# Patient Record
Sex: Male | Born: 1955 | Race: White | Hispanic: No | Marital: Married | Smoking: Former smoker
Health system: Southern US, Community
[De-identification: ages and names within clinical notes are randomized; demographics above are authoritative.]

## PROBLEM LIST (undated history)

## (undated) DIAGNOSIS — E119 Type 2 diabetes mellitus without complications: Secondary | ICD-10-CM

## (undated) DIAGNOSIS — I219 Acute myocardial infarction, unspecified: Secondary | ICD-10-CM

## (undated) DIAGNOSIS — I2510 Atherosclerotic heart disease of native coronary artery without angina pectoris: Secondary | ICD-10-CM

## (undated) DIAGNOSIS — I509 Heart failure, unspecified: Secondary | ICD-10-CM

## (undated) DIAGNOSIS — M1990 Unspecified osteoarthritis, unspecified site: Secondary | ICD-10-CM

## (undated) DIAGNOSIS — G4730 Sleep apnea, unspecified: Secondary | ICD-10-CM

## (undated) DIAGNOSIS — E785 Hyperlipidemia, unspecified: Secondary | ICD-10-CM

## (undated) DIAGNOSIS — K219 Gastro-esophageal reflux disease without esophagitis: Secondary | ICD-10-CM

## (undated) DIAGNOSIS — I4891 Unspecified atrial fibrillation: Secondary | ICD-10-CM

## (undated) DIAGNOSIS — I10 Essential (primary) hypertension: Secondary | ICD-10-CM

## (undated) DIAGNOSIS — E669 Obesity, unspecified: Secondary | ICD-10-CM

## (undated) HISTORY — DX: Essential (primary) hypertension: I10

## (undated) HISTORY — DX: Gastro-esophageal reflux disease without esophagitis: K21.9

## (undated) HISTORY — DX: Acute myocardial infarction, unspecified: I21.9

## (undated) HISTORY — PX: CARDIAC CATHETERIZATION: SHX172

## (undated) HISTORY — DX: Sleep apnea, unspecified: G47.30

## (undated) HISTORY — PX: CORONARY STENT PLACEMENT: SHX1402

## (undated) HISTORY — DX: Atherosclerotic heart disease of native coronary artery without angina pectoris: I25.10

## (undated) HISTORY — DX: Type 2 diabetes mellitus without complications: E11.9

## (undated) HISTORY — DX: Hyperlipidemia, unspecified: E78.5

## (undated) HISTORY — DX: Obesity, unspecified: E66.9

---

## 2003-12-26 ENCOUNTER — Inpatient Hospital Stay (HOSPITAL_COMMUNITY): Admission: EM | Admit: 2003-12-26 | Discharge: 2004-01-01 | Payer: Self-pay | Admitting: Cardiology

## 2003-12-29 ENCOUNTER — Encounter: Payer: Self-pay | Admitting: Cardiology

## 2004-11-13 ENCOUNTER — Ambulatory Visit: Payer: Self-pay | Admitting: Cardiology

## 2005-06-06 ENCOUNTER — Ambulatory Visit: Payer: Self-pay | Admitting: Cardiology

## 2006-02-11 ENCOUNTER — Ambulatory Visit: Payer: Self-pay | Admitting: Cardiology

## 2007-02-16 ENCOUNTER — Ambulatory Visit: Payer: Self-pay | Admitting: Cardiology

## 2007-02-16 LAB — CONVERTED CEMR LAB
Alkaline Phosphatase: 22 units/L — ABNORMAL LOW (ref 39–117)
BUN: 20 mg/dL (ref 6–23)
Chloride: 103 meq/L (ref 96–112)
Cholesterol: 165 mg/dL (ref 0–200)
Creatinine, Ser: 1.6 mg/dL — ABNORMAL HIGH (ref 0.4–1.5)
GFR calc Af Amer: 59 mL/min
Potassium: 4.4 meq/L (ref 3.5–5.1)
Sodium: 140 meq/L (ref 135–145)
Total Protein: 7 g/dL (ref 6.0–8.3)
Triglycerides: 188 mg/dL — ABNORMAL HIGH (ref 0–149)

## 2007-03-16 ENCOUNTER — Ambulatory Visit: Payer: Self-pay

## 2007-07-16 ENCOUNTER — Ambulatory Visit: Payer: Self-pay | Admitting: Cardiology

## 2007-08-06 ENCOUNTER — Ambulatory Visit: Payer: Self-pay | Admitting: Cardiology

## 2008-05-23 ENCOUNTER — Ambulatory Visit: Payer: Self-pay | Admitting: Cardiology

## 2008-06-03 DIAGNOSIS — E785 Hyperlipidemia, unspecified: Secondary | ICD-10-CM | POA: Insufficient documentation

## 2008-06-03 DIAGNOSIS — E119 Type 2 diabetes mellitus without complications: Secondary | ICD-10-CM | POA: Insufficient documentation

## 2008-06-03 DIAGNOSIS — I2510 Atherosclerotic heart disease of native coronary artery without angina pectoris: Secondary | ICD-10-CM | POA: Insufficient documentation

## 2008-06-03 DIAGNOSIS — I10 Essential (primary) hypertension: Secondary | ICD-10-CM

## 2008-06-03 DIAGNOSIS — K219 Gastro-esophageal reflux disease without esophagitis: Secondary | ICD-10-CM

## 2008-10-04 ENCOUNTER — Ambulatory Visit: Payer: Self-pay | Admitting: Cardiology

## 2008-10-27 ENCOUNTER — Telehealth (INDEPENDENT_AMBULATORY_CARE_PROVIDER_SITE_OTHER): Payer: Self-pay | Admitting: *Deleted

## 2008-11-10 ENCOUNTER — Telehealth (INDEPENDENT_AMBULATORY_CARE_PROVIDER_SITE_OTHER): Payer: Self-pay | Admitting: *Deleted

## 2008-11-14 ENCOUNTER — Ambulatory Visit: Payer: Self-pay | Admitting: Cardiology

## 2008-12-01 ENCOUNTER — Telehealth (INDEPENDENT_AMBULATORY_CARE_PROVIDER_SITE_OTHER): Payer: Self-pay | Admitting: Radiology

## 2008-12-05 ENCOUNTER — Encounter: Payer: Self-pay | Admitting: Cardiology

## 2008-12-05 ENCOUNTER — Ambulatory Visit: Payer: Self-pay

## 2008-12-05 ENCOUNTER — Ambulatory Visit: Payer: Self-pay | Admitting: Cardiology

## 2008-12-07 ENCOUNTER — Encounter: Payer: Self-pay | Admitting: Cardiology

## 2008-12-07 LAB — CONVERTED CEMR LAB
ALT: 22 units/L (ref 0–53)
AST: 24 units/L (ref 0–37)
Albumin: 3.8 g/dL (ref 3.5–5.2)
Alkaline Phosphatase: 24 units/L — ABNORMAL LOW (ref 39–117)
Bilirubin, Direct: 0.2 mg/dL (ref 0.0–0.3)
HDL: 28.5 mg/dL — ABNORMAL LOW (ref >39.00)
Potassium: 4.4 meq/L (ref 3.5–5.1)
Total Bilirubin: 0.9 mg/dL (ref 0.3–1.2)
Total CHOL/HDL Ratio: 5
Total Protein: 7.5 g/dL (ref 6.0–8.3)

## 2008-12-22 DIAGNOSIS — G4730 Sleep apnea, unspecified: Secondary | ICD-10-CM

## 2008-12-22 DIAGNOSIS — R609 Edema, unspecified: Secondary | ICD-10-CM

## 2008-12-22 DIAGNOSIS — R0602 Shortness of breath: Secondary | ICD-10-CM

## 2009-07-19 ENCOUNTER — Telehealth: Payer: Self-pay | Admitting: Cardiology

## 2009-08-22 ENCOUNTER — Encounter (INDEPENDENT_AMBULATORY_CARE_PROVIDER_SITE_OTHER): Payer: Self-pay | Admitting: *Deleted

## 2009-09-04 ENCOUNTER — Telehealth: Payer: Self-pay | Admitting: Cardiology

## 2009-12-06 ENCOUNTER — Telehealth: Payer: Self-pay | Admitting: Cardiology

## 2010-02-05 ENCOUNTER — Ambulatory Visit: Payer: Self-pay | Admitting: Cardiology

## 2010-03-02 ENCOUNTER — Telehealth: Payer: Self-pay | Admitting: Cardiology

## 2010-04-26 ENCOUNTER — Telehealth: Payer: Self-pay | Admitting: Cardiology

## 2010-05-04 ENCOUNTER — Ambulatory Visit: Payer: Self-pay | Admitting: Cardiology

## 2010-05-04 DIAGNOSIS — R079 Chest pain, unspecified: Secondary | ICD-10-CM

## 2010-05-07 ENCOUNTER — Telehealth: Payer: Self-pay | Admitting: Cardiology

## 2010-07-25 ENCOUNTER — Telehealth: Payer: Self-pay | Admitting: Cardiology

## 2010-07-31 ENCOUNTER — Ambulatory Visit: Admit: 2010-07-31 | Payer: Self-pay | Admitting: Cardiology

## 2010-08-28 NOTE — Assessment & Plan Note (Signed)
Summary: rov/ gd   History of Present Illness: 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  I last saw him in March of 2010.  Last Myoview in May of 2010 showed an ejection fraction of 44%. There was a prior anterior infarct with trivial peri-infarct ischemia. Echocardiogram May 2010 was technically difficult. The LV function was felt to be 65-70%. Since he was last seen the patient denies any dyspnea on exertion, orthopnea, PND, pedal edema, palpitations, syncope or chest pain.   Current Medications (verified): 1)  Avapro 150 Mg Tabs (Irbesartan) .... Take 1 Tablet By Mouth Once A Day 2)  Crestor 40 Mg Tabs (Rosuvastatin Calcium) .... Take One Tablet By Mouth Daily. 3)  Nitroglycerin 0.4 Mg Subl (Nitroglycerin) .... Place 1 Tablet Under Tongue As Directed 4)  Toprol Xl 25 Mg Xr24h-Tab (Metoprolol Succinate) .... Take 1 Tablet By Mouth Once A Day 5)  Tricor 145 Mg Tabs (Fenofibrate) .... Take 1 Tablet By Mouth Once A Day 6)  Metformin Hcl 500 Mg Tabs (Metformin Hcl) .... 2 Tabs By Mouth Two Times A Day 7)  Aspirin Ec 325 Mg Tbec (Aspirin) .... Take One Tablet By Mouth Daily 8)  Novolin 70/30 70-30 % Susp (Insulin Isophane & Regular) .... 30 Units Two Times A Day 9)  Novolin N 100 Unit/ml Susp (Insulin Isophane Human) .... Sliding Scale  Allergies: 1)  ! * Plavix 2)  Codeine  Past History:  Past Medical History: CAD (ICD-414.00) GERD (ICD-530.81) DM (ICD-250.00) HYPERLIPIDEMIA (ICD-272.4) HYPERTENSION (ICD-401.9) SLEEP APNEA (ICD-780.57) OBESITY (ICD-278.00) H/O plavix allergy  Past Surgical History: Reviewed history from 12/22/2008 and no changes required.  Dr. Gerri Spore performed drug-  eluting stent placement in the culprit lesion of the LAD.  He also had a  severe stenosis of the proximal RCA.  Patient now returns for planned staged  intervention. 12/30/2003   Left heart  catheterization with coronary angiography, leftventriculography, and abdominal aortography.  Percutaneous transluminal coronary angioplasty with stent placement x2 in  the proximal and mid-left anterior descending artery.Carole Binning, M.D. [facility]  RESULTS:  Successful percutaneous transluminal coronary angioplasty with stent placement x2 in the proximal and mid-left anterior descending artery. A 95% stenosis in the proximal vessel with probable thrombus and a 90%  stenosis in the midvessel were both reduced to 0% residual with TIMI-3 flow.  Social History: Reviewed history from 12/22/2008 and no changes required.  He lives in [place] with his wife and is on disability  secondary to hypertension and anxiety. Former tobacco abuse.  Review of Systems       no fevers or chills, productive cough, hemoptysis, dysphasia, odynophagia, melena, hematochezia, dysuria, hematuria, rash, seizure activity, orthopnea, PND, pedal edema, claudication. Remaining systems are negative.   Vital Signs:  Patient profile:   55 year old male Height:      73 inches Weight:      313 pounds BMI:     41.44 Pulse rate:   76 / minute Resp:     14 per minute BP sitting:   138 / 84  (left arm)  Vitals Entered By: Kem Parkinson (February 05, 2010 1:46 PM)  Physical Exam  General:  Well-developed obese in no acute distress.  Skin is warm and dry.  HEENT is normal.  Neck is supple. No thyromegaly.  Chest is clear to auscultation with normal expansion.  Cardiovascular exam is regular rate and  rhythm.  Abdominal exam nontender or distended. No masses palpated. Extremities show trace edema. neuro grossly intact    EKG  Procedure date:  02/05/2010  Findings:      Normal sinus rhythm at a rate of 76. Axis normal. No ST changes.  Impression & Recommendations:  Problem # 1:  CAD (ICD-414.00) Continue aspirin, beta blocker, ACE inhibitor and statin. Last Myoview low risk. Continue medical therapy. His updated  medication list for this problem includes:    Nitroglycerin 0.4 Mg Subl (Nitroglycerin) .Marland Kitchen... Place 1 tablet under tongue as directed    Toprol Xl 25 Mg Xr24h-tab (Metoprolol succinate) .Marland Kitchen... Take 1 tablet by mouth once a day    Aspirin Ec 325 Mg Tbec (Aspirin) .Marland Kitchen... Take one tablet by mouth daily  Problem # 2:  DM (ICD-250.00) Management per primary care. The following medications were removed from the medication list:    Diovan 160 Mg Tabs (Valsartan) .Marland Kitchen... Take 1 tablet by mouth once a day His updated medication list for this problem includes:    Avapro 150 Mg Tabs (Irbesartan) .Marland Kitchen... Take 1 tablet by mouth once a day    Metformin Hcl 500 Mg Tabs (Metformin hcl) .Marland Kitchen... 2 tabs by mouth two times a day    Aspirin Ec 325 Mg Tbec (Aspirin) .Marland Kitchen... Take one tablet by mouth daily    Novolin 70/30 70-30 % Susp (Insulin isophane & regular) .Marland KitchenMarland KitchenMarland KitchenMarland Kitchen 30 units two times a day    Novolin N 100 Unit/ml Susp (Insulin isophane human) ..... Sliding scale  Problem # 3:  HYPERLIPIDEMIA (ICD-272.4) Continue present medications. Lipids and liver monitored by primary care. His updated medication list for this problem includes:    Crestor 40 Mg Tabs (Rosuvastatin calcium) .Marland Kitchen... Take one tablet by mouth daily.    Tricor 145 Mg Tabs (Fenofibrate) .Marland Kitchen... Take 1 tablet by mouth once a day  Problem # 4:  HYPERTENSION (ICD-401.9) Blood pressure controlled on present medications. Will continue. Renal function and potassium monitored by primary care. The following medications were removed from the medication list:    Diovan 160 Mg Tabs (Valsartan) .Marland Kitchen... Take 1 tablet by mouth once a day    Furosemide 40 Mg Tabs (Furosemide) .Marland Kitchen... Take 1 tablet by mouth once a day His updated medication list for this problem includes:    Avapro 150 Mg Tabs (Irbesartan) .Marland Kitchen... Take 1 tablet by mouth once a day    Toprol Xl 25 Mg Xr24h-tab (Metoprolol succinate) .Marland Kitchen... Take 1 tablet by mouth once a day    Aspirin Ec 325 Mg Tbec (Aspirin)  .Marland Kitchen... Take one tablet by mouth daily  Problem # 5:  OBESITY (ICD-278.00) Discussed the importance of diet and exercise.  Patient Instructions: 1)  Your physician recommends that you schedule a follow-up appointment in:ONE YEAR

## 2010-08-28 NOTE — Progress Notes (Signed)
Summary: problem with med  Phone Note Call from Patient   Caller: Patient Reason for Call: Talk to Nurse Summary of Call: pt wants to talk to nurse re problems with med-pls call [phone] Initial call taken by: Glynda Jaeger,  March 02, 2010 12:53 PM  Follow-up for Phone Call        Phone Call Completed Deliah Goody, RN  March 05, 2010 3:15 PM     Prescriptions: TRICOR 145 MG TABS (FENOFIBRATE) Take 1 tablet by mouth once a day  #30 x 3   Entered by:   Deliah Goody, RN   Authorized by:   Ferman Hamming, MD, [facility]   Signed by:   Deliah Goody, RN on 03/05/2010   Method used:   Faxed to ...       Hospital doctor (retail)       [address]       [place], [place]  [address]       Ph: [phone] or [phone]       Fax: [phone]   RxID:   [id number] TOPROL XL 25 MG XR24H-TAB (METOPROLOL SUCCINATE) Take 1 tablet by mouth once a day  #30 x 3   Entered by:   Deliah Goody, RN   Authorized by:   Ferman Hamming, MD, [facility]   Signed by:   Deliah Goody, RN on 03/05/2010   Method used:   Faxed to ...       Hospital doctor (retail)       [address]       [place], [place]  [address]       Ph: [phone] or [phone]       Fax: [phone]   RxID:   [id number] NITROGLYCERIN 0.4 MG SUBL (NITROGLYCERIN) Place 1 tablet under tongue as directed  #30 x 12   Entered by:   Deliah Goody, RN   Authorized by:   Ferman Hamming, MD, [facility]   Signed by:   Deliah Goody, RN on 03/05/2010   Method used:   Faxed to ...       Hospital doctor (retail)       [address]       [place], [place]  [address]       Ph: [phone] or [phone]       Fax: [phone]   RxID:   [id number] CRESTOR 40 MG TABS (ROSUVASTATIN CALCIUM) Take one tablet by mouth daily.  #30 x 1   Entered by:   Deliah Goody, RN   Authorized by:   Ferman Hamming, MD,  [facility]   Signed by:   Deliah Goody, RN on 03/05/2010   Method used:   Faxed to ...       Hospital doctor (retail)       [address]       [place], [place]  [address]       Ph: [phone] or [phone]       Fax: [phone]   RxID:   [id number] AVAPRO 150 MG TABS (IRBESARTAN) Take 1 tablet by mouth once a day  #30 x 12   Entered by:   Deliah Goody, RN   Authorized by:   Ferman Hamming, MD, [facility]   Signed by:  Deliah Goody, RN on 03/05/2010   Method used:   Faxed to ...       Hospital doctor (retail)       [address]       [place], [place]  [address]       Ph: [phone] or [phone]       Fax: [phone]   RxID:   [id number]

## 2010-08-28 NOTE — Letter (Signed)
Summary: Appointment - Missed  [facility]     [place], [place]    Phone:   Fax:      August 22, 2009 MRN: [id number]   Lawrence Delgado [address] [place], [place]  [address]   Dear [name],  Our records indicate you missed your appointment on  08-18-2009   with  Dr.   Crenshaw   It is very important that we reach you to reschedule this appointment. We look forward to participating in your health care needs. Please contact us at the number listed above at your earliest convenience to reschedule this appointment.     Sincerely,   Gesila Davis  [facility] Scheduling Team 

## 2010-08-28 NOTE — Letter (Signed)
Summary: Appointment - Missed  [facility]     [place], [place]    Phone:   Fax:      August 22, 2009 MRN: [id number]   Lawrence Delgado [address] [place], [place]  [address]   Dear Mr. Barsky,  Our records indicate you missed your appointment on  08-18-2009   with  Dr.   Jens Som   It is very important that we reach you to reschedule this appointment. We look forward to participating in your health care needs. Please contact us at the number listed above at your earliest convenience to reschedule this appointment.     Sincerely,   Lorne Skeens  [facility] Scheduling Team

## 2010-08-28 NOTE — Assessment & Plan Note (Signed)
Summary: rov/chest pain/dm   CC:  chest pain.  History of Present Illness: 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  I last saw him in July of 2011.  Last Myoview in May of 2010 showed an ejection fraction of 44%. There was a prior anterior infarct with trivial peri-infarct ischemia. Echocardiogram May 2010 was technically difficult. The LV function was felt to be 65-70%. Since he was last seen he states approximately one week ago he had chest pain. It was described as a tingle for one to 2 seconds. It occurred with stress. There was no associated nausea, vomiting, shortness of breath or diaphoresis. He was concerned and asked to be evaluated. He otherwise denies dyspnea on exertion, orthopnea, PND, pedal edema or exertional chest pain.  Current Medications (verified): 1)  Avapro 150 Mg Tabs (Irbesartan) .... Take 1 Tablet By Mouth Once A Day 2)  Crestor 40 Mg Tabs (Rosuvastatin Calcium) .... Take One Tablet By Mouth Daily. 3)  Nitroglycerin 0.4 Mg Subl (Nitroglycerin) .... Place 1 Tablet Under Tongue As Directed 4)  Toprol Xl 25 Mg Xr24h-Tab (Metoprolol Succinate) .... Take 1 Tablet By Mouth Once A Day 5)  Tricor 145 Mg Tabs (Fenofibrate) .... Take 1 Tablet By Mouth Once A Day 6)  Metformin Hcl 500 Mg Tabs (Metformin Hcl) .... 2 Tabs By Mouth Two Times A Day 7)  Aspirin Ec 325 Mg Tbec (Aspirin) .... Take One Tablet By Mouth Daily 8)  Novolin 70/30 70-30 % Susp (Insulin Isophane & Regular) .... 70/40 Pen  As Directed 9)  Novolin N 100 Unit/ml Susp (Insulin Isophane Human) .... Sliding Scale  Allergies: 1)  ! * Plavix 2)  Codeine  Past History:  Past Medical History: Reviewed history from 02/05/2010 and no changes required. CAD (ICD-414.00) GERD (ICD-530.81) DM (ICD-250.00) HYPERLIPIDEMIA (ICD-272.4) HYPERTENSION (ICD-401.9) SLEEP APNEA (ICD-780.57) OBESITY (ICD-278.00) H/O plavix  allergy  Past Surgical History: Reviewed history from 12/22/2008 and no changes required.  Dr. Gerri Spore performed drug-  eluting stent placement in the culprit lesion of the LAD.  He also had a  severe stenosis of the proximal RCA.  Patient now returns for planned staged  intervention. 12/30/2003   Left heart catheterization with coronary angiography, leftventriculography, and abdominal aortography.  Percutaneous transluminal coronary angioplasty with stent placement x2 in  the proximal and mid-left anterior descending artery.Carole Binning, M.D. [facility]  RESULTS:  Successful percutaneous transluminal coronary angioplasty with stent placement x2 in the proximal and mid-left anterior descending artery. A 95% stenosis in the proximal vessel with probable thrombus and a 90%  stenosis in the midvessel were both reduced to 0% residual with TIMI-3 flow.  Social History: Reviewed history from 02/05/2010 and no changes required.  He lives in [place] with his wife and is on disability  secondary to hypertension and anxiety. Former tobacco abuse.  Review of Systems       no fevers or chills, productive cough, hemoptysis, dysphasia, odynophagia, melena, hematochezia, dysuria, hematuria, rash, seizure activity, orthopnea, PND, pedal edema, claudication. Remaining systems are negative.   Vital Signs:  Patient profile:   55 year old male Height:      73 inches Weight:      308 pounds BMI:     40.78 Pulse rate:   74 / minute Resp:     14 per minute BP sitting:   145 / 84  (left arm)  Vitals  Entered By: Kem Parkinson (May 04, 2010 9:43 AM)  Physical Exam  General:  Well-developed well-nourished in no acute distress.  Skin is warm and dry.  HEENT is normal.  Neck is supple. No thyromegaly.  Chest is clear to auscultation with normal expansion.  Cardiovascular exam is regular rate and rhythm.  Abdominal exam nontender or distended. No masses palpated. Extremities show no edema. neuro  grossly intact    EKG  Procedure date:  05/04/2010  Findings:      Normal sinus rhythm rate of 74. No ST changes.  Impression & Recommendations:  Problem # 1:  CHEST PAIN (ICD-786.50) Symptoms atypical and not consistent with cardiac pain. No further workup at this time. His updated medication list for this problem includes:    Nitroglycerin 0.4 Mg Subl (Nitroglycerin) .Marland Kitchen... Place 1 tablet under tongue as directed    Toprol Xl 25 Mg Xr24h-tab (Metoprolol succinate) .Marland Kitchen... Take 1 tablet by mouth once a day    Aspirin Ec 325 Mg Tbec (Aspirin) .Marland Kitchen... Take one tablet by mouth daily  Problem # 2:  CAD (ICD-414.00) Continue aspirin, beta blocker and statin. His updated medication list for this problem includes:    Nitroglycerin 0.4 Mg Subl (Nitroglycerin) .Marland Kitchen... Place 1 tablet under tongue as directed    Toprol Xl 25 Mg Xr24h-tab (Metoprolol succinate) .Marland Kitchen... Take 1 tablet by mouth once a day    Aspirin Ec 325 Mg Tbec (Aspirin) .Marland Kitchen... Take one tablet by mouth daily  Problem # 3:  DM (ICD-250.00)  His updated medication list for this problem includes:    Avapro 150 Mg Tabs (Irbesartan) .Marland Kitchen... Take 1 tablet by mouth once a day    Metformin Hcl 500 Mg Tabs (Metformin hcl) .Marland Kitchen... 2 tabs by mouth two times a day    Aspirin Ec 325 Mg Tbec (Aspirin) .Marland Kitchen... Take one tablet by mouth daily    Novolin 70/30 70-30 % Susp (Insulin isophane & regular) .Marland KitchenMarland Kitchen.. 70/40 pen  as directed    Novolin N 100 Unit/ml Susp (Insulin isophane human) ..... Sliding scale  Problem # 4:  HYPERLIPIDEMIA (ICD-272.4) Continue present medications. His updated medication list for this problem includes:    Crestor 40 Mg Tabs (Rosuvastatin calcium) .Marland Kitchen... Take one tablet by mouth daily.    Tricor 145 Mg Tabs (Fenofibrate) .Marland Kitchen... Take 1 tablet by mouth once a day  Problem # 5:  HYPERTENSION (ICD-401.9) Blood pressure reasonable at present. Continue present medications. His updated medication list for this problem includes:     Avapro 150 Mg Tabs (Irbesartan) .Marland Kitchen... Take 1 tablet by mouth once a day    Toprol Xl 25 Mg Xr24h-tab (Metoprolol succinate) .Marland Kitchen... Take 1 tablet by mouth once a day    Aspirin Ec 325 Mg Tbec (Aspirin) .Marland Kitchen... Take one tablet by mouth daily  Problem # 6:  SLEEP APNEA (ICD-780.57)  Problem # 7:  GERD (ICD-530.81)  Patient Instructions: 1)  Your physician recommends that you schedule a follow-up appointment in: 3 MONTHS

## 2010-08-28 NOTE — Progress Notes (Signed)
Summary: had chest pains yesterday wants a call asap  Phone Note Call from Patient   Caller: Patient Reason for Call: Talk to Nurse Summary of Call: pt had chest pains on both sides of his chest yesterday, took a nitro and felt better-wants a call asap [phone] or [phone] Initial call taken by: Glynda Jaeger,  April 26, 2010 1:01 PM  Follow-up for Phone Call        spoke with pt, he had an episode of discomfort under his left armpit last night after getting very stressed about some mail he received. his blood sugar was also high. this discomfort last a few seconds, went away and then returned. he took one NTG and the discomfort went away and did not come back until this am. while resting this am he got the discomfort in his right chest and has had the discomfort off and on all day. last night he broke out in a sweat with the discomfort but today he has had no other symptoms with the discomfort. denies SOB. this is a different type of discomfort than when he has had heart pain. he has noticed belching after eating and an acid taste that will come up in his throat. he has not tried any antiacids. there is no pain with movement. pt is pain free at present and will foward to dr Jens Som for his review Deliah Goody, RN  April 26, 2010 3:38 PM   Additional Follow-up for Phone Call Additional follow up Details #1::        schedule ov; ER if pain returns or worsens Ferman Hamming, MD, [facility]  April 26, 2010 5:05 PM  pt aware, follow up scheduled Deliah Goody, RN  April 26, 2010 6:43 PM

## 2010-08-28 NOTE — Progress Notes (Signed)
Summary: refill  Phone Note Refill Request   Refills Requested: Medication #1:  NITROGLYCERIN 0.4 MG SUBL Place 1 tablet under tongue as directed   Supply Requested: 1 year [facility] [phone]   Method Requested: Fax to Local Pharmacy Initial call taken by: Migdalia Dk,  Dec 06, 2009 1:41 PM    Prescriptions: NITROGLYCERIN 0.4 MG SUBL (NITROGLYCERIN) Place 1 tablet under tongue as directed  #30 x 12   Entered by:   Kem Parkinson   Authorized by:   Ferman Hamming, MD, [facility]   Signed by:   Kem Parkinson on 12/06/2009   Method used:   Faxed to ...       Hospital doctor (retail)       [address]       [place], [place]  [address]       Ph: [phone] or [phone]       Fax: [phone]   RxID:   [id number]

## 2010-08-28 NOTE — Progress Notes (Signed)
Summary: refill request  Phone Note Refill Request Message from:  Patient on May 07, 2010 9:23 AM  Refills Requested: Medication #1:  CRESTOR 40 MG TABS Take one tablet by mouth daily. [facility]   Method Requested: Telephone to Pharmacy Initial call taken by: Glynda Jaeger,  May 07, 2010 9:23 AM Caller: Spouse    Prescriptions: CRESTOR 40 MG TABS (ROSUVASTATIN CALCIUM) Take one tablet by mouth daily.  #30 x 12   Entered by:   Kem Parkinson   Authorized by:   Ferman Hamming, MD, [facility]   Signed by:   Kem Parkinson on 05/07/2010   Method used:   Faxed to ...       Hospital doctor (retail)       [address]       [place], [place]  [address]       Ph: [phone] or [phone]       Fax: [phone]   RxID:   [id number]

## 2010-08-28 NOTE — Progress Notes (Signed)
Summary: REFILL  Phone Note Refill Request Message from:  Patient on September 04, 2009 10:29 AM  Refills Requested: Medication #1:  AVAPRO 150 MG TABS Take 1 tablet by mouth once a day [facility] [phone]  Initial call taken by: Judie Grieve,  September 04, 2009 10:30 AM    Prescriptions: AVAPRO 150 MG TABS (IRBESARTAN) Take 1 tablet by mouth once a day  #30 x 12   Entered by:   Kem Parkinson   Authorized by:   Ferman Hamming, MD, [facility]   Signed by:   Kem Parkinson on 09/04/2009   Method used:   Faxed to ...       Hospital doctor (retail)       [address]       [place], [place]  [address]       Ph: [phone] or [phone]       Fax: [phone]   RxID:   [id number]

## 2010-08-30 NOTE — Progress Notes (Signed)
Summary: refill  Phone Note Refill Request Message from:  Patient on July 25, 2010 2:28 PM  Refills Requested: Medication #1:  TOPROL XL 25 MG XR24H-TAB Take 1 tablet by mouth once a day  Medication #2:  TRICOR 145 MG TABS Take 1 tablet by mouth once a day [facility]  [phone] pt need today out of medication  Initial call taken by: Judie Grieve,  July 25, 2010 2:29 PM    Prescriptions: TRICOR 145 MG TABS (FENOFIBRATE) Take 1 tablet by mouth once a day  #30 x 3   Entered by:   Kem Parkinson   Authorized by:   Ferman Hamming, MD, [facility]   Signed by:   Kem Parkinson on 07/25/2010   Method used:   Faxed to ...       Hospital doctor (retail)       [address]       [place], [place]  [address]       Ph: [phone] or [phone]       Fax: [phone]   RxID:   [id number] TOPROL XL 25 MG XR24H-TAB (METOPROLOL SUCCINATE) Take 1 tablet by mouth once a day  #30 x 3   Entered by:   Kem Parkinson   Authorized by:   Ferman Hamming, MD, [facility]   Signed by:   Kem Parkinson on 07/25/2010   Method used:   Faxed to ...       Hospital doctor (retail)       [address]       [place], [place]  [address]       Ph: [phone] or [phone]       Fax: [phone]   RxID:   [id number]

## 2010-09-06 ENCOUNTER — Ambulatory Visit: Payer: Self-pay | Admitting: Cardiology

## 2010-09-13 ENCOUNTER — Ambulatory Visit: Payer: MEDICARE | Admitting: Cardiology

## 2010-10-09 ENCOUNTER — Encounter: Payer: Self-pay | Admitting: Cardiology

## 2010-10-12 ENCOUNTER — Ambulatory Visit: Payer: MEDICARE | Admitting: Cardiology

## 2010-10-17 ENCOUNTER — Telehealth: Payer: Self-pay | Admitting: Cardiology

## 2010-10-17 DIAGNOSIS — I2510 Atherosclerotic heart disease of native coronary artery without angina pectoris: Secondary | ICD-10-CM

## 2010-10-17 MED ORDER — METOPROLOL SUCCINATE ER 25 MG PO TB24: 25.0000 mg | ORAL_TABLET | Freq: Every day | ORAL | Status: DC

## 2010-10-17 NOTE — Telephone Encounter (Signed)
Refilled medication

## 2010-10-23 ENCOUNTER — Ambulatory Visit: Payer: MEDICARE | Admitting: Cardiology

## 2010-10-24 ENCOUNTER — Ambulatory Visit: Payer: MEDICARE | Admitting: Cardiology

## 2010-10-26 ENCOUNTER — Encounter: Payer: Self-pay | Admitting: Cardiology

## 2010-11-14 ENCOUNTER — Encounter: Payer: Self-pay | Admitting: Cardiology

## 2010-11-21 ENCOUNTER — Encounter: Payer: Self-pay | Admitting: Cardiology

## 2010-11-22 ENCOUNTER — Ambulatory Visit: Payer: MEDICARE | Admitting: Cardiology

## 2010-11-23 ENCOUNTER — Encounter: Payer: Self-pay | Admitting: Cardiology

## 2010-11-26 ENCOUNTER — Encounter: Payer: MEDICARE | Admitting: Cardiology

## 2010-11-26 NOTE — Progress Notes (Signed)
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 Last Myoview in May of 2010 showed an ejection fraction of 44%. There was a prior anterior infarct with trivial peri-infarct ischemia. Echocardiogram May 2010 was technically difficult. The LV function was felt to be 65-70%. I last saw him in Oct of 2011. Since then,   Current Outpatient Prescriptions  Medication Sig Dispense Refill  . aspirin 325 MG tablet Take 325 mg by mouth daily.        . fenofibrate (TRICOR) 145 MG tablet Take 145 mg by mouth daily.        . insulin aspart (NOVOLOG) 100 UNIT/ML injection Sliding scale       . insulin NPH-insulin regular (NOVOLIN 70/30) (70-30) 100 UNIT/ML injection as directed.        . irbesartan (AVAPRO) 150 MG tablet Take 150 mg by mouth at bedtime.        . metFORMIN (GLUCOPHAGE) 500 MG tablet Take 1,000 mg by mouth 2 (two) times daily with a meal.        . metoprolol succinate (TOPROL-XL) 25 MG 24 hr tablet Take 1 tablet (25 mg total) by mouth daily.  30 tablet  11  . nitroGLYCERIN (NITROSTAT) 0.4 MG SL tablet Place 0.4 mg under the tongue every 5 (five) minutes as needed.        . rosuvastatin (CRESTOR) 40 MG tablet Take 40 mg by mouth daily.           Past Medical History  Diagnosis Date  . CAD (coronary artery disease)   . GERD (gastroesophageal reflux disease)   . DM (diabetes mellitus)   . HLD (hyperlipidemia)   . HTN (hypertension)   . Sleep apnea   . Obesity     Past Surgical History  Procedure Date  . Coronary stent placement     eluting stent placement in the culpric lesion of the LAD  . Cardiac catheterization     with coronary angiography     History   Social History  . Marital Status: Married    Spouse Name: N/A    Number of Children: N/A  . Years of Education: N/A   Occupational History  . Not on file.   Social History Main Topics  . Smoking status: Former Games developer  .  Smokeless tobacco: Not on file  . Alcohol Use: Not on file  . Drug Use: Not on file  . Sexually Active: Not on file   Other Topics Concern  . Not on file   Social History Narrative  . No narrative on file    ROS: no fevers or chills, productive cough, hemoptysis, dysphasia, odynophagia, melena, hematochezia, dysuria, hematuria, rash, seizure activity, orthopnea, PND, pedal edema, claudication. Remaining systems are negative.  Physical Exam: Well-developed well-nourished in no acute distress.  Skin is warm and dry.  HEENT is normal.  Neck is supple. No thyromegaly.  Chest is clear to auscultation with normal expansion.  Cardiovascular exam is regular rate and rhythm.  Abdominal exam nontender or distended. No masses palpated. Extremities show no edema. neuro grossly intact  ECG     This encounter was created in error - please disregard.

## 2010-12-04 ENCOUNTER — Encounter: Payer: Self-pay | Admitting: Cardiology

## 2010-12-11 NOTE — Assessment & Plan Note (Signed)
[facility]                            CARDIOLOGY OFFICE NOTE   NAME:Delgado, Lawrence BERTZ                      MRN:          [id number]  DATE:05/23/2008                            DOB:          07-29-1956    HISTORY OF PRESENT ILLNESS:  [name] is a pleasant 55 year old  gentleman who had a prior anterior infarct with prior PCI of his LAD as  well as his right coronary artery.  His most recent Myoview was  performed on March 16, 2007.  At that time, the ejection fraction was  42%.  There was a prior anterior infarct with very mild periinfarct  ischemia towards the apex.  We have been treating him medically.  Since  I last saw him, he apparently has had increased dyspnea on exertion.  This does not occur at rest.  There is no orthopnea or PND, but he has  had worsening pedal edema and he has gained 24 pounds since I last saw  him.  He saw a physician concerning for the pain recently and he was  noted to have increased edema and we were asked to evaluate him.  Note,  he has not had chest pain, palpitations, or syncope.   MEDICATIONS:  1. Aspirin 325 mg p.o. daily.  2. Toprol 25 mg p.o. daily.  3. TriCor 145 mg p.o. daily.  4. Avapro 150 mg p.o. daily.  5. Glimepiride-Duetact combination.  6. Crestor 20 mg p.o. daily.  7. Vitamin D.   PHYSICAL EXAMINATION:  VITAL SIGNS:  Blood pressure of 125/83 and his  pulse is 73.  He weighs is 215 pounds.  HEENT:  Normal.  NECK:  Supple.  CHEST:  Clear.  CARDIOVASCULAR:  Regular rate and rhythm.  ABDOMEN:  No tenderness.  EXTREMITIES:  1-2+ edema.   Electrocardiogram shows sinus rhythm at a rate of 70.  The axis is  normal.  Prior septal infarct cannot be excluded.   DIAGNOSES:  1. Dyspnea on exertion/edema - the patient appears to be volume      overloaded on exam today.  We will add Lasix 40 mg p.o. daily and      potassium 20 mEq p.o. daily.  I will check a BMET in 1 week to      follow his potassium  and renal function.  We will also plan to      repeat his echocardiogram.  2. Coronary artery disease status post PCI of his LAD and right      coronary artery - he has not had chest pain and his Myoview      approximately one year ago was low risk.  We will continue with his      aspirin, beta-blocker, ARB, and statin.  3. Hypertension - his blood pressure is adequately controlled on his      present medications.  4. Hyperlipidemia - we will check lipids and liver and adjust as      indicated.  He will continue his Crestor and his TriCor.  5. Diabetes mellitus.  6. History of allergy to Plavix.  7. Gastroesophageal reflux  disease.   PLAN:  I discussed risk factor modification with [name].  He needs  to lose a significant amount of weight and he also needs to follow diet  and exercise.  He does not smoke.  He also is a prime candidate for  sleep apnea and apparently snores quite a bit.  I will refer him to one  of our pulmonologists for probable sleep study.  I will see him back in  3 months.     Madolyn Frieze Jens Som, MD, [facility]  Electronically Signed    BSC/MedQ  DD: 05/23/2008  DT: 05/23/2008  Job #: [id number]   cc:   Samuel Jester

## 2010-12-11 NOTE — Assessment & Plan Note (Signed)
[facility]                            CARDIOLOGY OFFICE NOTE   NAME:Lawrence Delgado, Lawrence Delgado                      MRN:          [id number]  DATE:07/16/2007                            DOB:          July 02, 1956    [name] is a very pleasant gentleman who has a history of prior  interior infarct with PCI of his LAD as well as his RCA in 2005.  Since  I last saw him he has not had any chest pain.  There is no dyspnea on  exertion, orthopnea, or pedal edema.  He is not smoking.  However, he is  not exercising or following a diet.   MEDICATIONS:  1. Crestor 20 mg p.o. daily.  2. Aspirin 325 mg p.o. daily.  3. Toprol 25 mg p.o. daily.  4. Tricor 145 mg p.o. daily.  5. Avapro 150 mg p.o. daily.  6. Glimepiride 4 mg p.o. daily.   PHYSICAL EXAMINATION:  Shows a blood pressure of 115/80 and his pulse is  66, he weighs 291 pounds.  HEENT:  Normal.  NECK:  Supple.  CHEST:  Clear.  CARDIOVASCULAR:  Reveals a regular rate and rhythm.  ABDOMINAL:  Shows no tenderness.  EXTREMITIES:  Show no edema.   An electrocardiogram shows a sinus rhythm at a rate of 70.  There are no  ST changes.   DIAGNOSES:  1. Coronary artery disease status post percutaneous coronary      intervention of his left anterior descending and right coronary      artery -- He is doing well from a symptomatic standpoint.  His      Myoview on March 16, 2007 showed an ejection fraction of 42%.      There is a prior anterior infarct with a very mild peri-infarct      ischemia towards the apex.  I think we can continue with medical      therapy.  We will continue with his aspirin, Toprol, Crestor and      Tricor.  He is also on an angiotensin reception blocker.  2. Hyperlipidemia -- His LDL was 72 on his recent laboratories and we      will continue with his present dose of Crestor.  3. Hypertension -- His blood pressure is adequately controlled on his      present medications.  4. Diabetes  mellitus -- Per his primary care physician.  5. HISTORY OF ALLERGY TO PLAVIX.  6. Gastroesophageal reflux disease.   We will see him back in 9 months.     Madolyn Frieze Jens Som, MD, [facility]  Electronically Signed    BSC/MedQ  DD: 07/16/2007  DT: 07/17/2007  Job #: [id number]   cc:   Samuel Jester

## 2010-12-11 NOTE — Assessment & Plan Note (Signed)
[facility]                            CARDIOLOGY OFFICE NOTE   NAME:Lawrence Delgado, Lawrence Delgado                      MRN:          [id number]  DATE:02/16/2007                            DOB:          1956/05/12    Lawrence Delgado is a pleasant 55 year old gentleman who has had a prior  anterior infarct with PCI of his LAD and his right coronary artery in  2005.  Since I last saw him he has not had exertional chest pain  although he states he feels soreness at times when he moves in certain  ways.  There is no dyspnea on exertion or orthopnea.  He occasionally  has mild pedal edema, right greater than left.  Note he states this has  been present since a previous fall on the right side.  He is not  exercising routinely or following a diet.  He has discontinued his  tobacco use now for 3 years.   His medications include:  1. Aspirin 325 mg p.o. daily.  2. Toprol-XL 25 mg p.o. daily.  3. TriCor 145 mg p.o. daily.  4. Avapro 150 mg p.o. daily.  5. Crestor 10 mg p.o. daily.  6. He also takes Lasix 40 mg p.o. b.i.d.  7. He takes glimepiride 4 mg p.o. daily.   PHYSICAL EXAMINATION TODAY:  Shows a blood pressure of 141/89, his pulse  is 66.  He weighs 288 pounds.  NECK:  Supple.  CHEST:  Clear.  CARDIOVASCULAR:  Reveals a regular rate and rhythm.  ABDOMEN:  Benign.  EXTREMITIES:  Show trace edema.  HEENT:  Normal.   His electrocardiogram shows a sinus rhythm at a rate of 66.  A prior  septal infarct cannot be excluded.   DIAGNOSES:  1. Coronary artery disease - we will continue with his aspirin, Toprol-      XL, Crestor and TriCor.  We will schedule him to have a Myoview for      risk stratification.  If it shows no ischemia then I think he needs      to increase his exercise.  I have also discussed the importance of      diet.  Note he does not smoke.  2. Hyperlipidemia - we will check lipids and liver today and adjust      for a goal LDL of less than 70.  3.  Diabetes mellitus - per his primary care physician.  4. History of allergy to PLAVIX.  5. Gastroesophageal reflux disease.  6. Hypertension - the patient has had increased potassium recently.      We will repeat that today.  If it is normal then we will increase      his Avapro to 300 mg p.o. daily for optimal blood pressure control.   I will see him back in 12 months.     Madolyn Frieze Jens Som, MD, [facility]  Electronically Signed    BSC/MedQ  DD: 02/16/2007  DT: 02/16/2007  Job #: [id number]   cc:   Samuel Jester

## 2010-12-11 NOTE — Assessment & Plan Note (Signed)
[facility]                            CARDIOLOGY OFFICE NOTE   NAME:Lawrence Delgado, Lawrence Delgado                      MRN:          [id number]  DATE:10/04/2008                            DOB:          [birth year]    Lawrence Delgado is a pleasant gentleman who has a history of prior anterior  infarct with PCI of his LAD as well as his right coronary artery.  I  last saw him on May 23, 2008.  At that time, we scheduled him to  have an echocardiogram to reassess his LV function and also a Pulmonary  consult to reevaluate for sleep apnea.  He did not show either of those  appointments.  He also was to be on Lasix for volume excess.  However,  he is not taking that.  Since I last saw him, he does have dyspnea on  exertion, but there is no orthopnea or PND.  He does have pedal edema.  His edema and shortness of breath are unchanged compared to May 23, 2008.  Note, his dyspnea resolved promptly with rest.  There is no  associated fevers or chills.  He also feels an unusual feeling in his  chest, but denies any some chest pain.  The pain is not pleuritic or  positional.  There is no radiation.   MEDICATIONS:  1. Aspirin 325 mg p.o. daily.  2. Toprol 25 mg p.o. daily.  3. TriCor 145 mg p.o. daily.  4. Avapro 150 mg p.o. daily.  5. Glimepiride.  6. Crestor 20 p.o. mg daily.  7. Vitamin D.   PHYSICAL EXAMINATION:  VITAL SIGNS:  Today shows a blood pressure of  132/90 and his pulse is 71.  He weighs 323 pounds.  GENERAL:  He is morbidly obese.  HEENT:  Normal.  NECK:  Supple.  CHEST:  Clear.  CARDIOVASCULAR:  Regular rate and rhythm.  ABDOMEN:  No tenderness.  EXTREMITIES:  1+ edema.   His electrocardiogram shows a sinus rhythm at a rate of 71.  The axis is  normal.  There are no ST changes noted.   DIAGNOSES:  1. Coronary artery disease - the patient will continue on his aspirin,      beta-blocker, and statin as well as his angiotensin receptor  blocker.  We will plan to repeat his Myoview for risk      stratification.  2. Dyspnea/edema - the patient is volume overloaded.  I have stressed      the importance of compliance with his Lasix.  We will resume at 40      mg daily as well as potassium elixir 20 mcg p.o. daily.  I will      check a BMET in one week to follow his potassium and renal      function.  We will plan to repeat his echocardiogram to reassess      his left ventricular function.  3. Hypertension - his blood pressure is elevated, but we are adding      Lasix.  We will add additional medications or increase his Toprol  or Avapro as needed.  4. Hyperlipidemia - we will continue with his statin and TriCor.  We      will check lipids and liver, adjust as indicated.  5. Diabetes mellitus.  6. Morbid obesity - the patient has been advised about diet and      exercise.  7. History of allergy to PLAVIX.  8. Gastroesophageal reflux disease.  9. Probable sleep apnea - Again I asked the patient he would like to      be seen by a pulmonologist for sleep study.  He has declined.  I      will see him back in 3 months.     Madolyn Frieze Jens Som, MD, [facility]  Electronically Signed    BSC/MedQ  DD: 10/04/2008  DT: 10/04/2008  Job #: [id number]   cc:   Samuel Jester

## 2010-12-14 NOTE — Consult Note (Signed)
NAME:  Lawrence Delgado, Lawrence Delgado                         ACCOUNT NO.:  [id number]   MEDICAL RECORD NO.:  [id number]                   PATIENT TYPE:  INP   LOCATION:  NA                                   FACILITY:  [facility]   PHYSICIAN:  Zetta Bills, MD                       DATE OF BIRTH:  [birth year]   DATE OF CONSULTATION:  12/26/2003  DATE OF DISCHARGE:                                   CONSULTATION   PRESENTING COMPLAINT:  Substernal chest pain that had been present for  around three hours on and off.   HISTORY OF PRESENT ILLNESS:  This is a 55 year old Caucasian male with  hypertension who has been having substernal chest pain on and off for about  one week or so.  He notes that his pain is unprovoked by activity and  resolves spontaneously. On the day of presentation he had severe substernal  chest pain at around 16:00 p.m. and this was associated with diaphoresis and  lightheadedness. He also had a feeling if impending doom and this prompted  him to call EMS. On initial evaluation by EMS he declined transport to the  emergency room, but then he called back around 19:00 p.m. and was  transported to the [facility] emergency room. In the emergency room initial  evaluation showed signs of an acute myocardial infarction and so she was  referred to [facility]. The patient on further questioning and  history notes a progressive shortness of breath that has been present for  the past one week or so, and is present on moderate exertion. He notes that  his shortness of breath is progressively increasing in quantity.   PAST MEDICAL HISTORY:  1. Hypertension.  2. Anxiety.  3. Gastroesophageal reflux disease.   MEDICATIONS:  Lotrel 5/20.   DRUG ALLERGIES:  CODEINE for which he gets nauseated.   SOCIAL HISTORY:  He lives in [place] with his wife and is on disability  secondary to hypertension and anxiety. She is a current smoker, smokes two  packs a day of cigarettes for the past 30 years or so.  He is on a regular  diet and has no exercise.   FAMILY HISTORY:  Significant for myocardial infarction in his mother and  father in the 51s and 76s respectively.   REVIEW OF SYSTEMS:  Significant for chills, headache, chest pain, shortness  of breath, dyspnea on exertion, presyncope, anxiety, nausea.   PHYSICAL EXAMINATION:  VITAL SIGNS: Pulse 94, respirations 28 per minute,  blood pressure 139/97.  GENERAL ASSESSMENT: He is in fair general condition, obese, looks older than  stated age, and is in an anxious state.  HEENT: He has poor oral hygiene and is edentulous.  NECK: Supple without adenopathy.  ABDOMEN: Soft, flat, nontender, nondistended, and normal bowel sounds.  CARDIOVASCULAR: Regular rate and rhythm. Heart sounds S1 and S2 are normal.  No  murmurs, rubs, or gallops are heard.  EXTREMITIES: No clubbing, cyanosis, or edema.  RESPIRATORY SYSTEM:  Both lung fields are clear to auscultation without  wheezes, rales, or rhonchi.  SKIN: No rash visible.  NEUROLOGIC: He is alert and oriented to time, person, and place. Cranial  nerves II-XII grossly intact.   On examination of his admission EKG it is noted that it has a rate of 110,  sinus rhythm, normal axis, normal intervals, and acute ST elevation in leads  V1 and V2 with reciprocal depression in II, III, and aVF.   Admission labs as follows: White cell count of 16.1, hemoglobin 19.6,  platelet count 264,000, MCV 91.9, MCHC 34.7.  Sodium of 136, potassium 4.1,  chloride 102, bicarbonate 26, BUN 6, creatinine 0.9, glucose 268.  CK level  is 1478, CK-MB 120.6, troponin-I 2.71.  PTT 22.9 seconds, PT 13.1 seconds,  and INR of 1.1. Calcium level is normal at 9.3.   ASSESSMENT/PLAN:  1. 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     The obvious risk factors that are notable from this patient's history are     his hypertension, sedentary lifestyle, type A personality, tobacco abuse,     and strong  family history of coronary artery disease that is present in     both parents. Coupled together with this history of his past, it is also     noted that he has EKG changes and cardiac enzymes that rule him in for ST-     elevation myocardial infarction. At this time he is brought directly to     the cardiac catheterization lab and will undergo percutaneous     intervention of the culprit lesion most likely to be proximal left     anterior descending coronary artery. Supportive management will also be     given with IV heparin/LMWH, Plavix, aspirin, beta blocker, ACE inhibitor,     and Integrilin.  2. Hypertension. We will initially start the patient on the above-stated     medications which have blood pressure lowering potential and will see how     he responds to these. If his blood pressure should be controlled on beta     blocker and ACE inhibitor, we will leave him on these medications.  3. Hyperglycemia. This is present upon admission and may be merely acute     adrenergic response to a stressful state. We will watch his capillary     blood glucose and probably obtain a hemoglobin A1C to further stratify     him and see whether he has glucose intolerance and probably diabetes.  4. Elevated hemoglobin levels. 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                                               Zetta Bills, MD    JP/MEDQ  D:  12/26/2003  T:  12/26/2003  Job:  [id number]

## 2010-12-14 NOTE — Discharge Summary (Signed)
NAME:  Lawrence Delgado, Lawrence Delgado                         ACCOUNT NO.:  [id number]   MEDICAL RECORD NO.:  [id number]                   PATIENT TYPE:  INP   LOCATION:  4727                                 FACILITY:  [facility]   PHYSICIAN:  Maple Mirza, P.A.              DATE OF BIRTH:  1956/07/18   DATE OF ADMISSION:  12/26/2003  DATE OF DISCHARGE:  01/01/2004                                 DISCHARGE SUMMARY   DISCHARGE DIAGNOSES:  1. Admitted with chest pain, finding of anterolateral myocardial infarction.  2. Urgent catheterization May 30 - Ejection fraction 46% and myocardial     infarction, severe two-vessel coronary artery disease as described below.  3. Percutaneous transluminal coronary angioplasty/stenting times two in the     proximal to mid left anterior descending artery June 3, reducing a 95%     stenosis at the proximal to 0 and a 90% mid point stenosis to 0.  4. Enrolled in the APEX study.  5. Staged percutaneous coronary intervention.  6. Stent times two the right coronary artery.  Overlapping TAXUS stents were     placed.  7. New diagnosis of diabetes mellitus type 2.  8. Adverse lipid profile.  9. Strong family history of premature coronary artery disease.  10.      Ongoing tobacco habituation.   SECONDARY DIAGNOSES:  1. Hypertension.  2. Anxiety.  3. Gastroesophageal reflux disease.   PROCEDURES:  1. Urgent left heart catheterization May 30.  Studies show that the left     main was free of disease, the left anterior descending had a 95% proximal     stenosis with thrombus, but with TIMI-3 flow, the left circumflex had a     50% mid point stenosis and both obtuse marginal 1 and obtuse marginal 2     had 60% ostial stenosis.  The right coronary artery was dominant, had a     90% proximal stenosis.  2. At catheterization, stents times two were placed in the proximal and mid     LAD, reducing both lesions to 0%.  3. Staged PCI of the right coronary artery December 30, 2003.   Overlapping TAXUS     stents were placed reducing an 80% stenosis to 0%.  4. Echocardiogram December 29, 2003 - Ejection fraction 35 to 45%, trivial mitral     regurgitation.   DISCHARGE DISPOSITION:  The patient was ready for discharge on post  procedure day number two.  He had been kept an extra 24 hours after  undergoing a staged PCI to the right coronary artery, secondary to possible  volume overload in relation to recent cardiac procedures and in the setting  of reduced ejection fraction.  The patient was given some IV Lasix and  responded well, was ready for discharge on June 5.  He has been afebrile  this hospitalization.  His blood pressure has been mildly decreased, so that  his  discharge medications are set at levels that he can tolerate.  He is  ambulatory and has no further chest pain.  He is alert and oriented times  three.   He goes home with the following medications:  1. Enteric coated aspirin 325 mg daily.  2. Plavix 75 mg daily for one year.  3. Lipitor 80 mg daily at bedtime.  4. Toprol XL 25 mg daily.  5. Altace 2.5 mg daily.  6. Glucotrol 5 mg daily.  7. Nitroglycerin 0.4 mg one tablet under the tongue every five minutes times     three doses as needed for chest pain.  8. Tylenol 325 mg one to two tablets every four to six hours as needed for     pain.   He is asked to avoid heavy lifting or straining for the next two weeks.  He  may drive beginning Monday, June 6.   DISCHARGE DIET:  Low-sodium, low-cholesterol, diabetic diet and he will  probably have a more concentrated follow-up at his primary care giver for  diabetes.   The patient may shower.  He is to call [phone] if he experiences pain or  swelling at the catheterization site.  He has a __________ visit with Dr.  Olga Millers at [facility] [address] in two weeks.  The office will call with that appointment and he is asked to call Dr.  Silvana Newness office to arrange an appointment to  follow-up with diabetes  management.   BRIEF HISTORY:  [name] is a 55 year old male with hypertension.  He has been having substernal chest pain on and off for about one week or  so.  He notes that the pain is unprovoked by activity and resolves  spontaneously.  On the day of presentation, May 30, he had severe substernal  chest pain around 4 o'clock in the afternoon and this was associated with  diaphoresis and lightheadedness.  He also had a feeling of impending doom  and this prompted him to call emergency medical services.  Initial  evaluation by EMS, the patient declined transport to the emergency room, but  then called back about 7 o'clock in the evening and was taken to [facility].  In the emergency room, initial evaluation showed signs of acute  anterolateral myocardial infarction.  The patient was transferred to [facility].  On further questioning, the patient notes progressive  shortness of breath that has been present for the past week or so.  It is  present with moderate exertion and has been increasing.  The patient has  obvious risk factors including family history, which is strong for premature  coronary artery disease, hypertension, sedentary lifestyle, type A  personality, tobacco habituation.  He now has elevated cardiac enzymes and  an electrocardiogram that shows ST elevations consistent with anterior wall  MI.  The patient will be referred for urgent left heart catheterization.   HOSPITAL COURSE:  After admission to [facility] from [facility] through the emergency room with finding of electrocardiographic ST  elevations consistent with anterolateral myocardial infarction, the patient  was transferred for urgent left heart catheterization.  The study is as  dictated above.  He has severe two-vessel disease, both in the LAD and the right coronary artery with involvement of obtuse marginals 1 and 2 with  ostial 60% stenoses.  The  patient received immediate stenting of the  proximal and mid LAD with relief of symptoms and was scheduled for  staged  PCI to the right coronary artery at a later date.  The patient has undergone  a smoking cessation consult and the patient and his wife have both agreed to  quit cold [place].  The patient had mild temperature elevations on post  procedure day number two.  These have since resolved and were of unclear  etiology.  The patient's blood pressure has also been very well controlled  with systolic blood pressure never increasing beyond 110 during this  hospitalization.  A 2D echocardiogram was obtained June 2.  During this  hospitalization he was found to have a hemoglobin A1c of 8 in addition to  findings of elevated fasting serum blood glucose.  He has been started on  Glucotrol 5 mg daily and counseled to follow with primary care as an  outpatient.  The patient underwent staged PCI of the right coronary artery  four days after his initial left heart catheterization.  Two stents  overlapping in the right coronary were placed proximally without  complication.  The patient has been kept at [facility] for 48 hours  after this procedure and goes home with medications and follow-up as  dictated.  In addition to the medications indicated above, because his lipid  profile was markedly dyslipidemic, the patient was also placed on Tricor 145  mg daily as well as Lipitor 80 mg daily as combination therapy.   LABORATORY STUDIES AT THE TIME OF DISCHARGE:  June 5 serum electrolytes:  Potassium 3.4, sodium 136, chloride 101, carbonate 27, BUN 14, creatinine 1,  glucose 92.  His potassium was replenished with 40 mEq prior to discharge.  His BNP at discharge was 411.6.  Hemoglobin A1c this admission was 8.0.  Lipid profile:  Total cholesterol 234, triglycerides 292, HDL cholesterol  26, LDL cholesterol 150.  It is felt that Tricor we are adding with decrease  triglycerides  significantly, will help to elevate the HDL and also will help  to remodel small particle LDL to a larger particle LDL as shown in the  [place] study.                                                Maple Mirza, P.A.    GM/MEDQ  D:  01/01/2004  T:  01/02/2004  Job:  [id number]   cc:   Olga Millers, M.D. [facility], Dr.  Jonita Albee, [place]

## 2010-12-14 NOTE — Assessment & Plan Note (Signed)
[facility]                              CARDIOLOGY OFFICE NOTE   NAME:Gurski, RADIN RAPTIS                      MRN:          [id number]  DATE:02/11/2006                            DOB:          [birth year]    Lawrence Delgado is a 55 year old gentleman who has had a prior anterior  infarction with PCI of his LAD, as well as his right coronary artery.  Since  I last saw him, there is no dyspnea, chest pain, palpitations or syncope.  He did cancel his previous nuclear study.   MEDICATIONS:  1.  Aspirin 325 mg p.o. daily.  2.  Toprol 25 mg p.o. daily.  3.  Tricor 145 mg p.o. daily.  4.  Crestor 10 mg p.o. daily.  5.  Avapro 150 mg p.o. daily.  6.  Glimepiride.   PHYSICAL EXAMINATION:  VITAL SIGNS:  Blood pressure of 118/82, pulse 69.  He  weighs 264 pounds.  NECK:  Supple with no bruits.  CHEST:  Clear.  CARDIOVASCULAR:  Regular rate and rhythm.  ABDOMEN:  No pulsatile masses and no bruits.  EXTREMITIES:  No edema.   ELECTROCARDIOGRAM:  Normal sinus rhythm at a rate of 69.  The axis is  normal.  There were no ST changes noted.   DIAGNOSES:  1.  Coronary artery disease.  2.  Hyperlipidemia.  3.  Diabetes mellitus.  4.  History of allergy to PLAVIX.  5.  Gastroesophageal reflux disease.  6.  Hypertension.   PLAN:  Lawrence Delgado is doing well from a symptomatic standpoint.  We will  continue with his present medications.  Dr. Charm Barges is following his lipids  and liver, and our goal LDL should be less than 70, given his history of  coronary artery disease and diabetes mellitus.  He has discontinued  his tobacco use.  Note, we have again discussed the importance of exercise  and diet.  He will see me back in 12 months.                              Madolyn Frieze Jens Som, MD, [facility]    BSC/MedQ  DD:  02/11/2006  DT:  02/11/2006  Job #:  [id number]   cc:   Samuel Jester

## 2010-12-14 NOTE — Cardiovascular Report (Signed)
NAME:  Lawrence Delgado, Lawrence Delgado                         ACCOUNT NO.:  [id number]   MEDICAL RECORD NO.:  [id number]                   PATIENT TYPE:  INP   LOCATION:  NA                                   FACILITY:  [facility]   PHYSICIAN:  Carole Binning, M.D. [facility]         DATE OF BIRTH:  Aug 26, 1955   DATE OF PROCEDURE:  12/26/2003  DATE OF DISCHARGE:                              CARDIAC CATHETERIZATION   PROCEDURES:  1. Left heart catheterization with coronary angiography, left     ventriculography, and abdominal aortography.  2. Percutaneous transluminal coronary angioplasty with stent placement x2 in     the proximal and mid-left anterior descending artery.   INDICATIONS:  Mr. Darley is a 55 year old male who presented to [facility] with chest pain and anterior ST segment elevation.  He was treated  at [facility] with subcutaneous Lovenox, intravenous Integrilin, and oral  Plavix.  He was then transferred emergently to [facility] for cardiac  catheterization.  On arrival to [facility] the patient was pain-  free.   CATHETERIZATION PROCEDURAL NOTE:  A 6 French sheath was placed in the right  femoral artery.  Coronary angiography was performed with standard Judkins 6  French catheter.  Left ventriculography and abdominal aortography were  performed with an angled pigtail catheter.  Contrast was Omnipaque.  There  were no complications.   RESULTS:   HEMODYNAMICS:  1. Left ventricular pressure:  120/36.  2. Aortic pressure:  122/90.  3. There was no aortic valve gradient.   LEFT VENTRICULOGRAM:  There is akinesis of the anterior apical wall.  The  inferior wall is hyperdynamic.  Ejection fraction is calculated at 46%.  There is no mitral regurgitation.   ABDOMINAL AORTOGRAM:  Normal abdominal aorta, renal arteries, and iliac  arteries.   CORONARY ARTERIOGRAPHY (RIGHT-DOMINANT):  1. Left main is normal.  2. Left anterior descending artery has a 95% stenosis in the  proximal vessel     with probable thrombus but TIMI-3 flow.  In the midvessel there is a 90%     stenosis with a mild amount of calcification.  The distal vessel has a     diffuse 20% stenosis.  The LAD gives rise to a small first diagonal     branch, a normal-sized second diagonal branch, and a small third diagonal     branch.  3. Left circumflex gives rise to two normal obtuse marginal branches.  There     is a 50% stenosis in the mid-circumflex at the bifurcation of the first     and second obtuse marginal branch with a 60% stenosis in the ostium of     both the first and second obtuse marginal branches.  4. Right coronary artery is a dominant vessel.  There is a long 90% stenosis     in the proximal vessel.  The distal right coronary gives rise to a normal-  sized posterior descending artery, a small first posterolateral branch, a     large second posterolateral branch, and a third posterolateral branch.   IMPRESSION:  1. Mild decreased in left ventricular systolic function secondary to an     acute anterior wall myocardial infarction.  2. Three-vessel coronary artery disease.  The culprit is the 95% stenosis in     the proximal left anterior descending artery.  There is moderate disease     that does not appear to be hemodynamically significant in the left     circumflex.  There is significant disease in the proximal right coronary     artery.   PLAN:  Percutaneous intervention to the LAD.   PERCUTANEOUS TRANSLUMINAL CORONARY ANGIOPLASTY PROCEDURAL NOTE:  Following  completion of the diagnostic catheterization, we proceeded with percutaneous  coronary intervention.  We utilized the pre-existing 6 French sheath in the  right femoral artery.  The patient had been given full-dose subcutaneous  Lovenox prior to transfer and no additional heparin or Lovenox was utilized.  We did give an additional bolus of intravenous Integrilin and continued the  Integrilin infusion.  We used a 6  [place] guiding catheter.  An Asahi  soft wire was advanced under fluoroscopic guidance into the distal LAD.  We  then advanced a 2.7 x 5 x 20 mm Quantum balloon and positioned this across  the lesion in the proximal vessel.  The balloon was inflated to 10  atmospheres.  We attempted to cross the mid-lesion with this balloon;  however, it would not cross.  We then advanced a 2.5 x 15 mm Maverick  balloon and positioned it across the lesion in the mid-LAD and inflated this  balloon to 14 atmospheres.  We then went back with our 2.75 x 20 mm Quantum  balloon and positioned it across the disease in the midvessel and inflated  this to 20 atmospheres.  We then advanced a 2.75 x 32 mm Taxus drug-eluting  stent.  With some difficulty we were able to advance the distal end of the  stent beyond the mid-lesion.  This stent did cover both lesions; however,  there was incomplete coverage of the lesion in the proximal LAD.  We  deployed this stent at 14 atmospheres.  We then went back in with a 2.75 x 8  mm Taxus drug-eluting stent and positioned this in the proximal vessel with  slight overlap of the previously-placed stent.  This stent was deployed at  14 atmospheres.  We then went back in with a 3.0 x 18 mm PowerSail balloon  and inflated this to 16 atmospheres in the distal aspect of the stent, 18  atmospheres x2 in the midportion of the stents, and 17 atmospheres in the  proximal portion of the stents.  Intermittent doses of intracoronary  nitroglycerin and verapamil were administered.  Final angiographic images  were obtained revealing patency of the LAD with 0% residual stenosis at the  stent site and TIMI-3 flow.   COMPLICATIONS:  None.   RESULTS:  Successful percutaneous transluminal coronary angioplasty with  stent placement x2 in the proximal and mid-left anterior descending artery. A 95% stenosis in the proximal vessel with probable thrombus and a 90%  stenosis in the midvessel were  both reduced to 0% residual with TIMI-3 flow.   PLAN:  Integrilin will be continued with 18 hours.  The patient will be  treated with Plavix for 12 months.  It is anticipated we will proceed with  staged percutaneous coronary intervention of the right coronary artery in  three to four days.                                               Carole Binning, M.D. [facility]    MWP/MEDQ  D:  12/26/2003  T:  12/27/2003  Job:  [id number]   cc:   Olga Millers, M.D. [facility]   [address]  [place]  [place] [address]  Fax: [phone]   Cardiac Catheterization Lab

## 2010-12-14 NOTE — Cardiovascular Report (Signed)
NAME:  Lawrence Delgado, Lawrence Delgado                         ACCOUNT NO.:  [id number]   MEDICAL RECORD NO.:  [id number]                   PATIENT TYPE:  INP   LOCATION:  4727                                 FACILITY:  [facility]   PHYSICIAN:  Salvadore Farber, M.D. [facility]         DATE OF BIRTH:  1955/11/07   DATE OF PROCEDURE:  12/30/2003  DATE OF DISCHARGE:                              CARDIAC CATHETERIZATION   PROCEDURE:  Drug-eluting stent placement times two to the right coronary  artery.   INDICATION:  Mr. Maynes is a 55 year old gentleman who presented on Dec 26, 2003, with anterior myocardial infarction.  Dr. Gerri Spore performed drug-  eluting stent placement in the culprit lesion of the LAD.  He also had a  severe stenosis of the proximal RCA.  Patient now returns for planned staged  intervention.   PROCEDURAL TECHNIQUE:  Informed consent was obtained.  Under 1% lidocaine  local anesthesia, a 6 French sheath was placed in the right common femoral  artery using the modified Seldinger technique.  Anticoagulation was  initiated with Bivalirudin.  ACT was confirmed to be greater than 225  seconds.  The patient had been maintained on Plavix since the time of his  initial presentation.   A 6 [place] guide was advanced over a wire and engaged in the ostium of  the RCA.  A Luge wire was advanced to the distal vessel without difficulty.  The distal portion of the lesion was directly stented using a 3.5 x 24 mm  TAXUS at 18 atmospheres.  The proximal portion of the lesion was then  stented using an overlapping 3.5 x 20 mm TAXUS also deployed at 18  atmospheres.  The entirety of both stents was then post dilated using a 3.75  mm power sail balloon at 18 atmospheres.  Careful attention was paid to the  region of overlap.  The patient tolerated the procedure well and was  transferred to the holding room in stable condition.  Bivalirudin infusion  was discontinued at the end of the case.   COMPLICATIONS:  None.   IMPRESSION/PLAN:  Successful drug-eluting stent placement reducing stenosis  from 80% to 0% in the mid RCA.  Patient will be maintained on Plavix for at  least a year.  The sheath will be removed two hours after completion of the  procedure.                                               Salvadore Farber, M.D. [facility]    WED/MEDQ  D:  12/30/2003  T:  12/31/2003  Job:  [id number]   cc:   Olga Millers, M.D. [facility] [address]  [place]  [place] [address]  Fax: [phone]

## 2010-12-25 ENCOUNTER — Telehealth: Payer: Self-pay | Admitting: Cardiology

## 2010-12-25 NOTE — Telephone Encounter (Signed)
Called patient and he advised me that he has been having hay fever and wanted some advise on what he could take. I advised him that he could take plain Claritin in the daytime and Benadryl at night.

## 2010-12-25 NOTE — Telephone Encounter (Signed)
Pt having allergy pt wants to know what med can he take over the counter.

## 2011-01-31 ENCOUNTER — Ambulatory Visit: Payer: Self-pay | Admitting: Cardiology

## 2011-02-21 ENCOUNTER — Encounter: Payer: Self-pay | Admitting: Cardiology

## 2011-02-26 ENCOUNTER — Telehealth: Payer: Self-pay | Admitting: Cardiology

## 2011-02-26 ENCOUNTER — Ambulatory Visit: Payer: Self-pay | Admitting: Cardiology

## 2011-02-26 NOTE — Telephone Encounter (Signed)
Per pt wife called said it's an emergency that she speak with you regarding her hubsand.  No more information was given.

## 2011-02-26 NOTE — Telephone Encounter (Signed)
Spoke with pt, he is unable to keep his appt today due to problems at home. appt rescheduled Lawrence Delgado

## 2011-03-27 ENCOUNTER — Other Ambulatory Visit: Payer: Self-pay | Admitting: Cardiology

## 2011-03-27 MED ORDER — IRBESARTAN 150 MG PO TABS: 150.0000 mg | ORAL_TABLET | Freq: Every day | ORAL | Status: DC

## 2011-03-29 ENCOUNTER — Encounter: Payer: Self-pay | Admitting: Cardiology

## 2011-03-29 ENCOUNTER — Ambulatory Visit (INDEPENDENT_AMBULATORY_CARE_PROVIDER_SITE_OTHER): Payer: Medicare Other | Admitting: Cardiology

## 2011-03-29 DIAGNOSIS — E785 Hyperlipidemia, unspecified: Secondary | ICD-10-CM

## 2011-03-29 DIAGNOSIS — R079 Chest pain, unspecified: Secondary | ICD-10-CM

## 2011-03-29 DIAGNOSIS — I10 Essential (primary) hypertension: Secondary | ICD-10-CM

## 2011-03-29 DIAGNOSIS — I2510 Atherosclerotic heart disease of native coronary artery without angina pectoris: Secondary | ICD-10-CM

## 2011-03-29 LAB — HEPATIC FUNCTION PANEL
ALT: 43 U/L (ref 0–53)
AST: 44 U/L — ABNORMAL HIGH (ref 0–37)
Albumin: 4.1 g/dL (ref 3.5–5.2)
Total Bilirubin: 0.8 mg/dL (ref 0.3–1.2)
Total Protein: 7.6 g/dL (ref 6.0–8.3)

## 2011-03-29 LAB — LIPID PANEL
HDL: 41.3 mg/dL (ref >39.00)
Total CHOL/HDL Ratio: 3

## 2011-03-29 LAB — BASIC METABOLIC PANEL
CO2: 25 mEq/L (ref 19–32)
Potassium: 4.3 mEq/L (ref 3.5–5.1)

## 2011-03-29 NOTE — Patient Instructions (Signed)
Your physician recommends that you have lab work today: liver/lipid/bmp [phone])  Your physician has requested that you have a lexiscan myoview. For further information please visit [email or web address] Please follow instruction sheet, as given.  Your physician wants you to follow-up in: 1 year. You will receive a reminder letter in the mail two months in advance. If you don't receive a letter, please call our office to schedule the follow-up appointment.

## 2011-03-29 NOTE — Assessment & Plan Note (Signed)
Blood pressure controlled. Continue present medications. Check potassium and renal function. 

## 2011-03-29 NOTE — Assessment & Plan Note (Signed)
Continue aspirin, beta blocker and statin. 

## 2011-03-29 NOTE — Progress Notes (Signed)
HPI: Mr. Helbing is a pleasant gentleman who has a history of prior anterior infarct with PCI of his LAD as well as his right coronary artery. Last Myoview in May of 2010 showed an ejection fraction of 44%. There was a prior anterior infarct with trivial peri-infarct ischemia. Echocardiogram May 2010 was technically difficult. The LV function was felt to be 65-70%. I last saw him in Oct of 2011. Since then, he describes occasional chest pain. It is in the left upper chest and described as an uncomfortable feeling. It does not radiate. There is no associated shortness of breath, nausea, vomiting, diaphoresis. It can last several hours and resolves spontaneously. It is unlike his infarct pain. He denies dyspnea on exertion, orthopnea, PND, pedal edema or exertional chest pain.  Current Outpatient Prescriptions  Medication Sig Dispense Refill  . aspirin 325 MG tablet Take 325 mg by mouth daily.        . fenofibrate (TRICOR) 145 MG tablet Take 145 mg by mouth daily.        . insulin aspart (NOVOLOG) 100 UNIT/ML injection Sliding scale       . insulin NPH-insulin regular (NOVOLIN 70/30) (70-30) 100 UNIT/ML injection as directed.        . irbesartan (AVAPRO) 150 MG tablet Take 1 tablet (150 mg total) by mouth at bedtime.  30 tablet  6  . LORazepam (ATIVAN) 1 MG tablet Take 1 mg by mouth every 8 (eight) hours as needed.        . metFORMIN (GLUCOPHAGE) 500 MG tablet Take 500 mg by mouth 2 (two) times daily with a meal.       . metoprolol succinate (TOPROL-XL) 25 MG 24 hr tablet Take 1 tablet (25 mg total) by mouth daily.  30 tablet  11  . nitroGLYCERIN (NITROSTAT) 0.4 MG SL tablet Place 0.4 mg under the tongue every 5 (five) minutes as needed.        . rosuvastatin (CRESTOR) 40 MG tablet Take 40 mg by mouth daily.        . Vitamin D, Ergocalciferol, (DRISDOL) 50000 UNITS CAPS Take 50,000 Units by mouth every 7 (seven) days.           Past Medical History  Diagnosis Date  . CAD (coronary artery disease)     . GERD (gastroesophageal reflux disease)   . DM (diabetes mellitus)   . HLD (hyperlipidemia)   . HTN (hypertension)   . Sleep apnea   . Obesity     Past Surgical History  Procedure Date  . Coronary stent placement     eluting stent placement in the culpric lesion of the LAD  . Cardiac catheterization     with coronary angiography     History   Social History  . Marital Status: Married    Spouse Name: N/A    Number of Children: N/A  . Years of Education: N/A   Occupational History  . Not on file.   Social History Main Topics  . Smoking status: Former Games developer  . Smokeless tobacco: Not on file  . Alcohol Use: Not on file  . Drug Use: Not on file  . Sexually Active: Not on file   Other Topics Concern  . Not on file   Social History Narrative  . No narrative on file    ROS: no fevers or chills, productive cough, hemoptysis, dysphasia, odynophagia, melena, hematochezia, dysuria, hematuria, rash, seizure activity, orthopnea, PND, pedal edema, claudication. Remaining systems are negative.  Physical Exam:  Well-developed obese in no acute distress.  Skin is warm and dry.  HEENT is normal.  Neck is supple. No thyromegaly.  Chest is clear to auscultation with normal expansion.  Cardiovascular exam is regular rate and rhythm.  Abdominal exam nontender or distended. No masses palpated. Extremities show no edema. neuro grossly intact  ECG NSR, CRO septal MI, no ST changes.

## 2011-03-29 NOTE — Assessment & Plan Note (Signed)
Symptoms atypical. Schedule Myoview. 

## 2011-03-29 NOTE — Assessment & Plan Note (Signed)
Continue statin. Check lipids and liver. 

## 2011-04-02 ENCOUNTER — Telehealth: Payer: Self-pay | Admitting: Cardiology

## 2011-04-02 NOTE — Telephone Encounter (Signed)
Pt ware of results and to repeat LFT's in 6 months

## 2011-04-02 NOTE — Telephone Encounter (Signed)
Pt would like lab results.  

## 2011-04-03 ENCOUNTER — Encounter: Payer: Self-pay | Admitting: *Deleted

## 2011-04-11 ENCOUNTER — Other Ambulatory Visit (HOSPITAL_COMMUNITY): Payer: Medicare Other | Admitting: Radiology

## 2011-05-06 ENCOUNTER — Other Ambulatory Visit (HOSPITAL_COMMUNITY): Payer: Medicare Other | Admitting: Radiology

## 2011-05-08 ENCOUNTER — Other Ambulatory Visit: Payer: Self-pay | Admitting: *Deleted

## 2011-05-08 MED ORDER — ROSUVASTATIN CALCIUM 40 MG PO TABS: 40.0000 mg | ORAL_TABLET | Freq: Every day | ORAL | Status: DC

## 2011-05-09 ENCOUNTER — Other Ambulatory Visit (HOSPITAL_COMMUNITY): Payer: Medicare Other | Admitting: Radiology

## 2011-05-14 ENCOUNTER — Other Ambulatory Visit (HOSPITAL_COMMUNITY): Payer: Medicare Other | Admitting: Radiology

## 2011-06-03 ENCOUNTER — Telehealth: Payer: Self-pay | Admitting: Cardiology

## 2011-06-03 NOTE — Telephone Encounter (Signed)
Spoke with pt, questions answered Lawrence Delgado  

## 2011-06-03 NOTE — Telephone Encounter (Signed)
Pt calling re stress test tomorrow, has questions

## 2011-06-04 ENCOUNTER — Other Ambulatory Visit (HOSPITAL_COMMUNITY): Payer: Medicare Other | Admitting: Radiology

## 2011-06-04 DIAGNOSIS — R0989 Other specified symptoms and signs involving the circulatory and respiratory systems: Secondary | ICD-10-CM

## 2011-06-19 ENCOUNTER — Other Ambulatory Visit (HOSPITAL_COMMUNITY): Payer: Medicare Other | Admitting: Radiology

## 2011-06-19 ENCOUNTER — Encounter (HOSPITAL_COMMUNITY): Payer: Self-pay | Admitting: Radiology

## 2011-06-19 NOTE — Progress Notes (Signed)
Mr. Hirota was scheduled for a Myoview study today and No Showed. This was the fifth time he has cancelled or No Showed for this study. We will not contact him to reschedule.  Leonia Corona, RT-N

## 2011-07-03 ENCOUNTER — Telehealth: Payer: Self-pay | Admitting: Cardiology

## 2011-07-03 NOTE — Telephone Encounter (Signed)
Spoke with pt, he states he canceled his stress test several times because he did not feel well. He is interested in rescheduling at this time. The pt has no showed or canceled 5 times. Will discuss with dr Jens Som Deliah Goody

## 2011-07-03 NOTE — Telephone Encounter (Signed)
New message: please call patient back he has some questions to ask.

## 2011-07-03 NOTE — Telephone Encounter (Signed)
Discussed with dr Jens Som, okay to reschedule. Pt made aware that if he misses the next appt for myoview he will be charged $100. Pt voiced understanding. Discussed with [facility], they will call and reschedule [name]

## 2011-08-26 ENCOUNTER — Telehealth: Payer: Self-pay | Admitting: Cardiology

## 2011-08-26 NOTE — Telephone Encounter (Signed)
Spoke with pt, he is having trouble sleeping.

## 2011-08-26 NOTE — Telephone Encounter (Signed)
Explained to pt he would need to get a prescription from his primary care doctor. He will try tylenol pm first. He will have repeat labs on Thursday with stress test.

## 2011-08-26 NOTE — Telephone Encounter (Signed)
New Msg: Pt calling wanting to speak with nurse. Please return pt call to discuss further.  

## 2011-08-29 ENCOUNTER — Other Ambulatory Visit: Payer: Medicare Other | Admitting: *Deleted

## 2011-08-29 ENCOUNTER — Other Ambulatory Visit (HOSPITAL_COMMUNITY): Payer: Medicare Other | Admitting: Radiology

## 2011-09-04 ENCOUNTER — Other Ambulatory Visit (INDEPENDENT_AMBULATORY_CARE_PROVIDER_SITE_OTHER): Payer: Medicare Other | Admitting: *Deleted

## 2011-09-04 ENCOUNTER — Ambulatory Visit (HOSPITAL_COMMUNITY): Payer: Medicare Other | Attending: Cardiovascular Disease | Admitting: Radiology

## 2011-09-04 DIAGNOSIS — I2510 Atherosclerotic heart disease of native coronary artery without angina pectoris: Secondary | ICD-10-CM | POA: Insufficient documentation

## 2011-09-04 DIAGNOSIS — R7989 Other specified abnormal findings of blood chemistry: Secondary | ICD-10-CM

## 2011-09-04 DIAGNOSIS — R002 Palpitations: Secondary | ICD-10-CM | POA: Insufficient documentation

## 2011-09-04 DIAGNOSIS — E119 Type 2 diabetes mellitus without complications: Secondary | ICD-10-CM | POA: Insufficient documentation

## 2011-09-04 DIAGNOSIS — E785 Hyperlipidemia, unspecified: Secondary | ICD-10-CM | POA: Insufficient documentation

## 2011-09-04 DIAGNOSIS — R079 Chest pain, unspecified: Secondary | ICD-10-CM

## 2011-09-04 DIAGNOSIS — Z794 Long term (current) use of insulin: Secondary | ICD-10-CM | POA: Insufficient documentation

## 2011-09-04 DIAGNOSIS — Z9861 Coronary angioplasty status: Secondary | ICD-10-CM | POA: Insufficient documentation

## 2011-09-04 DIAGNOSIS — I10 Essential (primary) hypertension: Secondary | ICD-10-CM

## 2011-09-04 DIAGNOSIS — R0602 Shortness of breath: Secondary | ICD-10-CM

## 2011-09-04 DIAGNOSIS — Z87891 Personal history of nicotine dependence: Secondary | ICD-10-CM | POA: Insufficient documentation

## 2011-09-04 DIAGNOSIS — Z8249 Family history of ischemic heart disease and other diseases of the circulatory system: Secondary | ICD-10-CM | POA: Insufficient documentation

## 2011-09-04 DIAGNOSIS — I252 Old myocardial infarction: Secondary | ICD-10-CM | POA: Insufficient documentation

## 2011-09-04 LAB — HEPATIC FUNCTION PANEL
AST: 26 U/L (ref 0–37)
Total Protein: 7 g/dL (ref 6.0–8.3)

## 2011-09-04 MED ORDER — TECHNETIUM TC 99M TETROFOSMIN IV KIT
11.0000 | PACK | Freq: Once | INTRAVENOUS | Status: AC | PRN
  Administered 2011-09-04: 11 via INTRAVENOUS

## 2011-09-04 MED ORDER — TECHNETIUM TC 99M TETROFOSMIN IV KIT
33.0000 | PACK | Freq: Once | INTRAVENOUS | Status: AC | PRN
  Administered 2011-09-04: 33 via INTRAVENOUS

## 2011-09-04 MED ORDER — REGADENOSON 0.4 MG/5ML IV SOLN
0.4000 mg | Freq: Once | INTRAVENOUS | Status: AC
  Administered 2011-09-04: 0.4 mg via INTRAVENOUS

## 2011-09-04 NOTE — Progress Notes (Signed)
[facility] SITE 3 NUCLEAR MED [address] [place] [address] [phone]  Cardiology Nuclear Med Study  Lawrence Delgado is a 56 y.o. 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 Infarct trivial ischemia peri-infarct, 05/10 ECHO: EF: 65-70% tech. Difficult study Cardiac Risk Factors: Family History - CAD, History of Smoking, Hypertension, IDDM Type 2 and Lipids  Symptoms:  Chest Pain, Palpitations and SOB   Nuclear Pre-Procedure Caffeine/Decaff Intake:  None NPO After: 11:00pm   Lungs:  clear IV 0.9% NS with Angio Cath:  20g  IV Site: R Antecubital  IV Started by:  Stanton Kidney, EMT-P  Chest Size (in):  54 Cup Size: n/a  Height: 6\' 1"  (1.854 m)  Weight:  290 lb (131.543 kg)  BMI:  Body mass index is 38.26 kg/(m^2). Tech Comments:  All Rx's held this a, per patient.  CBG= 254@ 7am, per patient.    Nuclear Med Study 1 or 2 day study: 1 day  Stress Test Type:  Eugenie Birks  Reading MD: Charlton Haws, MD  Order Authorizing Provider:  B.Crenshaw  Resting Radionuclide: Technetium 69m Tetrofosmin  Resting Radionuclide Dose: 11.0 mCi   Stress Radionuclide:  Technetium 75m Tetrofosmin  Stress Radionuclide Dose: 33.0 mCi           Stress Protocol Rest HR: 68 Stress HR: 90  Rest BP: 161/87 Stress BP: 159/76  Exercise Time (min): n/a METS: n/a   Predicted Max HR: 167 bpm % Max HR: 51.5 bpm Rate Pressure Product: 69629   Dose of Adenosine (mg):  n/a Dose of Lexiscan: 0.4 mg  Dose of Atropine (mg): n/a Dose of Dobutamine: n/a mcg/kg/min (at max HR)  Stress Test Technologist: Milana Na, EMT-P  Nuclear Technologist:  Domenic Polite, CNMT     Rest Procedure:  Myocardial perfusion imaging was performed at  rest 45 minutes following the intravenous administration of Technetium 32m Tetrofosmin. Rest ECG: NSR  Stress Procedure:  The patient received IV Lexiscan 0.4 mg over 15-seconds.  Technetium 12m Tetrofosmin injected at 30-seconds.  There were no significant changes with Lexiscan.  Quantitative spect images were obtained after a 45 minute delay. Stress ECG: No significant change from baseline ECG  QPS Raw Data Images:  Normal; no motion artifact; normal heart/lung ratio. Stress Images:  There is decreased uptake in the anterior wall. Rest Images:  There is decreased uptake in the anterior wall. Subtraction (SDS):  There is a fixed defect that is most consistent with a previous infarction. Transient Ischemic Dilatation (Normal <1.22):  1.02 Lung/Heart Ratio (Normal <0.45):  0.45  Quantitative Gated Spect Images QGS EDV:  155 ml QGS ESV:  92 ml QGS cine images:  Moderate decrease in function with anterior and apical hypokinesis QGS EF: 41%  Impression Exercise Capacity:  Lexiscan with no exercise. BP Response:  Normal blood pressure response. Clinical Symptoms:  No chest pain. ECG Impression:  No significant ST segment change suggestive of ischemia. Comparison with Prior Nuclear Study: No images to compare  Overall Impression:  Small area of infarct in the anterior wall from apex to base with no ischemia  EF 41% No ischemia   Charlton Haws

## 2011-09-05 ENCOUNTER — Telehealth: Payer: Self-pay | Admitting: Cardiology

## 2011-09-05 NOTE — Telephone Encounter (Signed)
New problem Pt's wife called Pt had a stress test yesterday. She said he had some chest pain last night and left arm pain. The pain comes and goes. Please call

## 2011-09-05 NOTE — Telephone Encounter (Signed)
Spoke with pt. He reports he had left arm pain and pain from chest into back last evening. He thinks it may be related to holding arms above head and lying on table for stress test yesterday.  Pain went away on own.  This is the only time he has had pain in last several months.  Stress test results reviewed with pt.  I instructed him to call us if pain were to happen again.

## 2011-10-10 ENCOUNTER — Telehealth: Payer: Self-pay | Admitting: Cardiology

## 2011-10-10 NOTE — Telephone Encounter (Signed)
Discussed wife's symptoms of her heart fluttering. She is not seen here. Advised them to consult her PCP and will advise if she needs a cardiologist, all agreed on plan.

## 2011-10-10 NOTE — Telephone Encounter (Signed)
Please return call to patient [phone]  Patient has medical questions, he can be reached at hm# [phone]

## 2011-10-22 ENCOUNTER — Other Ambulatory Visit: Payer: Self-pay | Admitting: Cardiology

## 2011-10-28 ENCOUNTER — Telehealth: Payer: Self-pay | Admitting: Cardiology

## 2011-10-28 NOTE — Telephone Encounter (Signed)
Pt need to have dental work done and he wants to know if he needs to have any medication changes or anything like that

## 2011-10-28 NOTE — Telephone Encounter (Signed)
Spoke with pt, he wanted to know of a dentist for having his teeth pulled. He is probably going to have to be admitted to the hosp due to  his insurance. He will call the main number to [facility] for the dental clinic.

## 2011-12-06 ENCOUNTER — Telehealth: Payer: Self-pay | Admitting: Cardiology

## 2011-12-06 ENCOUNTER — Other Ambulatory Visit: Payer: Self-pay | Admitting: Cardiology

## 2011-12-06 MED ORDER — ROSUVASTATIN CALCIUM 40 MG PO TABS: 40.0000 mg | ORAL_TABLET | Freq: Every day | ORAL | Status: DC

## 2011-12-06 NOTE — Telephone Encounter (Signed)
Pt needs refill on his crestor sent in to [facility]

## 2012-04-09 ENCOUNTER — Other Ambulatory Visit: Payer: Self-pay | Admitting: Cardiology

## 2012-04-09 DIAGNOSIS — I2510 Atherosclerotic heart disease of native coronary artery without angina pectoris: Secondary | ICD-10-CM

## 2012-04-09 MED ORDER — IRBESARTAN 150 MG PO TABS: 150.0000 mg | ORAL_TABLET | Freq: Every day | ORAL | Status: DC

## 2012-04-09 MED ORDER — ROSUVASTATIN CALCIUM 40 MG PO TABS: 40.0000 mg | ORAL_TABLET | Freq: Every day | ORAL | Status: DC

## 2012-04-09 MED ORDER — FENOFIBRATE 145 MG PO TABS: 145.0000 mg | ORAL_TABLET | Freq: Every day | ORAL | Status: DC

## 2012-04-09 MED ORDER — METOPROLOL SUCCINATE ER 25 MG PO TB24: 25.0000 mg | ORAL_TABLET | Freq: Every day | ORAL | Status: DC

## 2012-06-11 ENCOUNTER — Telehealth: Payer: Self-pay | Admitting: Cardiology

## 2012-06-11 MED ORDER — NITROGLYCERIN 0.4 MG SL SUBL: 0.4000 mg | SUBLINGUAL_TABLET | SUBLINGUAL | Status: DC | PRN

## 2012-06-11 NOTE — Telephone Encounter (Signed)
Pt needs appointment then refill can be made Fax Received. Refill Completed. Lawrence Delgado (R.M.A)   

## 2012-06-11 NOTE — Telephone Encounter (Signed)
Pt  out of nitro and didn't realize it, can he get rx called in asap? They deliver only one time a day and has to be called in by 2p at [facility] in [place]

## 2012-08-25 ENCOUNTER — Ambulatory Visit: Payer: Medicare Other | Admitting: Cardiology

## 2012-09-24 ENCOUNTER — Ambulatory Visit: Payer: Medicare Other | Admitting: Cardiology

## 2012-11-05 ENCOUNTER — Ambulatory Visit: Payer: Medicare Other | Admitting: Cardiology

## 2012-11-18 ENCOUNTER — Telehealth: Payer: Self-pay | Admitting: Cardiology

## 2012-11-18 NOTE — Telephone Encounter (Signed)
Spoke with pt, he has a dry cough keeping him awake at night. He thinks it is allergy related. Okay given for pt to use robitussin dm, Claritin, zyrtec or benadryl for symptoms.

## 2012-11-18 NOTE — Telephone Encounter (Signed)
New problem   Pt started coughing few nights ago. Pt want to know what type of cough medication can he take. Please call pt

## 2012-12-09 ENCOUNTER — Ambulatory Visit: Payer: Medicare Other | Admitting: Cardiology

## 2013-01-12 ENCOUNTER — Ambulatory Visit: Payer: Medicare Other | Admitting: Cardiology

## 2013-02-23 ENCOUNTER — Telehealth: Payer: Self-pay | Admitting: Cardiology

## 2013-02-23 NOTE — Telephone Encounter (Signed)
Spoke with pt, he has taken lipitor in the past but was unable to tolerate. Explained that if he changed to a generic besides lipitor he would not be able to get his numbers down to goal. Pt voiced understanding, he will cont on the crestor for now. Offered pt samples if available when he is coming to [facility].

## 2013-02-23 NOTE — Telephone Encounter (Signed)
New Prob  Pt wants to know if his Crestor can be switched to a generic brand.

## 2013-04-01 ENCOUNTER — Ambulatory Visit: Payer: Medicare Other | Admitting: Cardiology

## 2013-04-05 ENCOUNTER — Other Ambulatory Visit: Payer: Self-pay | Admitting: *Deleted

## 2013-04-05 MED ORDER — ROSUVASTATIN CALCIUM 40 MG PO TABS: 40.0000 mg | ORAL_TABLET | Freq: Every day | ORAL | Status: DC

## 2013-04-12 ENCOUNTER — Other Ambulatory Visit: Payer: Self-pay | Admitting: Cardiology

## 2013-05-07 ENCOUNTER — Telehealth: Payer: Self-pay | Admitting: Cardiology

## 2013-05-07 NOTE — Telephone Encounter (Signed)
New message    Ask question about getting a note saying he has a heart problem

## 2013-05-07 NOTE — Telephone Encounter (Signed)
Spoke with pt, aware he can get a copy of his office note from his visit 05-13-13. The pt states he needs something by Tuesday. Explained to pt he has not been seen since 2012 and will not be able to give him anything until he is seen. Pt voiced understanding

## 2013-05-10 ENCOUNTER — Other Ambulatory Visit: Payer: Self-pay | Admitting: Cardiology

## 2013-05-13 ENCOUNTER — Encounter: Payer: Self-pay | Admitting: *Deleted

## 2013-05-13 ENCOUNTER — Encounter: Payer: Self-pay | Admitting: Cardiology

## 2013-05-13 ENCOUNTER — Ambulatory Visit (INDEPENDENT_AMBULATORY_CARE_PROVIDER_SITE_OTHER): Payer: Medicare Other | Admitting: Cardiology

## 2013-05-13 VITALS — BP 152/88 | HR 72 | Ht 73.0 in | Wt 283.0 lb

## 2013-05-13 DIAGNOSIS — I2510 Atherosclerotic heart disease of native coronary artery without angina pectoris: Secondary | ICD-10-CM

## 2013-05-13 LAB — LIPID PANEL
Cholesterol: 142 mg/dL (ref 0–200)
LDL Cholesterol: 63 mg/dL (ref 0–99)
Total CHOL/HDL Ratio: 4
VLDL: 39.4 mg/dL (ref 0.0–40.0)

## 2013-05-13 LAB — HEPATIC FUNCTION PANEL
AST: 26 U/L (ref 0–37)
Alkaline Phosphatase: 28 U/L — ABNORMAL LOW (ref 39–117)
Bilirubin, Direct: 0.1 mg/dL (ref 0.0–0.3)
Total Bilirubin: 0.7 mg/dL (ref 0.3–1.2)
Total Protein: 7.5 g/dL (ref 6.0–8.3)

## 2013-05-13 LAB — BASIC METABOLIC PANEL
BUN: 10 mg/dL (ref 6–23)
CO2: 29 mEq/L (ref 19–32)
Chloride: 99 mEq/L (ref 96–112)
Creatinine, Ser: 1.1 mg/dL (ref 0.4–1.5)
Glucose, Bld: 259 mg/dL — ABNORMAL HIGH (ref 70–99)
Potassium: 3.7 mEq/L (ref 3.5–5.1)

## 2013-05-13 MED ORDER — METOPROLOL SUCCINATE ER 50 MG PO TB24: 50.0000 mg | ORAL_TABLET | Freq: Every day | ORAL | Status: DC

## 2013-05-13 NOTE — Assessment & Plan Note (Addendum)
Continue aspirin and statin. Last nuclear study suggested reduced LV function. Plan echocardiogram to reassess.

## 2013-05-13 NOTE — Progress Notes (Signed)
HPI: 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 Echocardiogram May 2010 was technically difficult. The LV function was felt to be 65-70%. Myoview in February 2013 showed an ejection fraction of 41%. There was an anterior wall infarct but no ischemia. I last saw him in August 2012. Since then, the patient has dyspnea with more extreme activities but not with routine activities. It is relieved with rest. It is not associated with chest pain. There is no orthopnea, PND or pedal edema. There is no syncope or palpitations. There is no exertional chest pain.     Current Outpatient Prescriptions  Medication Sig Dispense Refill  . aspirin 325 MG tablet Take 325 mg by mouth daily.        . fenofibrate (TRICOR) 145 MG tablet Take 1 tablet (145 mg total) by mouth daily.  30 tablet  12  . insulin aspart (NOVOLOG) 100 UNIT/ML injection Sliding scale       . insulin NPH-insulin regular (NOVOLIN 70/30) (70-30) 100 UNIT/ML injection as directed.        . irbesartan (AVAPRO) 150 MG tablet TAKE ONE TABLET AT BEDTIME  30 tablet  2  . LORazepam (ATIVAN) 1 MG tablet Take 1 mg by mouth every 8 (eight) hours as needed.        . metFORMIN (GLUCOPHAGE) 500 MG tablet Take 500 mg by mouth 2 (two) times daily with a meal.       . metoprolol succinate (TOPROL-XL) 25 MG 24 hr tablet Take 1 tablet (25 mg total) by mouth daily.  30 tablet  11  . nitroGLYCERIN (NITROSTAT) 0.4 MG SL tablet Place 1 tablet (0.4 mg total) under the tongue every 5 (five) minutes as needed.  25 tablet  5  . rosuvastatin (CRESTOR) 40 MG tablet Take 1 tablet (40 mg total) by mouth daily.  30 tablet  6  . Vitamin D, Ergocalciferol, (DRISDOL) 50000 UNITS CAPS Take 50,000 Units by mouth every 7 (seven) days.         No current facility-administered medications for this visit.     Past Medical History  Diagnosis Date  . CAD (coronary artery disease)     . GERD (gastroesophageal reflux disease)   . DM (diabetes mellitus)   . HLD (hyperlipidemia)   . HTN (hypertension)   . Sleep apnea   . Obesity     Past Surgical History  Procedure Laterality Date  . Coronary stent placement      eluting stent placement in the culpric lesion of the LAD  . Cardiac catheterization      with coronary angiography     History   Social History  . Marital Status: Married    Spouse Name: N/A    Number of Children: N/A  . Years of Education: N/A   Occupational History  . Not on file.   Social History Main Topics  . Smoking status: Former Games developer  . Smokeless tobacco: Not on file  . Alcohol Use: Not on file  . Drug Use: Not on file  . Sexual Activity: Not on file   Other Topics Concern  . Not on file   Social History Narrative  . No narrative on file    ROS: no fevers or chills, productive cough, hemoptysis, dysphasia, odynophagia, melena, hematochezia, dysuria, hematuria, rash, seizure activity, orthopnea, PND, pedal edema, claudication. Remaining systems are negative.  Physical  Exam: Well-developed obese in no acute distress.  Skin is warm and dry.  HEENT is normal.  Neck is supple.  Chest is clear to auscultation with normal expansion.  Cardiovascular exam is regular rate and rhythm.  Abdominal exam nontender or distended. No masses palpated. Extremities show no edema. neuro grossly intact  ECG sinus rhythm at a rate of 72. No ST changes.

## 2013-05-13 NOTE — Assessment & Plan Note (Signed)
Blood pressure mildly elevated. Increase Toprol to 50 mg daily. Check potassium and renal function.

## 2013-05-13 NOTE — Patient Instructions (Signed)
Your physician wants you to follow-up in: ONE YEAR WITH DR Shelda Pal will receive a reminder letter in the mail two months in advance. If you don't receive a letter, please call our office to schedule the follow-up appointment.   Your physician has requested that you have an echocardiogram. Echocardiography is a painless test that uses sound waves to create images of your heart. It provides your doctor with information about the size and shape of your heart and how well your heart's chambers and valves are working. This procedure takes approximately one hour. There are no restrictions for this procedure.SCHEDULE AT [facility]  INCREASE METOPROLOL TO 50 MG ONCE DAILY  Your physician recommends that you HAVE LAB WORK TODAY

## 2013-05-13 NOTE — Assessment & Plan Note (Signed)
Continue statin. Check lipids and liver. 

## 2013-05-14 ENCOUNTER — Telehealth: Payer: Self-pay | Admitting: Cardiology

## 2013-05-14 NOTE — Telephone Encounter (Signed)
I spoke with the pt and made him aware of lab results.  The pt said he needs a letter faxed to ATTN: Carles Collet at [phone] in regards to him having a heart condition.  The pt needs this letter because he is in an assistance program that keeps his power turned on. The pt would like this letter faxed by Monday. I will forward this message to Stanton Kidney RN to discuss letter with Dr Jens Som on Monday.

## 2013-05-14 NOTE — Telephone Encounter (Signed)
New Problem:  Pt states he wants the results of his recent blood work.  Pt also states he needs a document stating he has a heart condition.

## 2013-05-17 ENCOUNTER — Encounter: Payer: Self-pay | Admitting: *Deleted

## 2013-05-17 ENCOUNTER — Ambulatory Visit (HOSPITAL_COMMUNITY): Payer: Medicare Other

## 2013-05-17 NOTE — Telephone Encounter (Signed)
Spoke with pt, copy of last office note placed in medical records for faxing to the number provided.

## 2013-05-27 ENCOUNTER — Other Ambulatory Visit (HOSPITAL_COMMUNITY): Payer: Medicare Other

## 2013-06-14 ENCOUNTER — Ambulatory Visit (HOSPITAL_COMMUNITY): Admission: RE | Admit: 2013-06-14 | Payer: Medicare Other | Source: Ambulatory Visit

## 2013-06-29 ENCOUNTER — Other Ambulatory Visit (HOSPITAL_COMMUNITY): Payer: Medicare Other

## 2013-07-05 ENCOUNTER — Ambulatory Visit (HOSPITAL_COMMUNITY): Payer: Medicare Other

## 2013-08-16 ENCOUNTER — Other Ambulatory Visit: Payer: Self-pay | Admitting: Cardiology

## 2013-08-17 ENCOUNTER — Other Ambulatory Visit: Payer: Self-pay

## 2013-08-17 MED ORDER — IRBESARTAN 150 MG PO TABS: ORAL_TABLET | ORAL | Status: DC

## 2013-09-07 ENCOUNTER — Other Ambulatory Visit (HOSPITAL_COMMUNITY): Payer: Medicare Other

## 2013-09-15 ENCOUNTER — Ambulatory Visit (HOSPITAL_COMMUNITY): Admission: RE | Admit: 2013-09-15 | Payer: Medicare Other | Source: Ambulatory Visit

## 2013-11-26 ENCOUNTER — Other Ambulatory Visit: Payer: Self-pay | Admitting: Cardiology

## 2014-03-10 ENCOUNTER — Other Ambulatory Visit: Payer: Self-pay | Admitting: Cardiology

## 2014-04-08 ENCOUNTER — Telehealth: Payer: Self-pay

## 2014-04-08 ENCOUNTER — Other Ambulatory Visit: Payer: Self-pay

## 2014-04-08 MED ORDER — ROSUVASTATIN CALCIUM 40 MG PO TABS: ORAL_TABLET | ORAL | Status: DC

## 2014-04-08 NOTE — Telephone Encounter (Signed)
Refill

## 2014-04-11 ENCOUNTER — Telehealth: Payer: Self-pay | Admitting: Cardiology

## 2014-04-11 NOTE — Telephone Encounter (Signed)
New message      Want to reschedule echo from February.  Will Dr Jens Som still want him to have the echo?  If yes, he want to have it done at the [place] office.

## 2014-04-11 NOTE — Telephone Encounter (Signed)
Spoke with pt, he would like to have the echo done at [facility]. Will have someone call to schedule for pt.

## 2014-04-15 ENCOUNTER — Ambulatory Visit (HOSPITAL_COMMUNITY)
Admission: RE | Admit: 2014-04-15 | Discharge: 2014-04-15 | Disposition: A | Discharge status: Home / Self Care | Payer: Medicare Other | Source: Ambulatory Visit | Attending: Cardiology | Admitting: Cardiology

## 2014-04-15 DIAGNOSIS — Z6835 Body mass index (BMI) 35.0-35.9, adult: Secondary | ICD-10-CM | POA: Insufficient documentation

## 2014-04-15 DIAGNOSIS — R0609 Other forms of dyspnea: Secondary | ICD-10-CM | POA: Diagnosis not present

## 2014-04-15 DIAGNOSIS — E785 Hyperlipidemia, unspecified: Secondary | ICD-10-CM | POA: Diagnosis not present

## 2014-04-15 DIAGNOSIS — I319 Disease of pericardium, unspecified: Secondary | ICD-10-CM

## 2014-04-15 DIAGNOSIS — E119 Type 2 diabetes mellitus without complications: Secondary | ICD-10-CM | POA: Insufficient documentation

## 2014-04-15 DIAGNOSIS — Z87891 Personal history of nicotine dependence: Secondary | ICD-10-CM | POA: Insufficient documentation

## 2014-04-15 DIAGNOSIS — R0989 Other specified symptoms and signs involving the circulatory and respiratory systems: Principal | ICD-10-CM | POA: Insufficient documentation

## 2014-04-15 DIAGNOSIS — E669 Obesity, unspecified: Secondary | ICD-10-CM | POA: Insufficient documentation

## 2014-04-15 DIAGNOSIS — I2510 Atherosclerotic heart disease of native coronary artery without angina pectoris: Secondary | ICD-10-CM

## 2014-04-15 DIAGNOSIS — I10 Essential (primary) hypertension: Secondary | ICD-10-CM | POA: Insufficient documentation

## 2014-04-15 NOTE — Progress Notes (Signed)
  Echocardiogram 2D Echocardiogram has been performed.  Lawrence Delgado 04/15/2014, 10:11 AM

## 2014-05-17 NOTE — Progress Notes (Signed)
HPI: 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 Echocardiogram May 2010 was technically difficult. The LV function was felt to be 65-70%. Myoview in February 2013 showed an ejection fraction of 41%. There was an anterior wall infarct but no ischemia. Echocardiogram repeated in September 2015 and was technically difficult. Ejection fraction 55%, mild left ventricular hypertrophy, may 2 diastolic dysfunction, moderate left atrial enlargement, small pericardial effusion. Since he was last seen,    Current Outpatient Prescriptions  Medication Sig Dispense Refill  . aspirin 325 MG tablet Take 325 mg by mouth daily.        . fenofibrate (TRICOR) 145 MG tablet Take 1 tablet (145 mg total) by mouth daily.  30 tablet  12  . insulin aspart (NOVOLOG) 100 UNIT/ML injection Sliding scale       . insulin NPH-insulin regular (NOVOLIN 70/30) (70-30) 100 UNIT/ML injection as directed.        . irbesartan (AVAPRO) 150 MG tablet TAKE ONE TABLET AT BEDTIME  30 tablet  1  . LORazepam (ATIVAN) 1 MG tablet Take 1 mg by mouth every 8 (eight) hours as needed.        . metFORMIN (GLUCOPHAGE) 500 MG tablet Take 500 mg by mouth 2 (two) times daily with a meal.       . metoprolol succinate (TOPROL-XL) 50 MG 24 hr tablet Take 1 tablet (50 mg total) by mouth daily.  30 tablet  11  . nitroGLYCERIN (NITROSTAT) 0.4 MG SL tablet Place 1 tablet (0.4 mg total) under the tongue every 5 (five) minutes as needed.  25 tablet  5  . rosuvastatin (CRESTOR) 40 MG tablet TAKE 1 TABLET DAILY  30 tablet  1  . Vitamin D, Ergocalciferol, (DRISDOL) 50000 UNITS CAPS Take 50,000 Units by mouth every 7 (seven) days.         No current facility-administered medications for this visit.     Past Medical History  Diagnosis Date  . CAD (coronary artery disease)   . GERD (gastroesophageal reflux disease)   . DM (diabetes mellitus)   . HLD  (hyperlipidemia)   . HTN (hypertension)   . Sleep apnea   . Obesity     Past Surgical History  Procedure Laterality Date  . Coronary stent placement      eluting stent placement in the culpric lesion of the LAD  . Cardiac catheterization      with coronary angiography     History   Social History  . Marital Status: Married    Spouse Name: N/A    Number of Children: N/A  . Years of Education: N/A   Occupational History  . Not on file.   Social History Main Topics  . Smoking status: Former Games developermoker  . Smokeless tobacco: Not on file  . Alcohol Use: Not on file  . Drug Use: Not on file  . Sexual Activity: Not on file   Other Topics Concern  . Not on file   Social History Narrative  . No narrative on file    ROS: no fevers or chills, productive cough, hemoptysis, dysphasia, odynophagia, melena, hematochezia, dysuria, hematuria, rash, seizure activity, orthopnea, PND, pedal edema, claudication. Remaining systems are negative.  Physical Exam: Well-developed well-nourished in no acute distress.  Skin is warm and dry.  HEENT is normal.  Neck is supple.  Chest is clear to auscultation with  normal expansion.  Cardiovascular exam is regular rate and rhythm.  Abdominal exam nontender or distended. No masses palpated. Extremities show no edema. neuro grossly intact  ECG     This encounter was created in error - please disregard.

## 2014-05-18 ENCOUNTER — Other Ambulatory Visit: Payer: Self-pay

## 2014-05-18 MED ORDER — ROSUVASTATIN CALCIUM 40 MG PO TABS
ORAL_TABLET | ORAL | Status: DC
Start: 2014-05-18 — End: 2015-07-03

## 2014-05-19 ENCOUNTER — Encounter: Payer: Medicare Other | Admitting: Cardiology

## 2014-06-07 NOTE — Progress Notes (Signed)
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 Myoview in February 2013 showed an ejection fraction of 41%. There was an anterior wall infarct but no ischemia. Echocardiogram September 2015 was technically difficult. Ejection fraction felt to be 55% with mild left ventricular enlargement and mild left ventricular hypertrophy. There was grade 2 diastolic dysfunction. Moderate left atrial enlargement. Small pericardial effusion. Since last seen   Current Outpatient Prescriptions  Medication Sig Dispense Refill  . aspirin 325 MG tablet Take 325 mg by mouth daily.      . fenofibrate (TRICOR) 145 MG tablet Take 1 tablet (145 mg total) by mouth daily. 30 tablet 12  . insulin aspart (NOVOLOG) 100 UNIT/ML injection Sliding scale     . insulin NPH-insulin regular (NOVOLIN 70/30) (70-30) 100 UNIT/ML injection as directed.      . irbesartan (AVAPRO) 150 MG tablet TAKE ONE TABLET AT BEDTIME 30 tablet 1  . LORazepam (ATIVAN) 1 MG tablet Take 1 mg by mouth every 8 (eight) hours as needed.      . metFORMIN (GLUCOPHAGE) 500 MG tablet Take 500 mg by mouth 2 (two) times daily with a meal.     . metoprolol succinate (TOPROL-XL) 50 MG 24 hr tablet Take 1 tablet (50 mg total) by mouth daily. 30 tablet 11  . nitroGLYCERIN (NITROSTAT) 0.4 MG SL tablet Place 1 tablet (0.4 mg total) under the tongue every 5 (five) minutes as needed. 25 tablet 5  . rosuvastatin (CRESTOR) 40 MG tablet TAKE 1 TABLET DAILY 90 tablet 3  . Vitamin D, Ergocalciferol, (DRISDOL) 50000 UNITS CAPS Take 50,000 Units by mouth every 7 (seven) days.       No current facility-administered medications for this visit.     Past Medical History  Diagnosis Date  . CAD (coronary artery disease)   . GERD (gastroesophageal reflux disease)   . DM (diabetes mellitus)   . HLD (hyperlipidemia)   . HTN (hypertension)   . Sleep apnea   . Obesity     Past Surgical History  Procedure  Laterality Date  . Coronary stent placement      eluting stent placement in the culpric lesion of the LAD  . Cardiac catheterization      with coronary angiography     History   Social History  . Marital Status: Married    Spouse Name: N/A    Number of Children: N/A  . Years of Education: N/A   Occupational History  . Not on file.   Social History Main Topics  . Smoking status: Former Games developermoker  . Smokeless tobacco: Not on file  . Alcohol Use: Not on file  . Drug Use: Not on file  . Sexual Activity: Not on file   Other Topics Concern  . Not on file   Social History Narrative  . No narrative on file    ROS: no fevers or chills, productive cough, hemoptysis, dysphasia, odynophagia, melena, hematochezia, dysuria, hematuria, rash, seizure activity, orthopnea, PND, pedal edema, claudication. Remaining systems are negative.  Physical Exam: Well-developed well-nourished in no acute distress.  Skin is warm and dry.  HEENT is normal.  Neck is supple.  Chest is clear to auscultation with normal expansion.  Cardiovascular exam is regular rate and rhythm.  Abdominal exam nontender or distended. No masses palpated. Extremities show no edema. neuro grossly intact  ECG     This encounter was created in error -  please disregard.

## 2014-06-10 ENCOUNTER — Encounter: Payer: Medicare Other | Admitting: Cardiology

## 2014-06-17 ENCOUNTER — Encounter: Payer: Self-pay | Admitting: Cardiology

## 2014-06-20 ENCOUNTER — Encounter: Payer: Self-pay | Admitting: Cardiology

## 2014-07-06 ENCOUNTER — Telehealth: Payer: Self-pay | Admitting: Cardiology

## 2014-07-06 NOTE — Progress Notes (Signed)
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 Echocardiogram May 2010 was technically difficult. The LV function was felt to be 65-70%. Myoview in February 2013 showed an ejection fraction of 41%. There was an anterior wall infarct but no ischemia. Echocardiogram September 2015 was technically difficult. Overall LV function felt probably normal. Grade 2 diastolic dysfunction. Moderate left atrial enlargement and small pericardial effusion. Since I last saw him,    Current Outpatient Prescriptions  Medication Sig Dispense Refill  . aspirin 325 MG tablet Take 325 mg by mouth daily.      . fenofibrate (TRICOR) 145 MG tablet Take 1 tablet (145 mg total) by mouth daily. 30 tablet 12  . insulin aspart (NOVOLOG) 100 UNIT/ML injection Sliding scale     . insulin NPH-insulin regular (NOVOLIN 70/30) (70-30) 100 UNIT/ML injection as directed.      . irbesartan (AVAPRO) 150 MG tablet TAKE ONE TABLET AT BEDTIME 30 tablet 1  . LORazepam (ATIVAN) 1 MG tablet Take 1 mg by mouth every 8 (eight) hours as needed.      . metFORMIN (GLUCOPHAGE) 500 MG tablet Take 500 mg by mouth 2 (two) times daily with a meal.     . metoprolol succinate (TOPROL-XL) 50 MG 24 hr tablet Take 1 tablet (50 mg total) by mouth daily. 30 tablet 11  . nitroGLYCERIN (NITROSTAT) 0.4 MG SL tablet Place 1 tablet (0.4 mg total) under the tongue every 5 (five) minutes as needed. 25 tablet 5  . rosuvastatin (CRESTOR) 40 MG tablet TAKE 1 TABLET DAILY 90 tablet 3  . Vitamin D, Ergocalciferol, (DRISDOL) 50000 UNITS CAPS Take 50,000 Units by mouth every 7 (seven) days.       No current facility-administered medications for this visit.     Past Medical History  Diagnosis Date  . CAD (coronary artery disease)   . GERD (gastroesophageal reflux disease)   . DM (diabetes mellitus)   . HLD (hyperlipidemia)   . HTN (hypertension)   . Sleep apnea   . Obesity     Past Surgical  History  Procedure Laterality Date  . Coronary stent placement      eluting stent placement in the culpric lesion of the LAD  . Cardiac catheterization      with coronary angiography     History   Social History  . Marital Status: Married    Spouse Name: N/A    Number of Children: N/A  . Years of Education: N/A   Occupational History  . Not on file.   Social History Main Topics  . Smoking status: Former Games developermoker  . Smokeless tobacco: Not on file  . Alcohol Use: Not on file  . Drug Use: Not on file  . Sexual Activity: Not on file   Other Topics Concern  . Not on file   Social History Narrative  . No narrative on file    ROS: no fevers or chills, productive cough, hemoptysis, dysphasia, odynophagia, melena, hematochezia, dysuria, hematuria, rash, seizure activity, orthopnea, PND, pedal edema, claudication. Remaining systems are negative.  Physical Exam: Well-developed well-nourished in no acute distress.  Skin is warm and dry.  HEENT is normal.  Neck is supple.  Chest is clear to auscultation with normal expansion.  Cardiovascular exam is regular rate and rhythm.  Abdominal exam nontender or distended. No masses palpated. Extremities show no edema. neuro grossly intact  ECG  This encounter was created in error - please disregard.

## 2014-07-06 NOTE — Telephone Encounter (Signed)
Returned call to patient he stated he had a episode of fast heart beat this morning.Stated he went to [employer] and got upset.Stated when he got back home his heart beat fast for about 5 min.Stated he feels ok at present.No fast heart beat.Stated he has never had fast heart before.No chest pain.Appointment moved up with Dr.Crenshaw to 07/11/14 at 12:15 pm.Advised to call back if he has any more fast heart beat.

## 2014-07-06 NOTE — Telephone Encounter (Signed)
Pt heart started beating real hard and he started sweating.This have never happen before,it have slowed down some.

## 2014-07-11 ENCOUNTER — Encounter: Payer: Medicare Other | Admitting: Cardiology

## 2014-08-12 NOTE — Progress Notes (Signed)
      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 Myoview in February 2013 showed an ejection fraction of 41%. There was an anterior wall infarct but no ischemia. Echo 9/15 tech diff.; EF 55, grade 2 diastolic dysfunction, moderate LAE; small pericardial effusion. Since I last saw him,   Current Outpatient Prescriptions  Medication Sig Dispense Refill  . aspirin 325 MG tablet Take 325 mg by mouth daily.      . fenofibrate (TRICOR) 145 MG tablet Take 1 tablet (145 mg total) by mouth daily. 30 tablet 12  . insulin aspart (NOVOLOG) 100 UNIT/ML injection Sliding scale     . insulin NPH-insulin regular (NOVOLIN 70/30) (70-30) 100 UNIT/ML injection as directed.      . irbesartan (AVAPRO) 150 MG tablet TAKE ONE TABLET AT BEDTIME 30 tablet 1  . LORazepam (ATIVAN) 1 MG tablet Take 1 mg by mouth every 8 (eight) hours as needed.      . metFORMIN (GLUCOPHAGE) 500 MG tablet Take 500 mg by mouth 2 (two) times daily with a meal.     . metoprolol succinate (TOPROL-XL) 50 MG 24 hr tablet Take 1 tablet (50 mg total) by mouth daily. 30 tablet 11  . nitroGLYCERIN (NITROSTAT) 0.4 MG SL tablet Place 1 tablet (0.4 mg total) under the tongue every 5 (five) minutes as needed. 25 tablet 5  . rosuvastatin (CRESTOR) 40 MG tablet TAKE 1 TABLET DAILY 90 tablet 3  . Vitamin D, Ergocalciferol, (DRISDOL) 50000 UNITS CAPS Take 50,000 Units by mouth every 7 (seven) days.       No current facility-administered medications for this visit.     Past Medical History  Diagnosis Date  . CAD (coronary artery disease)   . GERD (gastroesophageal reflux disease)   . DM (diabetes mellitus)   . HLD (hyperlipidemia)   . HTN (hypertension)   . Sleep apnea   . Obesity     Past Surgical History  Procedure Laterality Date  . Coronary stent placement      eluting stent placement in the culpric lesion of the LAD  . Cardiac catheterization      with coronary angiography       History   Social History  . Marital Status: Married    Spouse Name: N/A    Number of Children: N/A  . Years of Education: N/A   Occupational History  . Not on file.   Social History Main Topics  . Smoking status: Former Games developermoker  . Smokeless tobacco: Not on file  . Alcohol Use: Not on file  . Drug Use: Not on file  . Sexual Activity: Not on file   Other Topics Concern  . Not on file   Social History Narrative  . No narrative on file    ROS: no fevers or chills, productive cough, hemoptysis, dysphasia, odynophagia, melena, hematochezia, dysuria, hematuria, rash, seizure activity, orthopnea, PND, pedal edema, claudication. Remaining systems are negative.  Physical Exam: Well-developed well-nourished in no acute distress.  Skin is warm and dry.  HEENT is normal.  Neck is supple.  Chest is clear to auscultation with normal expansion.  Cardiovascular exam is regular rate and rhythm.  Abdominal exam nontender or distended. No masses palpated. Extremities show no edema. neuro grossly intact  ECG     This encounter was created in error - please disregard.

## 2014-08-15 ENCOUNTER — Encounter: Payer: Medicare Other | Admitting: Cardiology

## 2014-08-15 ENCOUNTER — Ambulatory Visit: Payer: Medicare Other | Admitting: Cardiology

## 2014-09-05 NOTE — Progress Notes (Signed)
      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 Myoview in February 2013 showed an ejection fraction of 41%. There was an anterior wall infarct but no ischemia. Echocardiogram repeated July 2015. It was technically difficult. LV function appeared to be normal with grade 2 diastolic dysfunction. Moderate left atrial enlargement. Small pericardial effusion. Since I last saw him,   Current Outpatient Prescriptions  Medication Sig Dispense Refill  . aspirin 325 MG tablet Take 325 mg by mouth daily.      . fenofibrate (TRICOR) 145 MG tablet Take 1 tablet (145 mg total) by mouth daily. 30 tablet 12  . insulin aspart (NOVOLOG) 100 UNIT/ML injection Sliding scale     . insulin NPH-insulin regular (NOVOLIN 70/30) (70-30) 100 UNIT/ML injection as directed.      . irbesartan (AVAPRO) 150 MG tablet TAKE ONE TABLET AT BEDTIME 30 tablet 1  . LORazepam (ATIVAN) 1 MG tablet Take 1 mg by mouth every 8 (eight) hours as needed.      . metFORMIN (GLUCOPHAGE) 500 MG tablet Take 500 mg by mouth 2 (two) times daily with a meal.     . metoprolol succinate (TOPROL-XL) 50 MG 24 hr tablet Take 1 tablet (50 mg total) by mouth daily. 30 tablet 11  . nitroGLYCERIN (NITROSTAT) 0.4 MG SL tablet Place 1 tablet (0.4 mg total) under the tongue every 5 (five) minutes as needed. 25 tablet 5  . rosuvastatin (CRESTOR) 40 MG tablet TAKE 1 TABLET DAILY 90 tablet 3  . Vitamin D, Ergocalciferol, (DRISDOL) 50000 UNITS CAPS Take 50,000 Units by mouth every 7 (seven) days.       No current facility-administered medications for this visit.     Past Medical History  Diagnosis Date  . CAD (coronary artery disease)   . GERD (gastroesophageal reflux disease)   . DM (diabetes mellitus)   . HLD (hyperlipidemia)   . HTN (hypertension)   . Sleep apnea   . Obesity     Past Surgical History  Procedure Laterality Date  . Coronary stent placement      eluting stent placement  in the culpric lesion of the LAD  . Cardiac catheterization      with coronary angiography     History   Social History  . Marital Status: Married    Spouse Name: N/A    Number of Children: N/A  . Years of Education: N/A   Occupational History  . Not on file.   Social History Main Topics  . Smoking status: Former Games developermoker  . Smokeless tobacco: Not on file  . Alcohol Use: Not on file  . Drug Use: Not on file  . Sexual Activity: Not on file   Other Topics Concern  . Not on file   Social History Narrative  . No narrative on file    ROS: no fevers or chills, productive cough, hemoptysis, dysphasia, odynophagia, melena, hematochezia, dysuria, hematuria, rash, seizure activity, orthopnea, PND, pedal edema, claudication. Remaining systems are negative.  Physical Exam: Well-developed well-nourished in no acute distress.  Skin is warm and dry.  HEENT is normal.  Neck is supple.  Chest is clear to auscultation with normal expansion.  Cardiovascular exam is regular rate and rhythm.  Abdominal exam nontender or distended. No masses palpated. Extremities show no edema. neuro grossly intact  ECG     This encounter was created in error - please disregard.

## 2014-09-06 ENCOUNTER — Encounter: Payer: Self-pay | Admitting: Cardiology

## 2014-11-01 NOTE — Progress Notes (Signed)
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 Echocardiogram May 2010 was technically difficult. The LV function was felt to be 65-70%. Myoview in February 2013 showed an ejection fraction of 41%. There was an anterior wall infarct but no ischemia. Echocardiogram September 2015 was technically difficult. Ejection fraction appeared to be 55%. Grade 2 diastolic dysfunction. Moderate left atrial enlargement and small pericardial effusion. Since last seen,   Current Outpatient Prescriptions  Medication Sig Dispense Refill  . aspirin 325 MG tablet Take 325 mg by mouth daily.      . fenofibrate (TRICOR) 145 MG tablet Take 1 tablet (145 mg total) by mouth daily. 30 tablet 12  . insulin aspart (NOVOLOG) 100 UNIT/ML injection Sliding scale     . insulin NPH-insulin regular (NOVOLIN 70/30) (70-30) 100 UNIT/ML injection as directed.      . irbesartan (AVAPRO) 150 MG tablet TAKE ONE TABLET AT BEDTIME 30 tablet 1  . LORazepam (ATIVAN) 1 MG tablet Take 1 mg by mouth every 8 (eight) hours as needed.      . metFORMIN (GLUCOPHAGE) 500 MG tablet Take 500 mg by mouth 2 (two) times daily with a meal.     . metoprolol succinate (TOPROL-XL) 50 MG 24 hr tablet Take 1 tablet (50 mg total) by mouth daily. 30 tablet 11  . nitroGLYCERIN (NITROSTAT) 0.4 MG SL tablet Place 1 tablet (0.4 mg total) under the tongue every 5 (five) minutes as needed. 25 tablet 5  . rosuvastatin (CRESTOR) 40 MG tablet TAKE 1 TABLET DAILY 90 tablet 3  . Vitamin D, Ergocalciferol, (DRISDOL) 50000 UNITS CAPS Take 50,000 Units by mouth every 7 (seven) days.       No current facility-administered medications for this visit.     Past Medical History  Diagnosis Date  . CAD (coronary artery disease)   . GERD (gastroesophageal reflux disease)   . DM (diabetes mellitus)   . HLD (hyperlipidemia)   . HTN (hypertension)   . Sleep apnea   . Obesity     Past Surgical History    Procedure Laterality Date  . Coronary stent placement      eluting stent placement in the culpric lesion of the LAD  . Cardiac catheterization      with coronary angiography     History   Social History  . Marital Status: Married    Spouse Name: N/A  . Number of Children: N/A  . Years of Education: N/A   Occupational History  . Not on file.   Social History Main Topics  . Smoking status: Former Games developermoker  . Smokeless tobacco: Not on file  . Alcohol Use: Not on file  . Drug Use: Not on file  . Sexual Activity: Not on file   Other Topics Concern  . Not on file   Social History Narrative  . No narrative on file    ROS: no fevers or chills, productive cough, hemoptysis, dysphasia, odynophagia, melena, hematochezia, dysuria, hematuria, rash, seizure activity, orthopnea, PND, pedal edema, claudication. Remaining systems are negative.  Physical Exam: Well-developed well-nourished in no acute distress.  Skin is warm and dry.  HEENT is normal.  Neck is supple.  Chest is clear to auscultation with normal expansion.  Cardiovascular exam is regular rate and rhythm.  Abdominal exam nontender or distended. No masses palpated. Extremities show no edema. neuro grossly intact  ECG     This encounter was  created in error - please disregard.

## 2014-11-03 ENCOUNTER — Encounter: Payer: Self-pay | Admitting: Cardiology

## 2014-12-14 ENCOUNTER — Ambulatory Visit (INDEPENDENT_AMBULATORY_CARE_PROVIDER_SITE_OTHER): Payer: Medicare Other | Admitting: Cardiology

## 2014-12-14 ENCOUNTER — Encounter: Payer: Self-pay | Admitting: Cardiology

## 2014-12-14 VITALS — BP 142/80 | HR 63 | Ht 73.0 in | Wt 268.0 lb

## 2014-12-14 DIAGNOSIS — I2510 Atherosclerotic heart disease of native coronary artery without angina pectoris: Secondary | ICD-10-CM

## 2014-12-14 MED ORDER — NITROGLYCERIN 0.4 MG SL SUBL
0.4000 mg | SUBLINGUAL_TABLET | SUBLINGUAL | Status: DC | PRN
Start: 2014-12-14 — End: 2015-09-13

## 2014-12-14 NOTE — Assessment & Plan Note (Signed)
Continue aspirin and statin. 

## 2014-12-14 NOTE — Patient Instructions (Signed)
Your physician wants you to follow-up in: ONE YEAR WITH DR CRENSHAW You will receive a reminder letter in the mail two months in advance. If you don't receive a letter, please call our office to schedule the follow-up appointment.  

## 2014-12-14 NOTE — Assessment & Plan Note (Signed)
Blood pressure borderline. I've asked him to follow this and we will increase medications as needed.

## 2014-12-14 NOTE — Progress Notes (Signed)
      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 Myoview in February 2013 showed an ejection fraction of 41%. There was an anterior wall infarct but no ischemia. Last echocardiogram September 2015 was technically difficult but ejection fraction appeared to be 55%. There was grade 2 diastolic dysfunction. Moderate left atrial enlargement. Small pericardial effusion. Since last seen, patient denies dyspnea on exertion, orthopnea, PND, pedal edema, palpitations, syncope or chest pain.  Current Outpatient Prescriptions  Medication Sig Dispense Refill  . aspirin 325 MG tablet Take 325 mg by mouth daily.      . fenofibrate (TRICOR) 145 MG tablet Take 1 tablet (145 mg total) by mouth daily. 30 tablet 12  . insulin lispro protamine-lispro (HUMALOG 75/25 MIX) (75-25) 100 UNIT/ML SUSP injection Inject 50 Units into the skin daily.    . irbesartan (AVAPRO) 150 MG tablet TAKE ONE TABLET AT BEDTIME 30 tablet 1  . LORazepam (ATIVAN) 1 MG tablet Take 1 mg by mouth every 8 (eight) hours as needed.      . metFORMIN (GLUCOPHAGE) 500 MG tablet Take 500 mg by mouth 2 (two) times daily with a meal.     . metoprolol succinate (TOPROL-XL) 50 MG 24 hr tablet Take 1 tablet (50 mg total) by mouth daily. 30 tablet 11  . nitroGLYCERIN (NITROSTAT) 0.4 MG SL tablet Place 1 tablet (0.4 mg total) under the tongue every 5 (five) minutes as needed. 25 tablet 5  . rosuvastatin (CRESTOR) 40 MG tablet TAKE 1 TABLET DAILY 90 tablet 3  . Vitamin D, Ergocalciferol, (DRISDOL) 50000 UNITS CAPS Take 50,000 Units by mouth every 7 (seven) days.       No current facility-administered medications for this visit.     Past Medical History  Diagnosis Date  . CAD (coronary artery disease)   . GERD (gastroesophageal reflux disease)   . DM (diabetes mellitus)   . HLD (hyperlipidemia)   . HTN (hypertension)   . Sleep apnea   . Obesity     Past Surgical History  Procedure  Laterality Date  . Coronary stent placement      eluting stent placement in the culpric lesion of the LAD  . Cardiac catheterization      with coronary angiography     History   Social History  . Marital Status: Married    Spouse Name: N/A  . Number of Children: N/A  . Years of Education: N/A   Occupational History  . Not on file.   Social History Main Topics  . Smoking status: Former Games developermoker  . Smokeless tobacco: Not on file  . Alcohol Use: Not on file  . Drug Use: Not on file  . Sexual Activity: Not on file   Other Topics Concern  . Not on file   Social History Narrative    ROS: no fevers or chills, productive cough, hemoptysis, dysphasia, odynophagia, melena, hematochezia, dysuria, hematuria, rash, seizure activity, orthopnea, PND, pedal edema, claudication. Remaining systems are negative.  Physical Exam: Well-developed well-nourished in no acute distress.  Skin is warm and dry.  HEENT is normal.  Neck is supple.  Chest is clear to auscultation with normal expansion.  Cardiovascular exam is regular rate and rhythm.  Abdominal exam nontender or distended. No masses palpated. Extremities show no edema. neuro grossly intact  ECG sinus rhythm at a rate of 64. No ST changes.

## 2014-12-14 NOTE — Assessment & Plan Note (Signed)
Continue statin. Recent FDA advisory suggesting combination of statin and TriCor, Lopid or niacin not appropriate unless triglycerides a very high. I've asked him to discuss this with his primary care physician. We would recommend continuing Crestor but potentially discontinuing TriCor unless primary care feels otherwise.

## 2014-12-28 ENCOUNTER — Telehealth: Payer: Self-pay | Admitting: *Deleted

## 2014-12-28 NOTE — Telephone Encounter (Signed)
Spoke with pt, Aware of dr crenshaw's recommendations.  °

## 2014-12-28 NOTE — Telephone Encounter (Signed)
[facility]icor [facility]

## 2014-12-28 NOTE — Telephone Encounter (Signed)
Spoke with pt, when he was last seen there was a question about him coming off the tricor. He wonders if he needs to continue taking. Will forward for dr Jens Somcrenshaw review

## 2015-01-27 ENCOUNTER — Telehealth: Payer: Self-pay | Admitting: Cardiology

## 2015-01-27 NOTE — Telephone Encounter (Signed)
Pt went to his primary doctor,she increased his Avapro and took him off Tri-cor. He wants to know what Dr Jens Somrenshaw thinks about this. He is also having a big problems with allergies. Can he take Claratin D,non drowsy? Please call before 1,if possible.

## 2015-01-27 NOTE — Telephone Encounter (Signed)
Spoke with pt, aware okay for medication changes. Okay to take calaritin

## 2015-03-14 ENCOUNTER — Telehealth: Payer: Self-pay | Admitting: Cardiology

## 2015-03-14 NOTE — Telephone Encounter (Signed)
Pt no longer takes Tri-Cor. He have not felt that good since he stopped taking it and his blood pressure is running high.

## 2015-03-14 NOTE — Telephone Encounter (Signed)
Change avapro to 300 mg daily; bmet one week Olga Millers

## 2015-03-14 NOTE — Telephone Encounter (Signed)
Was seen by Dr. Samuel Jester today his PCP.  Patient said his blood pressure was 179/79 but she didn't want to change anything that Dr. Jens Som has done with his blood pressure mefications  Said it has been at least 6 months since he has been off Tri-cor and he hasn't felt good since he came off it.

## 2015-03-15 NOTE — Addendum Note (Signed)
Addended by: Freddi Starr on: 03/15/2015 09:29 AM   Modules accepted: Orders

## 2015-03-15 NOTE — Telephone Encounter (Signed)
Spoke with pt, he reports dr Charm Barges increased his avapro to 300 mg about 2 months ago. He does not check his pressure at home. When he was seen yesterday he was having a lot of trouble with his teeth. He feels the elevation may have been related to that. He is going to track his bp at home and let us know if cont to be elevated.

## 2015-04-04 ENCOUNTER — Telehealth: Payer: Self-pay | Admitting: Cardiology

## 2015-04-04 NOTE — Telephone Encounter (Signed)
Pt's wife called in wanting to speak with a nurse about the pt's heart rate is elevated and she feels that it is due to some yard work and possibly over exerting himself on Saturday. She wants to know if the pt needs to be checked out. Please call   Thanks

## 2015-04-04 NOTE — Telephone Encounter (Signed)
Patient said that ever since he was out shoveling on Saturday when he exerts himself he can feel his heart beat in his neck normally and then there will be a few rapid beats and go back to normal  Does not know what is heart rate is and does not have a B/P cuff right now but is working on getting one  Denies shortness of breath or chest pain.  Not feeling good since tri-cor discontinued and Avapro increased to 300 mg  Will route to Dr. Jens Som

## 2015-04-04 NOTE — Telephone Encounter (Signed)
If symptoms persist, can have patient come for ECG to check rhythm. Olga Millers

## 2015-04-04 NOTE — Telephone Encounter (Signed)
Patient said if he isn't feeling better by Thursday he will come in for an ECG

## 2015-04-11 ENCOUNTER — Telehealth: Payer: Self-pay | Admitting: Cardiology

## 2015-04-11 NOTE — Telephone Encounter (Signed)
Spoke with pt, his avapro was increase to 300 mg by dr Charm Barges and his bp is still running high. At his office visit with dr butler his bp was 179/84. He is also c/o swelling in his feet, R>L, there is none in the morning but by the end of the day they are swollen and painful. He denies SOB but reports being tired more than his usual, he also has allergies. Pt does not have a way of checking his bp at home. He is going to go have his bp checked 2 to 3 times and then call back and let us know how it is running. Referred pt to dr butler regarding his elevated blood sugars and swelling. Pt agreed with this plan.

## 2015-04-11 NOTE — Telephone Encounter (Signed)
Lawrence Delgado is calling abaiut Lawrence Delgado medication. They took him off of his Tricor and up the dosage of the Avapro to  and it does not seem to be working , please call   Thanks

## 2015-04-14 ENCOUNTER — Telehealth: Payer: Self-pay | Admitting: Cardiology

## 2015-04-14 NOTE — Telephone Encounter (Signed)
New Prob ° ° °Pt has some questions regarding his Crestor medication. Please call. °

## 2015-04-14 NOTE — Telephone Encounter (Signed)
Pt wanting to know if OK to take rosuvastatin filled by pharmacy. Advised that this is the generic version of Crestor and this is OK. Confirmed he was prescribed equivalent dose.  No further questions from patient, he voiced understanding.

## 2015-04-24 ENCOUNTER — Telehealth: Payer: Self-pay | Admitting: Cardiology

## 2015-04-24 DIAGNOSIS — R0602 Shortness of breath: Secondary | ICD-10-CM

## 2015-04-24 NOTE — Telephone Encounter (Signed)
Lasix 20 mg daily , bmet one week, fu paov [facility]

## 2015-04-24 NOTE — Telephone Encounter (Signed)
Spoke with pt, his bp is running 138-115/77-86, pulse 85 to 55. For about 2 weeks now he has had SOB with exertion, swelling in his feet and productive cough of clear to yellow sputum. No fevers. He feels congested in the morning and feels this is due to allergies. He also feels smothered when lying down but will go away and he is able to lie flat. He does not wake up SOB. He wants to make dr Jens Som aware. Will forward for dr Jens Som review

## 2015-04-24 NOTE — Telephone Encounter (Signed)
Please call,he wants to give you his blood pressure readings. Also says he is congested in his chest and can not breathe at night.He feels like he is smothering at night.

## 2015-04-26 MED ORDER — FUROSEMIDE 20 MG PO TABS: 20.0000 mg | ORAL_TABLET | Freq: Every day | ORAL | Status: DC

## 2015-04-26 NOTE — Telephone Encounter (Signed)
Spoke with pt, Aware of dr Ludwig Clarks recommendations.  Script sent to the pharmacy, lab orders mailed to the pt for labs in one week. Follow up scheduled with dr Jens Som next available in [place]. Pt refused to be seen in a different location. He has no way to get to [facility].

## 2015-05-02 ENCOUNTER — Telehealth: Payer: Self-pay | Admitting: Cardiology

## 2015-05-02 ENCOUNTER — Inpatient Hospital Stay (HOSPITAL_COMMUNITY)
Admission: EM | Admit: 2015-05-02 | Discharge: 2015-05-07 | DRG: 308 | Disposition: A | Discharge status: Home / Self Care | Payer: Medicare Other | Attending: Cardiology | Admitting: Cardiology

## 2015-05-02 ENCOUNTER — Encounter (HOSPITAL_COMMUNITY): Payer: Self-pay

## 2015-05-02 ENCOUNTER — Emergency Department (HOSPITAL_COMMUNITY): Payer: Medicare Other

## 2015-05-02 DIAGNOSIS — Z79899 Other long term (current) drug therapy: Secondary | ICD-10-CM | POA: Diagnosis not present

## 2015-05-02 DIAGNOSIS — I4891 Unspecified atrial fibrillation: Secondary | ICD-10-CM

## 2015-05-02 DIAGNOSIS — E119 Type 2 diabetes mellitus without complications: Secondary | ICD-10-CM | POA: Diagnosis present

## 2015-05-02 DIAGNOSIS — I5043 Acute on chronic combined systolic (congestive) and diastolic (congestive) heart failure: Secondary | ICD-10-CM | POA: Diagnosis present

## 2015-05-02 DIAGNOSIS — I10 Essential (primary) hypertension: Secondary | ICD-10-CM | POA: Diagnosis present

## 2015-05-02 DIAGNOSIS — Z888 Allergy status to other drugs, medicaments and biological substances status: Secondary | ICD-10-CM | POA: Diagnosis not present

## 2015-05-02 DIAGNOSIS — I110 Hypertensive heart disease with heart failure: Secondary | ICD-10-CM | POA: Diagnosis present

## 2015-05-02 DIAGNOSIS — Z7984 Long term (current) use of oral hypoglycemic drugs: Secondary | ICD-10-CM | POA: Diagnosis not present

## 2015-05-02 DIAGNOSIS — R0902 Hypoxemia: Secondary | ICD-10-CM | POA: Diagnosis present

## 2015-05-02 DIAGNOSIS — I2510 Atherosclerotic heart disease of native coronary artery without angina pectoris: Secondary | ICD-10-CM | POA: Diagnosis present

## 2015-05-02 DIAGNOSIS — I429 Cardiomyopathy, unspecified: Secondary | ICD-10-CM | POA: Diagnosis present

## 2015-05-02 DIAGNOSIS — R0600 Dyspnea, unspecified: Secondary | ICD-10-CM | POA: Insufficient documentation

## 2015-05-02 DIAGNOSIS — I480 Paroxysmal atrial fibrillation: Secondary | ICD-10-CM | POA: Diagnosis not present

## 2015-05-02 DIAGNOSIS — I252 Old myocardial infarction: Secondary | ICD-10-CM | POA: Diagnosis not present

## 2015-05-02 DIAGNOSIS — G4730 Sleep apnea, unspecified: Secondary | ICD-10-CM | POA: Diagnosis present

## 2015-05-02 DIAGNOSIS — Z885 Allergy status to narcotic agent status: Secondary | ICD-10-CM

## 2015-05-02 DIAGNOSIS — Z794 Long term (current) use of insulin: Secondary | ICD-10-CM | POA: Diagnosis not present

## 2015-05-02 DIAGNOSIS — I5021 Acute systolic (congestive) heart failure: Secondary | ICD-10-CM | POA: Diagnosis not present

## 2015-05-02 DIAGNOSIS — K219 Gastro-esophageal reflux disease without esophagitis: Secondary | ICD-10-CM | POA: Diagnosis present

## 2015-05-02 DIAGNOSIS — Z955 Presence of coronary angioplasty implant and graft: Secondary | ICD-10-CM | POA: Diagnosis not present

## 2015-05-02 DIAGNOSIS — E785 Hyperlipidemia, unspecified: Secondary | ICD-10-CM | POA: Diagnosis present

## 2015-05-02 DIAGNOSIS — Z87891 Personal history of nicotine dependence: Secondary | ICD-10-CM

## 2015-05-02 DIAGNOSIS — Z7982 Long term (current) use of aspirin: Secondary | ICD-10-CM

## 2015-05-02 DIAGNOSIS — Z6835 Body mass index (BMI) 35.0-35.9, adult: Secondary | ICD-10-CM | POA: Diagnosis not present

## 2015-05-02 DIAGNOSIS — J90 Pleural effusion, not elsewhere classified: Secondary | ICD-10-CM | POA: Insufficient documentation

## 2015-05-02 DIAGNOSIS — Z23 Encounter for immunization: Secondary | ICD-10-CM | POA: Diagnosis not present

## 2015-05-02 DIAGNOSIS — I340 Nonrheumatic mitral (valve) insufficiency: Secondary | ICD-10-CM | POA: Diagnosis not present

## 2015-05-02 DIAGNOSIS — R0689 Other abnormalities of breathing: Secondary | ICD-10-CM

## 2015-05-02 LAB — BASIC METABOLIC PANEL
Anion gap: 11 (ref 5–15)
BUN: 12 mg/dL (ref 6–20)
CHLORIDE: 102 mmol/L (ref 101–111)
CO2: 24 mmol/L (ref 22–32)
Calcium: 9.4 mg/dL (ref 8.9–10.3)
Creatinine, Ser: 1 mg/dL (ref 0.61–1.24)
GFR calc Af Amer: >60 mL/min (ref >60)
Glucose, Bld: 255 mg/dL — ABNORMAL HIGH (ref 65–99)
POTASSIUM: 4.6 mmol/L (ref 3.5–5.1)
SODIUM: 137 mmol/L (ref 135–145)

## 2015-05-02 LAB — GLUCOSE, CAPILLARY: GLUCOSE-CAPILLARY: 233 mg/dL — AB (ref 65–99)

## 2015-05-02 LAB — BRAIN NATRIURETIC PEPTIDE
B Natriuretic Peptide: 225.9 pg/mL — ABNORMAL HIGH (ref 0.0–100.0)
B Natriuretic Peptide: 247.9 pg/mL — ABNORMAL HIGH (ref 0.0–100.0)

## 2015-05-02 LAB — CBC WITH DIFFERENTIAL/PLATELET
BASOS ABS: 0 10*3/uL (ref 0.0–0.1)
Basophils Relative: 0 %
EOS ABS: 0.1 10*3/uL (ref 0.0–0.7)
EOS PCT: 1 %
HCT: 45.3 % (ref 39.0–52.0)
HEMOGLOBIN: 15.4 g/dL (ref 13.0–17.0)
Lymphocytes Relative: 10 %
Lymphs Abs: 0.8 10*3/uL (ref 0.7–4.0)
MCH: 29.4 pg (ref 26.0–34.0)
MCHC: 34 g/dL (ref 30.0–36.0)
MCV: 86.6 fL (ref 78.0–100.0)
Monocytes Absolute: 0.6 10*3/uL (ref 0.1–1.0)
Monocytes Relative: 8 %
NEUTROS PCT: 81 %
Neutro Abs: 6.6 10*3/uL (ref 1.7–7.7)
PLATELETS: 261 10*3/uL (ref 150–400)
RBC: 5.23 MIL/uL (ref 4.22–5.81)
RDW: 12.6 % (ref 11.5–15.5)
WBC: 8.2 10*3/uL (ref 4.0–10.5)

## 2015-05-02 LAB — I-STAT TROPONIN, ED: TROPONIN I, POC: 0 ng/mL (ref 0.00–0.08)

## 2015-05-02 LAB — TSH
TSH: 0.724 u[IU]/mL (ref 0.350–4.500)
TSH: 0.672 u[IU]/mL (ref 0.350–4.500)

## 2015-05-02 LAB — MAGNESIUM: MAGNESIUM: 1.4 mg/dL — AB (ref 1.7–2.4)

## 2015-05-02 LAB — T4, FREE: FREE T4: 1.13 ng/dL — AB (ref 0.61–1.12)

## 2015-05-02 LAB — MRSA PCR SCREENING: MRSA by PCR: NEGATIVE

## 2015-05-02 MED ORDER — FUROSEMIDE 10 MG/ML IJ SOLN
20.0000 mg | Freq: Once | INTRAMUSCULAR | Status: AC
  Administered 2015-05-02: 20 mg via INTRAVENOUS
  Filled 2015-05-02: qty 2

## 2015-05-02 MED ORDER — LORAZEPAM 1 MG PO TABS: 1.0000 mg | ORAL_TABLET | Freq: Three times a day (TID) | ORAL | Status: DC | PRN

## 2015-05-02 MED ORDER — INSULIN ASPART 100 UNIT/ML ~~LOC~~ SOLN
0.0000 [IU] | Freq: Three times a day (TID) | SUBCUTANEOUS | Status: DC
  Administered 2015-05-03: 11 [IU] via SUBCUTANEOUS
  Administered 2015-05-03: 5 [IU] via SUBCUTANEOUS
  Administered 2015-05-03: 3 [IU] via SUBCUTANEOUS
  Administered 2015-05-04: 11 [IU] via SUBCUTANEOUS
  Administered 2015-05-04 – 2015-05-05 (×4): 8 [IU] via SUBCUTANEOUS
  Administered 2015-05-06: 11 [IU] via SUBCUTANEOUS
  Administered 2015-05-06: 8 [IU] via SUBCUTANEOUS
  Administered 2015-05-06: 11 [IU] via SUBCUTANEOUS

## 2015-05-02 MED ORDER — METOPROLOL TARTRATE 1 MG/ML IV SOLN
10.0000 mg | Freq: Once | INTRAVENOUS | Status: AC
  Administered 2015-05-02: 10 mg via INTRAVENOUS
  Filled 2015-05-02: qty 10

## 2015-05-02 MED ORDER — DILTIAZEM HCL 100 MG IV SOLR
5.0000 mg/h | INTRAVENOUS | Status: DC
  Administered 2015-05-05: 15 mg/h via INTRAVENOUS
  Filled 2015-05-02 (×2): qty 100

## 2015-05-02 MED ORDER — INFLUENZA VAC SPLIT QUAD 0.5 ML IM SUSY
0.5000 mL | PREFILLED_SYRINGE | INTRAMUSCULAR | Status: AC
  Administered 2015-05-07: 0.5 mL via INTRAMUSCULAR
  Filled 2015-05-02 (×2): qty 0.5

## 2015-05-02 MED ORDER — CETYLPYRIDINIUM CHLORIDE 0.05 % MT LIQD
7.0000 mL | Freq: Two times a day (BID) | OROMUCOSAL | Status: DC
  Administered 2015-05-02 – 2015-05-07 (×9): 7 mL via OROMUCOSAL

## 2015-05-02 MED ORDER — FUROSEMIDE 10 MG/ML IJ SOLN
40.0000 mg | Freq: Every day | INTRAMUSCULAR | Status: DC
  Administered 2015-05-02 – 2015-05-06 (×5): 40 mg via INTRAVENOUS
  Filled 2015-05-02 (×5): qty 4

## 2015-05-02 MED ORDER — ROSUVASTATIN CALCIUM 10 MG PO TABS
40.0000 mg | ORAL_TABLET | Freq: Every day | ORAL | Status: DC
  Administered 2015-05-02 – 2015-05-06 (×5): 40 mg via ORAL
  Filled 2015-05-02 (×5): qty 4

## 2015-05-02 MED ORDER — DILTIAZEM HCL 100 MG IV SOLR
5.0000 mg/h | INTRAVENOUS | Status: DC
  Administered 2015-05-02: 5 mg/h via INTRAVENOUS
  Administered 2015-05-03 (×2): 15 mg/h via INTRAVENOUS
  Administered 2015-05-04: 12.5 mg/h via INTRAVENOUS
  Administered 2015-05-04 – 2015-05-05 (×3): 15 mg/h via INTRAVENOUS
  Filled 2015-05-02 (×9): qty 100

## 2015-05-02 MED ORDER — APIXABAN 5 MG PO TABS
5.0000 mg | ORAL_TABLET | Freq: Two times a day (BID) | ORAL | Status: DC
  Administered 2015-05-02 – 2015-05-07 (×10): 5 mg via ORAL
  Filled 2015-05-02 (×10): qty 1

## 2015-05-02 MED ORDER — METOPROLOL TARTRATE 1 MG/ML IV SOLN: 10.0000 mg | Freq: Once | INTRAVENOUS | Status: DC

## 2015-05-02 MED ORDER — INSULIN ASPART PROT & ASPART (70-30 MIX) 100 UNIT/ML ~~LOC~~ SUSP
45.0000 [IU] | Freq: Every day | SUBCUTANEOUS | Status: DC
  Administered 2015-05-03 – 2015-05-04 (×2): 45 [IU] via SUBCUTANEOUS
  Filled 2015-05-02: qty 10

## 2015-05-02 MED ORDER — METOPROLOL SUCCINATE ER 50 MG PO TB24: 50.0000 mg | ORAL_TABLET | Freq: Every day | ORAL | Status: DC

## 2015-05-02 MED ORDER — DILTIAZEM LOAD VIA INFUSION
20.0000 mg | Freq: Once | INTRAVENOUS | Status: AC
  Administered 2015-05-02: 20 mg via INTRAVENOUS
  Filled 2015-05-02: qty 20

## 2015-05-02 NOTE — Telephone Encounter (Signed)
Follow up      Pt did not go to the hosp---calling to see if Dr Jens Som want to see him---heart racing

## 2015-05-02 NOTE — Telephone Encounter (Signed)
Spoke to patient he states he lives in [place] He waiting for a ride to [facility] hospital  HE wants to know if there is any medication may take while he waits.  RN informed patient will discuss with D.O.D. Patient states he is not having any symptoms at present time.  RN spoke to Dr Duke Salvia, previous EKG from Dr Silvana Newness office reviewed--per order new or additional medications Instruct patient to go to hospital   Patient states he will go to hospital once he gets a ride- He states if he does go today he will get there tomorrow

## 2015-05-02 NOTE — Telephone Encounter (Signed)
See previous telephone call

## 2015-05-02 NOTE — Telephone Encounter (Signed)
New message     FYI Calling to let Dr Jens Som know that pt is on his way to [facility] with AFIB

## 2015-05-02 NOTE — ED Notes (Signed)
Attempted report x1. 

## 2015-05-02 NOTE — ED Provider Notes (Signed)
CSN: [id number]     Arrival date & time 05/02/15  1444 History   None    Chief Complaint  Patient presents with  . Atrial Fibrillation   Patient is a 59 y.o. male presenting with atrial fibrillation.  Atrial Fibrillation This is a new problem. Episode onset: Unknown. The problem occurs constantly. The problem has been unchanged. Associated symptoms include coughing. Pertinent negatives include no abdominal pain, chest pain, congestion, diaphoresis, fatigue, fever, vomiting or weakness. Associated symptoms comments: Wheezing .   Past Medical History  Diagnosis Date  . CAD (coronary artery disease)   . GERD (gastroesophageal reflux disease)   . DM (diabetes mellitus) (HCC)   . HLD (hyperlipidemia)   . HTN (hypertension)   . Sleep apnea   . Obesity    Past Surgical History  Procedure Laterality Date  . Coronary stent placement      eluting stent placement in the culpric lesion of the LAD  . Cardiac catheterization      with coronary angiography    Family History  Problem Relation Age of Onset  . Heart attack Mother   . Heart attack Father    Social History  Substance Use Topics  . Smoking status: Former Games developer  . Smokeless tobacco: None  . Alcohol Use: No    Review of Systems  Constitutional: Negative for fever, diaphoresis and fatigue.  HENT: Negative for congestion.   Respiratory: Positive for cough, shortness of breath and wheezing.   Cardiovascular: Positive for leg swelling. Negative for chest pain.  Gastrointestinal: Negative for vomiting and abdominal pain.  Neurological: Negative for weakness.  All other systems reviewed and are negative.   Allergies  Clopidogrel bisulfate and Codeine  Home Medications   Prior to Admission medications   Medication Sig Start Date End Date Taking? Authorizing Provider  aspirin 325 MG tablet Take 325 mg by mouth daily.      Historical Provider, MD  furosemide (LASIX) 20 MG tablet Take 1 tablet (20 mg total) by mouth daily.  04/26/15   Lewayne Bunting, MD  insulin lispro protamine-lispro (HUMALOG 75/25 MIX) (75-25) 100 UNIT/ML SUSP injection Inject 50 Units into the skin daily.    Historical Provider, MD  irbesartan (AVAPRO) 300 MG tablet Take 1 tablet (300 mg total) by mouth at bedtime. 03/15/15   Lewayne Bunting, MD  LORazepam (ATIVAN) 1 MG tablet Take 1 mg by mouth every 8 (eight) hours as needed.      Historical Provider, MD  metFORMIN (GLUCOPHAGE) 500 MG tablet Take 500 mg by mouth 2 (two) times daily with a meal.     Historical Provider, MD  metoprolol succinate (TOPROL-XL) 50 MG 24 hr tablet Take 1 tablet (50 mg total) by mouth daily. 05/13/13   Lewayne Bunting, MD  nitroGLYCERIN (NITROSTAT) 0.4 MG SL tablet Place 1 tablet (0.4 mg total) under the tongue every 5 (five) minutes as needed. 12/14/14   Lewayne Bunting, MD  rosuvastatin (CRESTOR) 40 MG tablet TAKE 1 TABLET DAILY 05/18/14   Lewayne Bunting, MD  Vitamin D, Ergocalciferol, (DRISDOL) 50000 UNITS CAPS Take 50,000 Units by mouth every 7 (seven) days.      Historical Provider, MD   BP 125/101 mmHg  Pulse 151  Temp(Src) 98.6 F (37 C) (Oral)  Resp 11  SpO2 94% Physical Exam  Constitutional: He is oriented to person, place, and time. He appears well-developed and well-nourished. No distress.  HENT:  Head: Normocephalic.  Eyes: Pupils are equal, round, and  reactive to light.  Neck: Normal range of motion.  Cardiovascular:  Irregularly Irregular tachycardic rhythm  Pulmonary/Chest: Effort normal and breath sounds normal. No respiratory distress. He has no wheezes. He has no rales. He exhibits no tenderness.  Abdominal: Soft. He exhibits no distension.  Musculoskeletal: He exhibits edema. He exhibits no tenderness.  Trace bilateral lower extremity edema  Neurological: He is alert and oriented to person, place, and time. No cranial nerve deficit. He exhibits normal muscle tone. Coordination normal.  Skin: Skin is warm. No rash noted. He is not  diaphoretic.  Psychiatric: He has a normal mood and affect. His behavior is normal. Judgment and thought content normal.  Nursing note and vitals reviewed.   ED Course  Procedures (including critical care time) Labs Review Labs Reviewed - No data to display  Imaging Review Dg Chest Portable 1 View  05/02/2015   CLINICAL DATA:  Patient presents with EKG changes today.  EXAM: PORTABLE CHEST 1 VIEW  COMPARISON:  December 31, 2003  FINDINGS: The heart size is enlarged. There is increased pulmonary interstitium bilaterally. There is no focal pneumonia or pleural effusion. No acute abnormalities identified within the visualized bones.  IMPRESSION: Congestive heart failure.   Electronically Signed   By: Sherian Rein M.D.   On: 05/02/2015 15:31   I have personally reviewed and evaluated these images and lab results as part of my medical decision-making.   EKG Interpretation   Date/Time:  Tuesday May 02 2015 14:56:15 EDT Ventricular Rate:  163 PR Interval:    QRS Duration: 74 QT Interval:  300 QTC Calculation: 494 R Axis:   58 Text Interpretation:  Atrial fibrillation with rapid ventricular response  Low voltage, precordial leads Anteroseptal infarct, old Nonspecific T  abnormalities, lateral leads Baseline wander in lead(s) V4 Confirmed by  KNOTT MD, Reuel Boom ([id number]) on 05/02/2015 3:10:53 PM      MDM    Patient presents to the emergency department today with new onset of atrial fibrillation. He has history of PCI in his LAD and RCA. Patient's last ejection fraction 50-55%. He went to urgent care today for dry nonproductive cough and "wheezing" over the past few days. Has had slight worsening SOB with exertion and states he had to take a "fluid pill" a few days ago because he had swelling in his legs. Also endorses orthopnea. Patient denies any chest pain or diaphoresis, nausea or fevers. Well appearing with A-fib and RVR in 160's. No SOB or lightheadedness.   On metroprolol, will give IV  bolus to rate control.  Currently not on anticoagulation. CHADS-VASC suggests anticoagulation. Given new signs and symptoms of CHF, and new onset A-fib, patient will need admission for further management.   Patient's rate uncontrolled with IV metoprolol, but improved after cardizem gtt. Patient admitted to cardiology without further incident.   Final diagnoses:  Atrial fibrillation with RVR (HCC)      Deirdre Peer, MD 05/03/15 6045  Lyndal Pulley, MD 05/04/15 [phone]

## 2015-05-02 NOTE — Telephone Encounter (Signed)
CALLED [facility] FOR [facility] PATIENT BEING TRANSPORT TO HOSPITAL PER Dr Lincoln Brigham OFFICE. [facility] VERBALIZED UNDERSTANDING.

## 2015-05-02 NOTE — Telephone Encounter (Signed)
Wanted to know if Dr. Charm Barges called and spoke w/  Dr. Jens Som about his condition and what is he going to. Please call    Thanks

## 2015-05-02 NOTE — H&P (Signed)
Primary cardiologist: Dr Jens Som  HPI: 59 year old male for evaluation of atrial fibrillation. 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 Myoview in February 2013 showed an ejection fraction of 41%. There was an anterior wall infarct but no ischemia. Last echocardiogram September 2015 was technically difficult but ejection fraction appeared to be 55%. There was grade 2 diastolic dysfunction. Moderate left atrial enlargement. Small pericardial effusion. Patient states that for the past 2 weeks he has had some dyspnea on exertion, orthopnea and pedal edema. He took Lasix with some improvement but symptoms have now persisted. He denies chest pain or palpitations. He presented and was noted to be in atrial fibrillation and cardiology was asked to evaluate.   (Not in a hospital admission)  Allergies  Allergen Reactions  . Codeine     Liquid for caused REACTION: Nausea/vomiting  . Clopidogrel Bisulfate Itching and Rash    Past Medical History  Diagnosis Date  . CAD (coronary artery disease)   . GERD (gastroesophageal reflux disease)   . DM (diabetes mellitus) (HCC)   . HLD (hyperlipidemia)   . HTN (hypertension)   . Sleep apnea   . Obesity     Past Surgical History  Procedure Laterality Date  . Coronary stent placement      eluting stent placement in the culpric lesion of the LAD  . Cardiac catheterization      with coronary angiography     Social History   Social History  . Marital Status: Married    Spouse Name: N/A  . Number of Children: N/A  . Years of Education: N/A   Occupational History  . Not on file.   Social History Main Topics  . Smoking status: Former Games developer  . Smokeless tobacco: Not on file  . Alcohol Use: No  . Drug Use: No  . Sexual Activity: Not on file   Other Topics Concern  . Not on file   Social History Narrative    Family History  Problem Relation Age of Onset  . Heart attack Mother   .  Heart attack Father     ROS:  no fevers or chills, productive cough, hemoptysis, dysphasia, odynophagia, melena, hematochezia, dysuria, hematuria, rash, seizure activity, orthopnea, PND, claudication. Remaining systems are negative.  Physical Exam:   Blood pressure 119/90, pulse 110, temperature 98.6 F (37 C), temperature source Oral, resp. rate 24, SpO2 92 %.  General:  Well developed/obese in NAD Skin warm/dry Patient not depressed No peripheral clubbing Back-normal HEENT-normal/normal eyelids Neck supple/normal carotid upstroke bilaterally; no bruits; no JVD; no thyromegaly chest - CTA/ normal expansion CV - irregular/normal S1 and S2; no murmurs, rubs or gallops;  PMI nondisplaced Abdomen -NT/ND, no HSM, no mass, + bowel sounds, no bruit 2+ femoral pulses, no bruits Ext-1+ edema, no chords, 2+ DP Neuro-grossly nonfocal  ECG atrial fibrillation with rapid ventricular response, cannot rule out prior septal infarct, nonspecific ST changes.  Results for orders placed or performed during the hospital encounter of 05/02/15 (from the past 48 hour(s))  CBC with Differential     Status: None   Collection Time: 05/02/15  4:49 PM  Result Value Ref Range   WBC 8.2 4.0 - 10.5 K/uL   RBC 5.23 4.22 - 5.81 MIL/uL   Hemoglobin 15.4 13.0 - 17.0 g/dL   HCT 16.1 09.6 - 04.5 %   MCV 86.6 78.0 - 100.0 fL   MCH 29.4 26.0 - 34.0 pg  MCHC 34.0 30.0 - 36.0 g/dL   RDW 16.1 09.6 - 04.5 %   Platelets 261 150 - 400 K/uL   Neutrophils Relative % 81 %   Neutro Abs 6.6 1.7 - 7.7 K/uL   Lymphocytes Relative 10 %   Lymphs Abs 0.8 0.7 - 4.0 K/uL   Monocytes Relative 8 %   Monocytes Absolute 0.6 0.1 - 1.0 K/uL   Eosinophils Relative 1 %   Eosinophils Absolute 0.1 0.0 - 0.7 K/uL   Basophils Relative 0 %   Basophils Absolute 0.0 0.0 - 0.1 K/uL    Dg Chest Portable 1 View  05/02/2015   CLINICAL DATA:  Patient presents with EKG changes today.  EXAM: PORTABLE CHEST 1 VIEW  COMPARISON:  December 31, 2003   FINDINGS: The heart size is enlarged. There is increased pulmonary interstitium bilaterally. There is no focal pneumonia or pleural effusion. No acute abnormalities identified within the visualized bones.  IMPRESSION: Congestive heart failure.   Electronically Signed   By: Sherian Rein M.D.   On: 05/02/2015 15:31    Assessment/Plan 1 new-onset atrial fibrillation-the patient presents with congestive heart failure symptoms likely related to new onset atrial fibrillation. His rate is elevated. We'll continue Toprol for rate control and add IV Cardizem. Check TSH and repeat echocardiogram. Embolic risk factors include cardiomyopathy/congestive heart failure-diabetes mellitus, hypertension, coronary artery disease CHADSvasc-4. Add apixaban 5 mg BID. If atrial fibrillation is difficult to control or symptoms persist despite rate control proceed with transesophageal echocardiogram guided cardioversion. Otherwise could proceed with cardioversion after 3 weeks fully anticoagulated. 2 acute combined systolic/diastolic congestive heart failure-likely related to new onset atrial fibrillation. He is volume overloaded. Diurese with Lasix 40 mg IV daily. Follow renal function. 3 coronary artery disease-given the addition of anticoagulation will discontinue aspirin. Continue statin. 4 diabetes mellitus-continue preadmission medications and follow CBGs. 5 hypertension-given addition of Cardizem I will decrease Avapro to 150 mg daily. Follow blood pressure and adjust regimen as needed. 6 hyperlipidemia-continue statin.  Olga Millers MD 05/02/2015, 5:18 PM

## 2015-05-02 NOTE — ED Notes (Signed)
Pt. Presents with complaint of EKG changes. Pt. Presented to [facility] this morning for a cough. UCC noticed pt. To be in Afib with RVR. HR between 140-170. Pt. Is asymptomatic. States cough has resolved. EMS reports lung sounds clears. Pt. Denies hx of afib. Hx of MI in 2005 with stents.

## 2015-05-03 ENCOUNTER — Ambulatory Visit (HOSPITAL_COMMUNITY): Payer: Medicare Other

## 2015-05-03 DIAGNOSIS — I4891 Unspecified atrial fibrillation: Secondary | ICD-10-CM

## 2015-05-03 DIAGNOSIS — I480 Paroxysmal atrial fibrillation: Secondary | ICD-10-CM

## 2015-05-03 LAB — BASIC METABOLIC PANEL
Anion gap: 13 (ref 5–15)
BUN: 12 mg/dL (ref 6–20)
CHLORIDE: 93 mmol/L — AB (ref 101–111)
CO2: 26 mmol/L (ref 22–32)
Calcium: 8.8 mg/dL — ABNORMAL LOW (ref 8.9–10.3)
Creatinine, Ser: 1.04 mg/dL (ref 0.61–1.24)
GFR calc Af Amer: >60 mL/min (ref >60)
GFR calc non Af Amer: >60 mL/min (ref >60)
GLUCOSE: 305 mg/dL — AB (ref 65–99)
POTASSIUM: 4 mmol/L (ref 3.5–5.1)
Sodium: 132 mmol/L — ABNORMAL LOW (ref 135–145)

## 2015-05-03 LAB — CBC
HEMATOCRIT: 42.9 % (ref 39.0–52.0)
Hemoglobin: 15.2 g/dL (ref 13.0–17.0)
MCH: 30.6 pg (ref 26.0–34.0)
MCHC: 35.4 g/dL (ref 30.0–36.0)
MCV: 86.3 fL (ref 78.0–100.0)
Platelets: 253 10*3/uL (ref 150–400)
RBC: 4.97 MIL/uL (ref 4.22–5.81)
RDW: 12.9 % (ref 11.5–15.5)
WBC: 8.6 10*3/uL (ref 4.0–10.5)

## 2015-05-03 LAB — GLUCOSE, CAPILLARY
GLUCOSE-CAPILLARY: 191 mg/dL — AB (ref 65–99)
GLUCOSE-CAPILLARY: 261 mg/dL — AB (ref 65–99)
Glucose-Capillary: 323 mg/dL — ABNORMAL HIGH (ref 65–99)
Glucose-Capillary: 207 mg/dL — ABNORMAL HIGH (ref 65–99)

## 2015-05-03 MED ORDER — PERFLUTREN LIPID MICROSPHERE
1.0000 mL | INTRAVENOUS | Status: AC | PRN
  Administered 2015-05-03: 5 mL via INTRAVENOUS
  Filled 2015-05-03: qty 10

## 2015-05-03 MED ORDER — OFF THE BEAT BOOK
Freq: Once | Status: AC
  Administered 2015-05-03: 15:00:00
  Filled 2015-05-03: qty 1

## 2015-05-03 MED ORDER — METOPROLOL SUCCINATE ER 25 MG PO TB24
75.0000 mg | ORAL_TABLET | Freq: Every day | ORAL | Status: DC
  Administered 2015-05-03 – 2015-05-05 (×3): 75 mg via ORAL
  Filled 2015-05-03 (×6): qty 1

## 2015-05-03 NOTE — Progress Notes (Signed)
  Echocardiogram 2D Echocardiogram has been performed.  Arvil Chaco 05/03/2015, 11:10 AM

## 2015-05-03 NOTE — Progress Notes (Signed)
UR Completed Jazara Swiney Graves-Bigelow, RN,BSN [phone]  

## 2015-05-03 NOTE — Progress Notes (Signed)
    Subjective:  Denies CP or dyspnea   Objective:  Filed Vitals:   05/02/15 2349 05/03/15 0002 05/03/15 0404 05/03/15 0454  BP:  125/68 138/91   Pulse:  123 109   Temp: 98.3 F (36.8 C)  98.5 F (36.9 C)   TempSrc: Oral  Oral   Resp:  16 25   Height:      Weight:    122.199 kg (269 lb 6.4 oz)  SpO2:  95% 90%     Intake/Output from previous day:  Intake/Output Summary (Last 24 hours) at 05/03/15 0845 Last data filed at 05/03/15 0454  Gross per 24 hour  Intake    480 ml  Output   3225 ml  Net  -2745 ml    Physical Exam: Physical exam: Well-developed well-nourished in no acute distress.  Skin is warm and dry.  HEENT is normal.  Neck is supple.  Chest is clear to auscultation with normal expansion.  Cardiovascular exam is irregular and tachycardic Abdominal exam nontender or distended. No masses palpated. Extremities show trace edema. neuro grossly intact    Lab Results: Basic Metabolic Panel:  Recent Labs  16/10/96 1649 05/03/15 0548  NA 137 132*  K 4.6 4.0  CL 102 93*  CO2 24 26  GLUCOSE 255* 305*  BUN 12 12  CREATININE 1.00 1.04  CALCIUM 9.4 8.8*  MG 1.4*  --    CBC:  Recent Labs  05/02/15 1649 05/03/15 0548  WBC 8.2 8.6  NEUTROABS 6.6  --   HGB 15.4 15.2  HCT 45.3 42.9  MCV 86.6 86.3  PLT 261 253     Assessment/Plan:  1 new-onset atrial fibrillation-the patient remains in atrial fibrillation and his rate remains elevated. We'll continue IV Cardizem. Increase toprol to 75 mg daily. Repeat echocardiogram. TSH normal. Embolic risk factors include cardiomyopathy/congestive heart failure-diabetes mellitus, hypertension, coronary artery disease CHADSvasc-4. Continue apixaban 5 mg BID. If atrial fibrillation persists, will plan transesophageal echocardiogram guided cardioversion on Friday after 5 doses of apixaban. If he does not hold sinus would likely need antiarrhythmic. 2 acute combined systolic/diastolic congestive heart failure-likely  related to new onset atrial fibrillation. He remains volume overloaded. Continue Lasix 40 mg IV daily. Follow renal function. 3 coronary artery disease-No ASA given need for apixaban. Continue statin. 4 diabetes mellitus-continue preadmission medications and follow CBGs. 5 hypertension-DC avapro given addition of cardizem.  Olga Millers 05/03/2015, 8:45 AM

## 2015-05-03 NOTE — Care Management Note (Addendum)
Case Management Note  Patient Details  Name: Lawrence Delgado MRN: [id number] Date of Birth: 1956-04-12  Subjective/Objective: Pt admitted for new onset Afib. Initiated on IV Cardizem gtt. Pt also admitted for CHF symptoms and placed on IV lasix daily.                   Action/Plan: CM will continue to monitor for disposition needs.    Expected Discharge Date:                  Expected Discharge Plan:  Home/Self Care  In-House Referral:     Discharge planning Services  CM Consult  Post Acute Care Choice:    Choice offered to:     DME Arranged:    DME Agency:     HH Arranged:    HH Agency:     Status of Service:  In process, will continue to follow  Medicare Important Message Given:    Date Medicare IM Given:    Medicare IM give by:    Date Additional Medicare IM Given:    Additional Medicare Important Message give by:     If discussed at Long Length of Stay Meetings, dates discussed:    Additional Comments: Per rep at [facility]:   Eliquis  BID: Covered, auth required [phone], patient will pay 15% of total cost of medication at retail pharmacy   Patient can use: [facility], [facility]     [address] RN,BSN [phone] CM did benefits check for Eliquis. Will make pt aware of cost once completed. No further needs from CM at this time.   Gala Lewandowsky, RN 05/03/2015, 12:04 PM

## 2015-05-03 NOTE — Progress Notes (Addendum)
Patients BP=92/53.  Patient asymptomatic.  Dr. Jens Som paged he stated ok to keep IV cardizem going at /hr as long as SBP is at least 90, titrate if needed.

## 2015-05-04 DIAGNOSIS — I5021 Acute systolic (congestive) heart failure: Secondary | ICD-10-CM

## 2015-05-04 LAB — GLUCOSE, CAPILLARY
GLUCOSE-CAPILLARY: 279 mg/dL — AB (ref 65–99)
GLUCOSE-CAPILLARY: 300 mg/dL — AB (ref 65–99)
Glucose-Capillary: 298 mg/dL — ABNORMAL HIGH (ref 65–99)
Glucose-Capillary: 317 mg/dL — ABNORMAL HIGH (ref 65–99)

## 2015-05-04 LAB — BASIC METABOLIC PANEL
ANION GAP: 10 (ref 5–15)
BUN: 14 mg/dL (ref 6–20)
CALCIUM: 8.9 mg/dL (ref 8.9–10.3)
CO2: 31 mmol/L (ref 22–32)
Chloride: 92 mmol/L — ABNORMAL LOW (ref 101–111)
Creatinine, Ser: 1 mg/dL (ref 0.61–1.24)
GLUCOSE: 236 mg/dL — AB (ref 65–99)
POTASSIUM: 3.6 mmol/L (ref 3.5–5.1)
Sodium: 133 mmol/L — ABNORMAL LOW (ref 135–145)

## 2015-05-04 LAB — CBC
HEMATOCRIT: 43.9 % (ref 39.0–52.0)
HEMOGLOBIN: 15.1 g/dL (ref 13.0–17.0)
MCH: 30 pg (ref 26.0–34.0)
MCHC: 34.4 g/dL (ref 30.0–36.0)
MCV: 87.3 fL (ref 78.0–100.0)
Platelets: 253 10*3/uL (ref 150–400)
RBC: 5.03 MIL/uL (ref 4.22–5.81)
RDW: 12.7 % (ref 11.5–15.5)
WBC: 8.6 10*3/uL (ref 4.0–10.5)

## 2015-05-04 LAB — HEMOGLOBIN A1C
HEMOGLOBIN A1C: 10.2 % — AB (ref 4.8–5.6)
Mean Plasma Glucose: 246 mg/dL

## 2015-05-04 MED ORDER — INSULIN ASPART PROT & ASPART (70-30 MIX) 100 UNIT/ML ~~LOC~~ SUSP: 50.0000 [IU] | Freq: Every day | SUBCUTANEOUS | Status: DC

## 2015-05-04 MED ORDER — SODIUM CHLORIDE 0.9 % IV SOLN
INTRAVENOUS | Status: DC
  Administered 2015-05-04: 21:00:00 via INTRAVENOUS

## 2015-05-04 NOTE — Progress Notes (Signed)
Patient Name: Lawrence Delgado Date of Encounter: 05/04/2015   Principal Problem:   Atrial fibrillation [facility]) Active Problems:   Acute systolic CHF (congestive heart failure) (HCC)   Type II diabetes mellitus (HCC)   OBESITY   Essential hypertension   Coronary atherosclerosis   Hyperlipidemia   GERD   Sleep apnea    SUBJECTIVE  Feels like breathing is improving.  No chest pain.  Remains in AF but denies palpitations.  CURRENT MEDS . antiseptic oral rinse  7 mL Mouth Rinse BID  . apixaban  5 mg Oral BID  . furosemide  40 mg Intravenous Daily  . Influenza vac split quadrivalent PF  0.5 mL Intramuscular Tomorrow-1000  . insulin aspart  0-15 Units Subcutaneous TID WC  . insulin aspart protamine- aspart  45 Units Subcutaneous Q breakfast  . metoprolol succinate  75 mg Oral Daily  . rosuvastatin  40 mg Oral QHS    OBJECTIVE  Filed Vitals:   05/04/15 0544 05/04/15 0642 05/04/15 0730 05/04/15 1011  BP: 104/80   120/95  Pulse: 38 92  110  Temp:   98.2 F (36.8 C)   TempSrc:   Oral   Resp: 15 27    Height:      Weight:      SpO2: 92% 92%      Intake/Output Summary (Last 24 hours) at 05/04/15 1028 Last data filed at 05/04/15 0431  Gross per 24 hour  Intake    150 ml  Output    300 ml  Net   -150 ml   Filed Weights   05/02/15 2001 05/03/15 0454 05/04/15 0430  Weight: 271 lb 14.4 oz (123.333 kg) 269 lb 6.4 oz (122.199 kg) 270 lb (122.471 kg)    PHYSICAL EXAM  General: Pleasant, NAD. Neuro: Alert and oriented X 3. Moves all extremities spontaneously. Psych: Normal affect. HEENT:  Normal  Neck: Supple without bruits.  JVP ! 12 cm - difficult to gauge 2/2 girth. Lungs:  Resp regular and unlabored, diminished breath sounds bilat with scattered rhonchi, basilar rales. Heart: IR, IR no s3, s4, or murmurs. Abdomen: Semi-firm, obese, non-tender, BS + x 4.  Extremities: No clubbing, cyanosis or edema. DP/PT/Radials 1+ and equal bilaterally.  Accessory Clinical  Findings  CBC  Recent Labs  05/02/15 1649 05/03/15 0548 05/04/15 0438  WBC 8.2 8.6 8.6  NEUTROABS 6.6  --   --   HGB 15.4 15.2 15.1  HCT 45.3 42.9 43.9  MCV 86.6 86.3 87.3  PLT 261 253 253   Basic Metabolic Panel  Recent Labs  05/02/15 1649 05/03/15 0548 05/04/15 0438  NA 137 132* 133*  K 4.6 4.0 3.6  CL 102 93* 92*  CO2 GLUCOSE 255* 305* 236*  BUN CREATININE 1.00 1.04 1.00  CALCIUM 9.4 8.8* 8.9  MG 1.4*  --   --    Hemoglobin A1C  Recent Labs  05/02/15 2051  HGBA1C 10.2*   Thyroid Function Tests  Recent Labs  05/02/15 2051  TSH 0.672    TELE  Afib, 1-teens to 130's.  2D Echocardiogram 10.5.2016  Study Conclusions  - Procedure narrative: Transthoracic echocardiography. Image quality was adequate. The study was technically difficult, as a result of poor sound wave transmission and body habitus. Intravenous contrast (Definity) was administered. - Left ventricle: The cavity size was normal. Wall thickness was normal. Systolic function was normal. The estimated ejection fraction was 20%. Diffuse hypokinesis. The study is not  technically sufficient to allow evaluation of LV diastolic function. - Left atrium: The atrium was normal in size. - Right atrium: The atrium was mildly dilated. _____________   Radiology/Studies  Dg Chest Portable 1 View  05/02/2015   CLINICAL DATA:  Patient presents with EKG changes today.  EXAM: PORTABLE CHEST 1 VIEW  COMPARISON:  December 31, 2003  FINDINGS: The heart size is enlarged. There is increased pulmonary interstitium bilaterally. There is no focal pneumonia or pleural effusion. No acute abnormalities identified within the visualized bones.  IMPRESSION: Congestive heart failure.   Electronically Signed   By: Sherian Rein M.D.   On: 05/02/2015 15:31    ASSESSMENT AND PLAN  1.  AFib RVR: Pt presented with a several wk h/o progressive dyspnea and lower ext swelling and was found to be  in AF with RVR.  He remains on oral BB and IV dilt with rates in the 1-teens.  Breathing has improved with diuresis though he still has evidence of volume overload.  He has received 4 doses of eliquis and will have his 5th tonight (CHA2DS2VASc =4).  We will plan on TEE/DCCV in the AM.  After careful review of history and examination, the risks and benefits of transesophageal echocardiogram have been explained including risks of esophageal damage, perforation (1:10,000 risk), bleeding, pharyngeal hematoma as well as other potential complications associated with conscious sedation including aspiration, arrhythmia, respiratory failure and death. Alternatives to treatment were discussed, questions were answered. Patient is willing to proceed.   2.  Acute Systolic CHF:  EF 20% with diff HK by echo on 10/5.  EF was normal by echo in 03/2014.  Suspect new LV dysfxn is most likely tachycardia mediated.  He does have a h/o CAD however troponin (only one evaluated) was normal in the ED.  He is minus 2.7L since admission and is down 1 lb, though he reports a wt of 279 @ home (currently 270).  He continues to have evidence of volume overload on exam.  Cont IV lasix and toprol xl therapy.  He has been prone to relative hypotension, thus I'm not sure that he'll tolerate an ACEI right now, though once we're able to d/c IV dilt, we should be able to add.  As above, plan TEE/DCCV in AM with probable outpt f/u nuc study and echo to r/o ischemia and re-eval EF in sinus rhythm - provided that he is able to hold sinus.    3.  Essential HTN:  Stable to low on bb and dilt.  4.  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  Non-ischemic nuc in 08/2011.  No c/p on presentation however EF now down - likely tachy-mediated.  Plan outpt mv.  Cont bb/statin.  No asa as he is now on eliquis.  5.  HL:  Cont crestor.  Last LDL in 10/14 was 63.  Needs f/u lipids/lft's if not checked since then.  6.  Type II DM:  Glucoses  running 200-300.  Increase 70/30 to 50 daily.  Appreciate DM coordinator recs.  7.  Morbid Obesity:  Would really benefit from outpt exercise and nutritional counseling.  Nicolasa Ducking, NP 05/04/2015 10:32 AM    Signed, Nicolasa Ducking NP As above, patient seen and examined. Patient denies dyspnea or chest pain. He remains in atrial fibrillation and his rate has improved. Continue Cardizem and Toprol. Continue apixaban. Plan TEE guided cardioversion tomorrow. If he does not hold sinus rhythm we would need to add  an anti-rhythmic likely tikosyn. Note his LV function is reduced compared to previous. I think this is likely tachycardia mediated. Hopefully this will improve once we reestablish sinus rhythm. He will need repeat echocardiogram in 3 months. We also plan nuclear study to screen for ischemia. One sinus is reestablished I will discontinue Cardizem and resume Avapro. Continue gentle diuresis and follow renal function. Olga Millers

## 2015-05-04 NOTE — Progress Notes (Signed)
Paged Cards Fellow for question on Eliquis administration. Pt was started on it this Tuesday

## 2015-05-04 NOTE — Progress Notes (Signed)
Inpatient Diabetes Program Recommendations  AACE/ADA: New Consensus Statement on Inpatient Glycemic Control (2015)  Target Ranges:  Prepandial:   less than 140 mg/dL      Peak postprandial:   less than 180 mg/dL (1-2 hours)      Critically ill patients:  140 - 180 mg/dL   Review of Glycemic Control  Diabetes history: DM 2 Outpatient Diabetes medications: 75/25 45 units Daily, Metformin 1,000 mg QAM, 500 mg QPM Current orders for Inpatient glycemic control: 70/30 45 units Daily, Novolog moderate TID  Inpatient Diabetes Program Recommendations:  Insulin - Basal: Glucose trending into the 200's. Please consider increasing 70/30 to 50 units Daily.  Thanks,  Christena Deem RN, MSN, [facility] Inpatient Diabetes Coordinator Team Pager [phone] (8a-5p)

## 2015-05-05 ENCOUNTER — Inpatient Hospital Stay (HOSPITAL_COMMUNITY): Payer: Medicare Other

## 2015-05-05 ENCOUNTER — Encounter (HOSPITAL_COMMUNITY): Payer: Self-pay | Admitting: Certified Registered Nurse Anesthetist

## 2015-05-05 ENCOUNTER — Inpatient Hospital Stay (HOSPITAL_COMMUNITY): Payer: Medicare Other | Admitting: Anesthesiology

## 2015-05-05 ENCOUNTER — Ambulatory Visit (HOSPITAL_COMMUNITY): Payer: Medicare Other

## 2015-05-05 ENCOUNTER — Encounter (HOSPITAL_COMMUNITY)
Admission: EM | Disposition: A | Discharge status: Home / Self Care | Payer: Self-pay | Source: Home / Self Care | Attending: Cardiology

## 2015-05-05 DIAGNOSIS — I4891 Unspecified atrial fibrillation: Secondary | ICD-10-CM

## 2015-05-05 DIAGNOSIS — I10 Essential (primary) hypertension: Secondary | ICD-10-CM

## 2015-05-05 DIAGNOSIS — I5021 Acute systolic (congestive) heart failure: Secondary | ICD-10-CM

## 2015-05-05 DIAGNOSIS — I340 Nonrheumatic mitral (valve) insufficiency: Secondary | ICD-10-CM

## 2015-05-05 HISTORY — PX: TEE WITHOUT CARDIOVERSION: SHX5443

## 2015-05-05 HISTORY — PX: CARDIOVERSION: SHX1299

## 2015-05-05 LAB — BASIC METABOLIC PANEL
ANION GAP: 11 (ref 5–15)
BUN: 16 mg/dL (ref 6–20)
CALCIUM: 9 mg/dL (ref 8.9–10.3)
CO2: 32 mmol/L (ref 22–32)
Chloride: 92 mmol/L — ABNORMAL LOW (ref 101–111)
Creatinine, Ser: 1.06 mg/dL (ref 0.61–1.24)
GFR calc Af Amer: >60 mL/min (ref >60)
GLUCOSE: 265 mg/dL — AB (ref 65–99)
POTASSIUM: 4 mmol/L (ref 3.5–5.1)
SODIUM: 135 mmol/L (ref 135–145)

## 2015-05-05 LAB — GLUCOSE, CAPILLARY
GLUCOSE-CAPILLARY: 261 mg/dL — AB (ref 65–99)
GLUCOSE-CAPILLARY: 330 mg/dL — AB (ref 65–99)
Glucose-Capillary: 258 mg/dL — ABNORMAL HIGH (ref 65–99)
Glucose-Capillary: 293 mg/dL — ABNORMAL HIGH (ref 65–99)
Glucose-Capillary: 331 mg/dL — ABNORMAL HIGH (ref 65–99)
Glucose-Capillary: 277 mg/dL — ABNORMAL HIGH (ref 65–99)

## 2015-05-05 SURGERY — ECHOCARDIOGRAM, TRANSESOPHAGEAL
Anesthesia: Monitor Anesthesia Care

## 2015-05-05 MED ORDER — INSULIN ASPART PROT & ASPART (70-30 MIX) 100 UNIT/ML ~~LOC~~ SUSP: 54.0000 [IU] | Freq: Every day | SUBCUTANEOUS | Status: DC

## 2015-05-05 MED ORDER — PROPOFOL 10 MG/ML IV BOLUS
INTRAVENOUS | Status: DC | PRN
  Administered 2015-05-05: 20 mg via INTRAVENOUS
  Administered 2015-05-05: 30 mg via INTRAVENOUS
  Administered 2015-05-05 (×2): 20 mg via INTRAVENOUS
  Administered 2015-05-05 (×3): 10 mg via INTRAVENOUS

## 2015-05-05 MED ORDER — LIDOCAINE HCL (CARDIAC) 20 MG/ML IV SOLN
INTRAVENOUS | Status: DC | PRN
  Administered 2015-05-05: 100 mg via INTRAVENOUS

## 2015-05-05 MED ORDER — METOPROLOL SUCCINATE ER 100 MG PO TB24
100.0000 mg | ORAL_TABLET | Freq: Every day | ORAL | Status: DC
  Administered 2015-05-06 – 2015-05-07 (×2): 100 mg via ORAL
  Filled 2015-05-05 (×2): qty 1

## 2015-05-05 MED ORDER — BUTAMBEN-TETRACAINE-BENZOCAINE 2-2-14 % EX AERO
INHALATION_SPRAY | CUTANEOUS | Status: DC | PRN
  Administered 2015-05-05: 2 via TOPICAL

## 2015-05-05 NOTE — Progress Notes (Signed)
Pt oxygen sats decreasing to 74% when oxygen removed to ambulate to bathroom. Replaced 3L sats up to 92%. Educated pt oxygen was not to be removed. Ward Givens NP notified. New orders given. Emelda Brothers RN

## 2015-05-05 NOTE — Progress Notes (Signed)
Inpatient Diabetes Program Recommendations  AACE/ADA: New Consensus Statement on Inpatient Glycemic Control (2015)  Target Ranges:  Prepandial:   less than 140 mg/dL      Peak postprandial:   less than 180 mg/dL (1-2 hours)      Critically ill patients:  140 - 180 mg/dL   Results for Lawrence Delgado, Lawrence Delgado (MRN [id number]) as of 05/05/2015 07:55  Ref. Range 05/04/2015 07:41 05/04/2015 11:27 05/04/2015 16:44 05/04/2015 20:45 05/05/2015 07:34  Glucose-Capillary Latest Ref Range: 65-99 mg/dL 409 (H) 811 (H) 914 (H) 300 (H) 261 (H)   Review of Glycemic Control  Diabetes history: DM 2 Outpatient Diabetes medications: 75/25 45 units Daily, Metformin 1,000 mg QAM, 500 mg QPM Current orders for Inpatient glycemic control: 70/30 50 units Daily, Novolog moderate TID  Inpatient Diabetes Program Recommendations:  Insulin - Basal: Patient to have forst dose of 70/30 50 units this am. May have to increase to 55 units. Correction (SSI): Glucose still in the 200-300 range. Please consider increasing correction to Novolog Resistant Q4hrs while NPO.  Thanks,  Christena Deem RN, MSN, [facility] Inpatient Diabetes Coordinator Team Pager [phone] (8a-5p)

## 2015-05-05 NOTE — Transfer of Care (Signed)
Immediate Anesthesia Transfer of Care Note  Patient: Lawrence Delgado  Procedure(s) Performed: Procedure(s): TRANSESOPHAGEAL ECHOCARDIOGRAM (TEE) (N/A) CARDIOVERSION (N/A)  Patient Location: Endoscopy Unit  Anesthesia Type:MAC  Level of Consciousness: awake, alert  and oriented  Airway & Oxygen Therapy: Patient Spontanous Breathing and Patient connected to nasal cannula oxygen  Post-op Assessment: Report given to RN  Post vital signs: Reviewed and stable  Last Vitals:  Filed Vitals:   05/05/15 1242  BP: 120/82  Pulse: 110  Temp: 36.6 C  Resp: 17    Complications: No apparent anesthesia complications

## 2015-05-05 NOTE — Progress Notes (Signed)
Patient Name: Lawrence Delgado Date of Encounter: 05/05/2015   Principal Problem:   Atrial fibrillation (HCC) Active Problems:   Acute systolic CHF (congestive heart failure) (HCC)   Type II diabetes mellitus (HCC)   OBESITY   Essential hypertension   Coronary atherosclerosis   Hyperlipidemia   GERD   Sleep apnea    SUBJECTIVE  Breathing continues to improve.  Has questions about TEE and DCCV - all questions answered.  CURRENT MEDS . antiseptic oral rinse  7 mL Mouth Rinse BID  . apixaban  5 mg Oral BID  . furosemide  40 mg Intravenous Daily  . Influenza vac split quadrivalent PF  0.5 mL Intramuscular Tomorrow-1000  . insulin aspart  0-15 Units Subcutaneous TID WC  . insulin aspart protamine- aspart  50 Units Subcutaneous Q breakfast  . metoprolol succinate  75 mg Oral Daily  . rosuvastatin  40 mg Oral QHS    OBJECTIVE  Filed Vitals:   05/05/15 0020 05/05/15 0444 05/05/15 0500 05/05/15 0740  BP: 112/80 109/80 107/74   Pulse: 90 88 88   Temp: 98.4 F (36.9 C)  98.7 F (37.1 C) 98.6 F (37 C)  TempSrc: Oral  Oral Oral  Resp: Height:      Weight:   268 lb 11.2 oz (121.882 kg)   SpO2: 91% 90% 93%     Intake/Output Summary (Last 24 hours) at 05/05/15 0816 Last data filed at 05/05/15 0740  Gross per 24 hour  Intake      0 ml  Output   1750 ml  Net  -1750 ml   Filed Weights   05/03/15 0454 05/04/15 0430 05/05/15 0500  Weight: 269 lb 6.4 oz (122.199 kg) 270 lb (122.471 kg) 268 lb 11.2 oz (121.882 kg)    PHYSICAL EXAM   General: Pleasant, NAD. Neuro: Alert and oriented X 3. Moves all extremities spontaneously. Psych: Normal affect. HEENT: Normal Neck: Supple without bruits. JVP ~ 10-12 cm - difficult to gauge 2/2 girth. Lungs: Resp regular and unlabored, diminished breath sounds bilat though clearer than yesterday. Heart: IR, IR no s3, s4, or murmurs. Abdomen: Softer than yesterday, obese, non-tender, BS + x 4.  Extremities: No  clubbing, cyanosis or edema. DP/PT/Radials 1+ and equal bilaterally.  Accessory Clinical Findings  CBC  Recent Labs  05/02/15 1649 05/03/15 0548 05/04/15 0438  WBC 8.2 8.6 8.6  NEUTROABS 6.6  --   --   HGB 15.4 15.2 15.1  HCT 45.3 42.9 43.9  MCV 86.6 86.3 87.3  PLT 261 253 253   Basic Metabolic Panel  Recent Labs  05/02/15 1649  05/04/15 0438 05/05/15 0325  NA 137  < > 133* 135  K 4.6  < > 3.6 4.0  CL 102  < > 92* 92*  CO2 24  < > 31 32  GLUCOSE 255*  < > 236* 265*  BUN 12  < > 14 16  CREATININE 1.00  < > 1.00 1.06  CALCIUM 9.4  < > 8.9 9.0  MG 1.4*  --   --   --   < > = values in this interval not displayed. Hemoglobin A1C  Recent Labs  05/02/15 2051  HGBA1C 10.2*   Thyroid Function Tests  Recent Labs  05/02/15 2051  TSH 0.672   TELE  Afib, 80's to 1-teens.  Radiology/Studies  Dg Chest Portable 1 View  05/02/2015   CLINICAL DATA:  Patient presents with EKG changes today.  EXAM:  PORTABLE CHEST 1 VIEW  COMPARISON:  December 31, 2003  FINDINGS: The heart size is enlarged. There is increased pulmonary interstitium bilaterally. There is no focal pneumonia or pleural effusion. No acute abnormalities identified within the visualized bones.  IMPRESSION: Congestive heart failure.   Electronically Signed   By: Sherian Rein M.D.   On: 05/02/2015 15:31   2D Echocardiogram 10.5.2016  Study Conclusions  - Procedure narrative: Transthoracic echocardiography. Image quality was adequate. The study was technically difficult, as a result of poor sound wave transmission and body habitus. Intravenous contrast (Definity) was administered. - Left ventricle: The cavity size was normal. Wall thickness was normal. Systolic function was normal. The estimated ejection fraction was 20%. Diffuse hypokinesis. The study is not technically sufficient to allow evaluation of LV diastolic function. - Left atrium: The atrium was normal in size. - Right atrium: The atrium  was mildly dilated.  ASSESSMENT AND PLAN  1. AFib RVR: Pt presented with a several wk h/o progressive dyspnea and lower ext swelling and was found to be in AF with RVR. He remains on oral BB and IV dilt with rates now in the 80's to 1-teens. Breathing has improved with diuresis though he still has evidence of volume overload. He has now received 5 doses of eliquis (CHA2DS2VASc =4) and is scheduled for TEE/DCCV today @ 1pm.  We will plan on TEE/DCCV in the AM. He had questions about TEE/DCCV again today and after careful review of history and examination, the risks and benefits of transesophageal echocardiogram have been explained including risks of esophageal damage, perforation (1:10,000 risk), bleeding, pharyngeal hematoma as well as other potential complications associated with conscious sedation including aspiration, arrhythmia, respiratory failure and death. Alternatives to treatment were discussed, questions were answered. Patient is willing to proceed.  Post-dccv, provided it is successful, plan to titrate oral BB if possible, d/c CCB given CM, and ambulate.  Possible d/c in AM depending on volume status and symptoms.  2. Acute Systolic CHF: EF 16% with diff HK by echo on 10/5. EF was normal by echo in 03/2014. Suspect new LV dysfxn is most likely tachycardia mediated. He does have a h/o CAD however troponin (only one evaluated) was normal in the ED. He is minus 4.6L since admission and is down 3 lbs, though he reports a wt of 279 @ home (currently 268). He continues to have evidence of volume overload on exam. Renal fxn stable.  Cont IV lasix and toprol xl therapy. He has been prone to relativ hypotension, thus I'm not sure that he'll tolerate an ACEI right now, though once we're able to d/c IV dilt, we may be able to add. As above, plan TEE/DCCV today with probable outpt f/u, nuc study, and echo to r/o ischemia and re-eval EF in sinus rhythm - provided that he is able to hold sinus.    3. Essential HTN: Stable to low on bb and dilt.  Look to add acei once off IV dilt if bp allows.  4. CAD: S/p prior anterior infarct with PCI of his LAD and RCA. Non-ischemic nuc in 08/2011. No c/p on presentation however EF now down - likely tachy-mediated. Plan outpt mv. Cont bb/statin. No asa as he is now on eliquis.  5. HL: Cont crestor. Last LDL in 10/14 was 63. Needs f/u lipids/lft's if not checked since then.  6. Type II DM: Glucoses still running 200-300 despite increasing 70/30 to 50 u.  He's NPO this AM.  Will increase to 54 u  for tomorrow.  7. Morbid Obesity: Would really benefit from outpt exercise and nutritional counseling.  Signed, Nicolasa Ducking NP As above; patient seen and examined. He remains in atrial fibrillation. Plan to continue Cardizem, Toprol and apixaban. Proceed with TEE guided cardioversion. If he does not hold sinus rhythm would likely need an antiarrhythmics such as tikosyn. Once he is in sinus rhythm with plan to discontinue Cardizem and titrate beta blocker and resume ARB. Patient will need repeat echocardiogram in 3 months to see if his LV function has improved. Cardiomyopathy felt to possibly have contribution from tachycardia. He will also need outpatient nuclear study to screen for ischemia. Olga Millers

## 2015-05-05 NOTE — H&P (View-Only) (Signed)
Patient Name: Lawrence Delgado Date of Encounter: 05/05/2015   Principal Problem:   Atrial fibrillation (HCC) Active Problems:   Acute systolic CHF (congestive heart failure) (HCC)   Type II diabetes mellitus (HCC)   OBESITY   Essential hypertension   Coronary atherosclerosis   Hyperlipidemia   GERD   Sleep apnea    SUBJECTIVE  Breathing continues to improve.  Has questions about TEE and DCCV - all questions answered.  CURRENT MEDS . antiseptic oral rinse  7 mL Mouth Rinse BID  . apixaban  5 mg Oral BID  . furosemide  40 mg Intravenous Daily  . Influenza vac split quadrivalent PF  0.5 mL Intramuscular Tomorrow-1000  . insulin aspart  0-15 Units Subcutaneous TID WC  . insulin aspart protamine- aspart  50 Units Subcutaneous Q breakfast  . metoprolol succinate  75 mg Oral Daily  . rosuvastatin  40 mg Oral QHS    OBJECTIVE  Filed Vitals:   05/05/15 0020 05/05/15 0444 05/05/15 0500 05/05/15 0740  BP: 112/80 109/80 107/74   Pulse: 90 88 88   Temp: 98.4 F (36.9 C)  98.7 F (37.1 C) 98.6 F (37 C)  TempSrc: Oral  Oral Oral  Resp: Height:      Weight:   268 lb 11.2 oz (121.882 kg)   SpO2: 91% 90% 93%     Intake/Output Summary (Last 24 hours) at 05/05/15 0816 Last data filed at 05/05/15 0740  Gross per 24 hour  Intake      0 ml  Output   1750 ml  Net  -1750 ml   Filed Weights   05/03/15 0454 05/04/15 0430 05/05/15 0500  Weight: 269 lb 6.4 oz (122.199 kg) 270 lb (122.471 kg) 268 lb 11.2 oz (121.882 kg)    PHYSICAL EXAM   General: Pleasant, NAD. Neuro: Alert and oriented X 3. Moves all extremities spontaneously. Psych: Normal affect. HEENT: Normal Neck: Supple without bruits. JVP ~ 10-12 cm - difficult to gauge 2/2 girth. Lungs: Resp regular and unlabored, diminished breath sounds bilat though clearer than yesterday. Heart: IR, IR no s3, s4, or murmurs. Abdomen: Softer than yesterday, obese, non-tender, BS + x 4.  Extremities: No  clubbing, cyanosis or edema. DP/PT/Radials 1+ and equal bilaterally.  Accessory Clinical Findings  CBC  Recent Labs  05/02/15 1649 05/03/15 0548 05/04/15 0438  WBC 8.2 8.6 8.6  NEUTROABS 6.6  --   --   HGB 15.4 15.2 15.1  HCT 45.3 42.9 43.9  MCV 86.6 86.3 87.3  PLT 261 253 253   Basic Metabolic Panel  Recent Labs  05/02/15 1649  05/04/15 0438 05/05/15 0325  NA 137  < > 133* 135  K 4.6  < > 3.6 4.0  CL 102  < > 92* 92*  CO2 24  < > 31 32  GLUCOSE 255*  < > 236* 265*  BUN 12  < > 14 16  CREATININE 1.00  < > 1.00 1.06  CALCIUM 9.4  < > 8.9 9.0  MG 1.4*  --   --   --   < > = values in this interval not displayed. Hemoglobin A1C  Recent Labs  05/02/15 2051  HGBA1C 10.2*   Thyroid Function Tests  Recent Labs  05/02/15 2051  TSH 0.672   TELE  Afib, 80's to 1-teens.  Radiology/Studies  Dg Chest Portable 1 View  05/02/2015   CLINICAL DATA:  Patient presents with EKG changes today.  EXAM:  PORTABLE CHEST 1 VIEW  COMPARISON:  December 31, 2003  FINDINGS: The heart size is enlarged. There is increased pulmonary interstitium bilaterally. There is no focal pneumonia or pleural effusion. No acute abnormalities identified within the visualized bones.  IMPRESSION: Congestive heart failure.   Electronically Signed   By: Sherian Rein M.D.   On: 05/02/2015 15:31   2D Echocardiogram 10.5.2016  Study Conclusions  - Procedure narrative: Transthoracic echocardiography. Image quality was adequate. The study was technically difficult, as a result of poor sound wave transmission and body habitus. Intravenous contrast (Definity) was administered. - Left ventricle: The cavity size was normal. Wall thickness was normal. Systolic function was normal. The estimated ejection fraction was 20%. Diffuse hypokinesis. The study is not technically sufficient to allow evaluation of LV diastolic function. - Left atrium: The atrium was normal in size. - Right atrium: The atrium  was mildly dilated.  ASSESSMENT AND PLAN  1. AFib RVR: Pt presented with a several wk h/o progressive dyspnea and lower ext swelling and was found to be in AF with RVR. He remains on oral BB and IV dilt with rates now in the 80's to 1-teens. Breathing has improved with diuresis though he still has evidence of volume overload. He has now received 5 doses of eliquis (CHA2DS2VASc =4) and is scheduled for TEE/DCCV today @ 1pm.  We will plan on TEE/DCCV in the AM. He had questions about TEE/DCCV again today and after careful review of history and examination, the risks and benefits of transesophageal echocardiogram have been explained including risks of esophageal damage, perforation (1:10,000 risk), bleeding, pharyngeal hematoma as well as other potential complications associated with conscious sedation including aspiration, arrhythmia, respiratory failure and death. Alternatives to treatment were discussed, questions were answered. Patient is willing to proceed.  Post-dccv, provided it is successful, plan to titrate oral BB if possible, d/c CCB given CM, and ambulate.  Possible d/c in AM depending on volume status and symptoms.  2. Acute Systolic CHF: EF 16% with diff HK by echo on 10/5. EF was normal by echo in 03/2014. Suspect new LV dysfxn is most likely tachycardia mediated. He does have a h/o CAD however troponin (only one evaluated) was normal in the ED. He is minus 4.6L since admission and is down 3 lbs, though he reports a wt of 279 @ home (currently 268). He continues to have evidence of volume overload on exam. Renal fxn stable.  Cont IV lasix and toprol xl therapy. He has been prone to relativ hypotension, thus I'm not sure that he'll tolerate an ACEI right now, though once we're able to d/c IV dilt, we may be able to add. As above, plan TEE/DCCV today with probable outpt f/u, nuc study, and echo to r/o ischemia and re-eval EF in sinus rhythm - provided that he is able to hold sinus.    3. Essential HTN: Stable to low on bb and dilt.  Look to add acei once off IV dilt if bp allows.  4. CAD: S/p prior anterior infarct with PCI of his LAD and RCA. Non-ischemic nuc in 08/2011. No c/p on presentation however EF now down - likely tachy-mediated. Plan outpt mv. Cont bb/statin. No asa as he is now on eliquis.  5. HL: Cont crestor. Last LDL in 10/14 was 63. Needs f/u lipids/lft's if not checked since then.  6. Type II DM: Glucoses still running 200-300 despite increasing 70/30 to 50 u.  He's NPO this AM.  Will increase to 54 u  for tomorrow.  7. Morbid Obesity: Would really benefit from outpt exercise and nutritional counseling.  Signed, Nicolasa Ducking NP As above; patient seen and examined. He remains in atrial fibrillation. Plan to continue Cardizem, Toprol and apixaban. Proceed with TEE guided cardioversion. If he does not hold sinus rhythm would likely need an antiarrhythmics such as tikosyn. Once he is in sinus rhythm with plan to discontinue Cardizem and titrate beta blocker and resume ARB. Patient will need repeat echocardiogram in 3 months to see if his LV function has improved. Cardiomyopathy felt to possibly have contribution from tachycardia. He will also need outpatient nuclear study to screen for ischemia. Olga Millers

## 2015-05-05 NOTE — Anesthesia Postprocedure Evaluation (Signed)
  Anesthesia Post-op Note  Patient: Lawrence Delgado  Procedure(s) Performed: Procedure(s) (LRB): TRANSESOPHAGEAL ECHOCARDIOGRAM (TEE) (N/A) CARDIOVERSION (N/A)  Patient Location: PACU  Anesthesia Type: MAC  Level of Consciousness: awake and alert   Airway and Oxygen Therapy: Patient Spontanous Breathing  Post-op Pain: mild  Post-op Assessment: Post-op Vital signs reviewed, Patient's Cardiovascular Status Stable, Respiratory Function Stable, Patent Airway and No signs of Nausea or vomiting  Last Vitals:  Filed Vitals:   05/05/15 1355  BP:   Pulse: 66  Temp:   Resp: 25    Post-op Vital Signs: stable   Complications: No apparent anesthesia complications

## 2015-05-05 NOTE — Interval H&P Note (Signed)
History and Physical Interval Note:  05/05/2015 1:00 PM  Lawrence Delgado  has presented today for surgery, with the diagnosis of AFIB  The various methods of treatment have been discussed with the patient and family. After consideration of risks, benefits and other options for treatment, the patient has consented to  Procedure(s): TRANSESOPHAGEAL ECHOCARDIOGRAM (TEE) (N/A) CARDIOVERSION (N/A) as a surgical intervention .  The patient's history has been reviewed, patient examined, no change in status, stable for surgery.  I have reviewed the patient's chart and labs.  Questions were answered to the patient's satisfaction.     Kendric Sindelar, Deloris Ping

## 2015-05-05 NOTE — CV Procedure (Signed)
    Transesophageal Echocardiogram Note  Lawrence Delgado [id number] 08-23-1955  Procedure: Transesophageal Echocardiogram Indications: atrial fib   Procedure Details Consent: Obtained Time Out: Verified patient identification, verified procedure, site/side was marked, verified correct patient position, special equipment/implants available, Radiology Safety Procedures followed,  medications/allergies/relevent history reviewed, required imaging and test results available.  Performed  Medications: Propofol 120 mg total for TEE and cardioversion   Left Ventrical:  Moderate - severe LV dysfunction.  EF 30-35%  Mitral Valve: mild - mod MR  Aortic Valve: normal AV  Tricuspid Valve: trace - mild TR   Pulmonic Valve: no PI  Left Atrium/ Left atrial appendage: seen well at 0, 45, 90 , and 135 degrees.  No thrombi   Atrial septum: no ASD or PFO by color doppler   Aorta: normal    Complications: No apparent complications Patient did tolerate procedure well.  OK for cardioversion       Cardioversion Note  Lawrence Delgado [id number] February 12, 1956  Procedure: DC Cardioversion Indications: atrial fib   Procedure Details Consent: Obtained Time Out: Verified patient identification, verified procedure, site/side was marked, verified correct patient position, special equipment/implants available, Radiology Safety Procedures followed,  medications/allergies/relevent history reviewed, required imaging and test results available.  Performed  The patient has been on adequate anticoagulation.  The patient received IV propofol ( see above )  for sedation.  Synchronous cardioversion was performed at 120, 200  joules.  The cardioversion was successful     Complications: No apparent complications Patient did tolerate procedure well.   Vesta Mixer, Montez Hageman., MD, [facility] ([facility]) 05/05/2015, 1:21 PM

## 2015-05-05 NOTE — Care Management Important Message (Signed)
Important Message  Patient Details  Name: Lawrence Delgado MRN: [id number] Date of Birth: 1956/03/10   Medicare Important Message Given:  Yes-second notification given    Kyla Balzarine 05/05/2015, 12:08 PM

## 2015-05-05 NOTE — Discharge Instructions (Addendum)
Heart failure instruction: 1. Weigh yourself every morning, call cardiology if weight increase by more than 3 lbs overnight or 5 lbs in a single day 2. Limit salt food.  3. Limit daily fluid intake to <2 Liter per day    Information on my medicine - ELIQUIS (apixaban)  This medication education was reviewed with me or my healthcare representative as part of my discharge preparation.  The pharmacist that spoke with me during my hospital stay was:  Arman Filter, [facility]  Why was Eliquis prescribed for you? Eliquis was prescribed for you to reduce the risk of a blood clot forming that can cause a stroke if you have a medical condition called atrial fibrillation (a type of irregular heartbeat).  What do You need to know about Eliquis ? Take your Eliquis TWICE DAILY - one tablet in the morning and one tablet in the evening with or without food. If you have difficulty swallowing the tablet whole please discuss with your pharmacist how to take the medication safely.  Take Eliquis exactly as prescribed by your doctor and DO NOT stop taking Eliquis without talking to the doctor who prescribed the medication.  Stopping may increase your risk of developing a stroke.  Refill your prescription before you run out.  After discharge, you should have regular check-up appointments with your healthcare provider that is prescribing your Eliquis.  In the future your dose may need to be changed if your kidney function or weight changes by a significant amount or as you get older.  What do you do if you miss a dose? If you miss a dose, take it as soon as you remember on the same day and resume taking twice daily.  Do not take more than one dose of ELIQUIS at the same time to make up a missed dose.  Important Safety Information A possible side effect of Eliquis is bleeding. You should call your healthcare provider right away if you experience any of the following: ? Bleeding from an injury or your  nose that does not stop. ? Unusual colored urine (red or dark brown) or unusual colored stools (red or black). ? Unusual bruising for unknown reasons. ? A serious fall or if you hit your head (even if there is no bleeding).  Some medicines may interact with Eliquis and might increase your risk of bleeding or clotting while on Eliquis. To help avoid this, consult your healthcare provider or pharmacist prior to using any new prescription or non-prescription medications, including herbals, vitamins, non-steroidal anti-inflammatory drugs (NSAIDs) and supplements.  This website has more information on Eliquis (apixaban): http://www.eliquis.com/eliquis/home

## 2015-05-05 NOTE — Progress Notes (Signed)
  Echocardiogram Echocardiogram Transesophageal has been performed.  Lawrence Delgado 05/05/2015, 1:38 PM

## 2015-05-05 NOTE — Anesthesia Preprocedure Evaluation (Addendum)
Anesthesia Evaluation  Patient identified by MRN, date of birth, ID band Patient awake    Reviewed: Allergy & Precautions, H&P , NPO status , Patient's Chart, lab work & pertinent test results  History of Anesthesia Complications Negative for: history of anesthetic complications  Airway Mallampati: I  TM Distance: >3 FB Neck ROM: full    Dental  (+) Edentulous Upper, Missing, Partial Lower   Pulmonary shortness of breath, Recent URI , former smoker,       rales    Cardiovascular hypertension, Pt. on medications + CAD and +CHF  Normal cardiovascular exam Rhythm:Irregular Rate:Tachycardia  Echo from this admission with worsened systolic HF with EF 20%, attributed to tacychardia while patient is in A fib  Cardiac stent to LAD and RCx   Neuro/Psych negative neurological ROS     GI/Hepatic Neg liver ROS, GERD  ,  Endo/Other  diabetes, Poorly Controlled  Renal/GU negative Renal ROS     Musculoskeletal   Abdominal (+) + obese,   Peds  Hematology negative hematology ROS (+)   Anesthesia Other Findings Currently on oxygen, appears overweight and deconditioned  Reproductive/Obstetrics negative OB ROS                             Anesthesia Physical Anesthesia Plan  ASA: IV  Anesthesia Plan: MAC   Post-op Pain Management:    Induction: Intravenous  Airway Management Planned: Nasal Cannula  Additional Equipment:   Intra-op Plan:   Post-operative Plan:   Informed Consent: I have reviewed the patients History and Physical, chart, labs and discussed the procedure including the risks, benefits and alternatives for the proposed anesthesia with the patient or authorized representative who has indicated his/her understanding and acceptance.   Dental Advisory Given  Plan Discussed with: Anesthesiologist, CRNA and Surgeon  Anesthesia Plan Comments:         Anesthesia Quick  Evaluation

## 2015-05-06 DIAGNOSIS — I2510 Atherosclerotic heart disease of native coronary artery without angina pectoris: Secondary | ICD-10-CM

## 2015-05-06 DIAGNOSIS — G4730 Sleep apnea, unspecified: Secondary | ICD-10-CM

## 2015-05-06 DIAGNOSIS — E785 Hyperlipidemia, unspecified: Secondary | ICD-10-CM

## 2015-05-06 DIAGNOSIS — E669 Obesity, unspecified: Secondary | ICD-10-CM

## 2015-05-06 LAB — GLUCOSE, CAPILLARY
GLUCOSE-CAPILLARY: 292 mg/dL — AB (ref 65–99)
GLUCOSE-CAPILLARY: 434 mg/dL — AB (ref 65–99)
Glucose-Capillary: 342 mg/dL — ABNORMAL HIGH (ref 65–99)
Glucose-Capillary: 304 mg/dL — ABNORMAL HIGH (ref 65–99)

## 2015-05-06 MED ORDER — ASPIRIN 81 MG PO CHEW
81.0000 mg | CHEWABLE_TABLET | Freq: Every day | ORAL | Status: DC
  Administered 2015-05-06 – 2015-05-07 (×2): 81 mg via ORAL
  Filled 2015-05-06 (×2): qty 1

## 2015-05-06 MED ORDER — FUROSEMIDE 40 MG PO TABS
40.0000 mg | ORAL_TABLET | Freq: Every day | ORAL | Status: DC
  Administered 2015-05-07: 40 mg via ORAL
  Filled 2015-05-06: qty 1

## 2015-05-06 MED ORDER — FUROSEMIDE 10 MG/ML IJ SOLN
40.0000 mg | Freq: Once | INTRAMUSCULAR | Status: AC
Start: 2015-05-06 — End: 2015-05-06
  Administered 2015-05-06: 40 mg via INTRAVENOUS

## 2015-05-06 MED ORDER — INSULIN ASPART 100 UNIT/ML ~~LOC~~ SOLN
0.0000 [IU] | Freq: Three times a day (TID) | SUBCUTANEOUS | Status: DC
  Administered 2015-05-07: 15 [IU] via SUBCUTANEOUS
  Administered 2015-05-07: 11 [IU] via SUBCUTANEOUS

## 2015-05-06 MED ORDER — INSULIN ASPART 100 UNIT/ML ~~LOC~~ SOLN
5.0000 [IU] | Freq: Once | SUBCUTANEOUS | Status: AC
  Administered 2015-05-06: 5 [IU] via SUBCUTANEOUS

## 2015-05-06 MED ORDER — FUROSEMIDE 10 MG/ML IJ SOLN
40.0000 mg | Freq: Once | INTRAMUSCULAR | Status: DC
  Filled 2015-05-06: qty 4

## 2015-05-06 NOTE — Progress Notes (Signed)
  DAILY PROGRESS NOTE  Subjective:  No events overnight. Underwent successful cardioversion yesterday by Dr. Nahser. EF 35-40%.  Mild dyspnea but walking without difficulty. He is on 2L [place], no home O2 requirement.  Objective:  Temp:  [97.4 F (36.3 C)-98.6 F (37 C)] 98.6 F (37 C) (10/08 1145) Pulse Rate:  [60-110] 71 (10/08 1145) Resp:  [16-27] 20 (10/08 1145) BP: (109-126)/(58-84) 122/73 mmHg (10/08 1145) SpO2:  [74 %-96 %] 93 % (10/08 1145) Weight:  [265 lb 6.9 oz (120.4 kg)-268 lb (121.564 kg)] 265 lb 6.9 oz (120.4 kg) (10/08 0506) Weight change: -11.2 oz (-0.318 kg)  Intake/Output from previous day: 10/07 0701 - 10/08 0700 In: 240 [P.O.:240] Out: 1275 [Urine:1275]  Intake/Output from this shift: Total I/O In: 360 [P.O.:360] Out: 1900 [Urine:1900]  Medications: Current Facility-Administered Medications  Medication Dose Route Frequency Provider Last Rate Last Dose  . antiseptic oral rinse (CPC / CETYLPYRIDINIUM CHLORIDE 0.05%) solution 7 mL  7 mL Mouth Rinse BID Brian S Crenshaw, MD   7 mL at 05/06/15 1000  . apixaban (ELIQUIS) tablet 5 mg  5 mg Oral BID Brian S Crenshaw, MD   5 mg at 05/06/15 0920  . furosemide (LASIX) injection 40 mg  40 mg Intravenous Daily Brian S Crenshaw, MD   40 mg at 05/06/15 0920  . Influenza vac split quadrivalent PF (FLUARIX) injection 0.5 mL  0.5 mL Intramuscular Tomorrow-1000 Brian S Crenshaw, MD   0.5 mL at 05/04/15 2030  . insulin aspart (novoLOG) injection 0-15 Units  0-15 Units Subcutaneous TID WC Brian S Crenshaw, MD   8 Units at 05/06/15 0919  . LORazepam (ATIVAN) tablet 1 mg  1 mg Oral Q8H PRN Brian S Crenshaw, MD      . metoprolol succinate (TOPROL-XL) 24 hr tablet 100 mg  100 mg Oral Daily Philip J Nahser, MD   100 mg at 05/06/15 0920  . rosuvastatin (CRESTOR) tablet 40 mg  40 mg Oral QHS Brian S Crenshaw, MD   40 mg at 05/05/15 2146    Physical Exam: General appearance: alert and no distress Neck: JVD - 2 cm above sternal  notch and no carotid bruit Lungs: diminished breath sounds bibasilar Heart: regular rate and rhythm, S1, S2 normal, no murmur, click, rub or gallop Abdomen: soft, non-tender; bowel sounds normal; no masses,  no organomegaly and obese Extremities: extremities normal, atraumatic, no cyanosis or edema Pulses: 2+ and symmetric Skin: Skin color, texture, turgor normal. No rashes or lesions Neurologic: Grossly normal  Lab Results: Results for orders placed or performed during the hospital encounter of 05/02/15 (from the past 48 hour(s))  Glucose, capillary     Status: Abnormal   Collection Time: 05/04/15  4:44 PM  Result Value Ref Range   Glucose-Capillary 317 (H) 65 - 99 mg/dL  Glucose, capillary     Status: Abnormal   Collection Time: 05/04/15  8:45 PM  Result Value Ref Range   Glucose-Capillary 300 (H) 65 - 99 mg/dL  Basic metabolic panel     Status: Abnormal   Collection Time: 05/05/15  3:25 AM  Result Value Ref Range   Sodium 135 135 - 145 mmol/L   Potassium 4.0 3.5 - 5.1 mmol/L   Chloride 92 (L) 101 - 111 mmol/L   CO2 32 22 - 32 mmol/L   Glucose, Bld 265 (H) 65 - 99 mg/dL   BUN 16 6 - 20 mg/dL   Creatinine, Ser 1.06 0.61 - 1.24 mg/dL   Calcium 9.0 8.9 -   10.3 mg/dL   GFR calc non Af Amer >60 >60 mL/min   GFR calc Af Amer >60 >60 mL/min    Comment: (NOTE) The eGFR has been calculated using the CKD EPI equation. This calculation has not been validated in all clinical situations. eGFR's persistently <60 mL/min signify possible Chronic Kidney Disease.    Anion gap 11 5 - 15  Glucose, capillary     Status: Abnormal   Collection Time: 05/05/15  7:34 AM  Result Value Ref Range   Glucose-Capillary 261 (H) 65 - 99 mg/dL  Glucose, capillary     Status: Abnormal   Collection Time: 05/05/15 11:23 AM  Result Value Ref Range   Glucose-Capillary 258 (H) 65 - 99 mg/dL  Glucose, capillary     Status: Abnormal   Collection Time: 05/05/15  2:38 PM  Result Value Ref Range    Glucose-Capillary 293 (H) 65 - 99 mg/dL  Glucose, capillary     Status: Abnormal   Collection Time: 05/05/15  4:25 PM  Result Value Ref Range   Glucose-Capillary 330 (H) 65 - 99 mg/dL  Glucose, capillary     Status: Abnormal   Collection Time: 05/05/15  5:19 PM  Result Value Ref Range   Glucose-Capillary 331 (H) 65 - 99 mg/dL  Glucose, capillary     Status: Abnormal   Collection Time: 05/05/15 10:06 PM  Result Value Ref Range   Glucose-Capillary 277 (H) 65 - 99 mg/dL  Glucose, capillary     Status: Abnormal   Collection Time: 05/06/15  7:34 AM  Result Value Ref Range   Glucose-Capillary 292 (H) 65 - 99 mg/dL   Comment 1 Notify RN     Imaging: Dg Chest 2 View  05/05/2015   CLINICAL DATA:  Hypoxia.  EXAM: CHEST  2 VIEW  COMPARISON:  Chest radiograph 05/02/2015  FINDINGS: Monitoring leads overlie the patient. Stable cardiomegaly. Diffuse bilateral interstitial pulmonary opacities. Small bilateral pleural effusions. Regional skeleton is unremarkable.  IMPRESSION: Marked cardiomegaly, interstitial pulmonary edema and small bilateral pleural effusions. Overall the edema appears grossly similar when compared to prior given differences in pulmonary inspiration.   Electronically Signed   By: Lovey Newcomer M.D.   On: 05/05/2015 21:40    Assessment:  1. Principal Problem: 2.   Atrial fibrillation ([place]) 3. Active Problems: 4.   Type II diabetes mellitus ([place]) 5.   Hyperlipidemia 6.   OBESITY 7.   Essential hypertension 8.   Coronary atherosclerosis 9.   GERD 10.   Sleep apnea 11.   Acute systolic CHF (congestive heart failure) ([place]) 12.   Plan:  1. Maintain sinus rhythm. He is about 5L negative. On O2, not sure if he needs it. Will switch to po lasix 40 mg daily today. Ambulate off of oxygen to see what his requirements are. If he maintains O2 sat >88% on room air, ok for d/c later today. Will need outpatient myoview for ischemia evaluation given his history of prior stents. Would  recommend continuing aspirin 81 mg daily given his CAD history.   Time Spent Directly with Patient:  15 minutes  Length of Stay:  LOS: 4 days   Pixie Casino, MD, [facility] Attending Cardiologist [place] 05/06/2015, 11:48 AM

## 2015-05-06 NOTE — Progress Notes (Signed)
SATURATION QUALIFICATIONS: (This note is used to comply with regulatory documentation for home oxygen)  Patient Saturations on Room Air at Rest = 91%  Patient Saturations on Room Air while Ambulating = 80-85%  Patient Saturations on 2-3 Liters of oxygen while Ambulating = 88-91%  Please briefly explain why patient needs home oxygen:   Patient's oxygen saturation immediately decreases with any activity. Without oxygen he went down to 80% while walking.

## 2015-05-06 NOTE — Progress Notes (Signed)
  Patient remains hypoxic on RA with ambulation.   SATURATION QUALIFICATIONS: (This note is used to comply with regulatory documentation for home oxygen)  Patient Saturations on Room Air at Rest = 91%  Patient Saturations on Room Air while Ambulating = 80-85%  Patient Saturations on 2-3 Liters of oxygen while Ambulating = 88-91%  Please briefly explain why patient needs home oxygen:   Patient's oxygen saturation immediately decreases with any activity. Without oxygen he went down to 80% while walking.           I've discussed plan with Dr. Rennis Golden. We will delay discharge. We will keep on supplemental O2 and will give another dose of IV Lasix tonight, 40 mg. We will reassess in the am. Hopefull discharge tomorrow.

## 2015-05-06 NOTE — Progress Notes (Signed)
Patient's blood sugar was >400. PA notified and changes were made to sliding scale. Patient is stable.

## 2015-05-07 ENCOUNTER — Other Ambulatory Visit: Payer: Self-pay | Admitting: Physician Assistant

## 2015-05-07 ENCOUNTER — Inpatient Hospital Stay (HOSPITAL_COMMUNITY): Payer: Medicare Other

## 2015-05-07 DIAGNOSIS — I4891 Unspecified atrial fibrillation: Secondary | ICD-10-CM | POA: Diagnosis not present

## 2015-05-07 DIAGNOSIS — I519 Heart disease, unspecified: Secondary | ICD-10-CM

## 2015-05-07 DIAGNOSIS — J90 Pleural effusion, not elsewhere classified: Secondary | ICD-10-CM | POA: Insufficient documentation

## 2015-05-07 DIAGNOSIS — R0689 Other abnormalities of breathing: Secondary | ICD-10-CM

## 2015-05-07 DIAGNOSIS — R0902 Hypoxemia: Secondary | ICD-10-CM | POA: Insufficient documentation

## 2015-05-07 DIAGNOSIS — I5021 Acute systolic (congestive) heart failure: Secondary | ICD-10-CM

## 2015-05-07 DIAGNOSIS — R0600 Dyspnea, unspecified: Secondary | ICD-10-CM | POA: Insufficient documentation

## 2015-05-07 LAB — CBC
HCT: 46 % (ref 39.0–52.0)
Hemoglobin: 16.2 g/dL (ref 13.0–17.0)
MCH: 30.2 pg (ref 26.0–34.0)
MCHC: 35.2 g/dL (ref 30.0–36.0)
MCV: 85.8 fL (ref 78.0–100.0)
PLATELETS: 224 10*3/uL (ref 150–400)
RBC: 5.36 MIL/uL (ref 4.22–5.81)
RDW: 12.4 % (ref 11.5–15.5)
WBC: 7 10*3/uL (ref 4.0–10.5)

## 2015-05-07 LAB — BASIC METABOLIC PANEL
Anion gap: 10 (ref 5–15)
BUN: 12 mg/dL (ref 6–20)
CALCIUM: 9.4 mg/dL (ref 8.9–10.3)
CO2: 35 mmol/L — ABNORMAL HIGH (ref 22–32)
CREATININE: 1.05 mg/dL (ref 0.61–1.24)
Chloride: 88 mmol/L — ABNORMAL LOW (ref 101–111)
GFR calc Af Amer: >60 mL/min (ref >60)
GLUCOSE: 371 mg/dL — AB (ref 65–99)
Potassium: 3.8 mmol/L (ref 3.5–5.1)
Sodium: 133 mmol/L — ABNORMAL LOW (ref 135–145)

## 2015-05-07 LAB — GLUCOSE, CAPILLARY
Glucose-Capillary: 280 mg/dL — ABNORMAL HIGH (ref 65–99)
Glucose-Capillary: 314 mg/dL — ABNORMAL HIGH (ref 65–99)

## 2015-05-07 MED ORDER — METOPROLOL SUCCINATE ER 50 MG PO TB24: 100.0000 mg | ORAL_TABLET | Freq: Every day | ORAL | Status: DC

## 2015-05-07 MED ORDER — ASPIRIN 81 MG PO CHEW: 81.0000 mg | CHEWABLE_TABLET | Freq: Every day | ORAL | Status: DC

## 2015-05-07 MED ORDER — APIXABAN 5 MG PO TABS: 5.0000 mg | ORAL_TABLET | Freq: Two times a day (BID) | ORAL | Status: DC

## 2015-05-07 MED ORDER — FUROSEMIDE 40 MG PO TABS: 40.0000 mg | ORAL_TABLET | Freq: Every day | ORAL | Status: DC

## 2015-05-07 MED ORDER — IRBESARTAN 75 MG PO TABS: 75.0000 mg | ORAL_TABLET | Freq: Every day | ORAL | Status: DC

## 2015-05-07 NOTE — Progress Notes (Signed)
Pt discharged home with wife.  Reviewed discharge instructions and education, all questions answered.  Assessment unchanged from earlier.  

## 2015-05-07 NOTE — Care Management Note (Signed)
Case Management Note  Patient Details  Name: Lawrence Delgado MRN: [id number] Date of Birth: [birth year]  Subjective/Objective:                   Atrial fibrillation [facility]) Active Problems:  Acute systolic CHF Action/Plan: Discharge planning  Expected Discharge Date:  05/07/15               Expected Discharge Plan:  Home/Self Care  In-House Referral:     Discharge planning Services  CM Consult  Post Acute Care Choice:  NA Choice offered to:  NA  DME Arranged:  Oxygen DME Agency:  [facility]  HH Arranged:    HH Agency:  NA  Status of Service:  Completed, signed off  Medicare Important Message Given:  Yes-second notification given Date Medicare IM Given:    Medicare IM give by:    Date Additional Medicare IM Given:    Additional Medicare Important Message give by:     If discussed at Long Length of Stay Meetings, dates discussed:    Additional Comments: CM spoke with spouse of pt to see if they received free 30 day trial card for Eliquis; previous Cm gave them the card.  CM called [facility] rep, tiffany to please arrange home oxygen and [facility] DMe rep, Trey Paula to please deliver the oxygen tank to room so pt can discharge.  No other CM needs were communicated. [name], RN 05/07/2015, 12:12 PM

## 2015-05-07 NOTE — Progress Notes (Signed)
SATURATION QUALIFICATIONS: (This note is used to comply with regulatory documentation for home oxygen)  Patient Saturations on Room Air at Rest = 93%  Patient Saturations on Room Air while Ambulating = 83%  Patient Saturations on 4 Liters of oxygen while Ambulating = 96%  Please briefly explain why patient needs home oxygen:

## 2015-05-07 NOTE — Discharge Summary (Signed)
Discharge Summary   Patient ID: Lawrence Delgado,  MRN: [id number], DOB/AGE: 09-06-1955 59 y.o.  Admit date: 05/02/2015 Discharge date: 05/07/2015  Primary Care Provider: Samuel Jester Primary Cardiologist: Dr. Jens Som  Discharge Diagnoses Principal Problem:   Atrial fibrillation [facility]) Active Problems:   Type II diabetes mellitus (HCC)   Hyperlipidemia   OBESITY   Essential hypertension   Coronary atherosclerosis   GERD   Sleep apnea   Acute systolic CHF (congestive heart failure) (HCC)   Dyspnea   Hypoxia   Pleural effusion   Allergies Allergies  Allergen Reactions  . Codeine     Liquid for caused REACTION: Nausea/vomiting  . Clopidogrel Bisulfate Itching and Rash    Procedures  Echocardiogram 05/03/2015 LV EF: 20%  ------------------------------------------------------------------- Indications:   Atrial fibrillation - 427.31.  ------------------------------------------------------------------- History:  PMH:  Chest pain. Dyspnea. Coronary artery disease. Risk factors: Hypertension. Diabetes mellitus. Dyslipidemia.  ------------------------------------------------------------------- Study Conclusions  - Procedure narrative: Transthoracic echocardiography. Image quality was adequate. The study was technically difficult, as a result of poor sound wave transmission and body habitus. Intravenous contrast (Definity) was administered. - Left ventricle: The cavity size was normal. Wall thickness was normal. Systolic function was normal. The estimated ejection fraction was 20%. Diffuse hypokinesis. The study is not technically sufficient to allow evaluation of LV diastolic function. - Left atrium: The atrium was normal in size. - Right atrium: The atrium was mildly dilated.  Impressions:  - Technically difficult study - definity contrast given. There is severe global hypokinesis, LVEF 20%. This is a marked reduction in function  compared to the prior study in 2015.     TEE DCCV by Dr. Elease Hashimoto 10/7 Left Ventrical: Moderate - severe LV dysfunction. EF 30-35%  Mitral Valve: mild - mod MR  Aortic Valve: normal AV  Tricuspid Valve: trace - mild TR   Pulmonic Valve: no PI  Left Atrium/ Left atrial appendage: seen well at 0, 45, 90 , and 135 degrees. No thrombi   Atrial septum: no ASD or PFO by color doppler   Aorta: normal    Complications: No apparent complications Patient did tolerate procedure well.  OK for cardioversion     Hospital Course  The patient is a 59 year old male with past medical history of CAD status post PCI to LAD and RCA, hypertension, hyperlipidemia, diabetes, obesity and the obstructive sleep apnea. His last Myoview in February 2013 showed EF 41%, anterior wall infarct but no ischemia. His last echocardiogram in September 2015 showed EF 55%. He presented to [facility] on [facility]2015 with new onset of atrial fibrillation. On arrival to the ED, he was noted to be in atrial fibrillation with RVR.  He also complained of increasing dyspnea on exertion, orthopnea and pedal edema for the past 2 weeks. His symptom was concerning for acute heart failure in the setting of A. fib with RVR. He was admitted to cartilage service for rate control and IV diuresis.   he was continued on home Toprol with addition of IV diltiazem. TSH was normal. He has a CHA2DS2-VASC score of 4 (CHF, CAD, HTN, DM), therefore he was started on eliquis 5mg  BID.  echocardiogram obtained on 05/03/2015 showed EF 20%, diffuse hypokinesis, normal LA size. It was felt that the new LV dysfunction likely is tachycardia mediated, however given his history cannot rule out underlying significant CAD. Therefore outpatient Myoview is needed. Unfortunately, patient continued to be atrial fibrillation with difficult to control heart rate despite increased Toprol dose. He eventually underwent TEE  DCCV  on 05/05/2015 which showed EF  30-35%, he was successfully cardioverted to normal sinus rhythm. IV diltiazem stopped. He continued IV diuresis for additional 2 days. He was seen in the morning of 10/90/2016, chest x-ray was repeated which showed improvement in pulmonary edema, however does show persistent pulmonary vascular congestion. He has diuresed a total 7.8 L during this admission. Weight is down from 271 pounds on arrival to 258 pounds today which will serve as his new dry weight. I have discussed with the patient heart failure prevention including sodium and fluid restriction and daily weight. A low-dose irbesartan has been restarted at 75 mg daily given the increase of his Toprol XL home dose to  daily.   Prior to discharge, nurse ambulated the patient, his O2 saturation went down to 83% with ambulation. He qualified for home oxygen. case manager for arrange home oxygen prior to discharge. I have sent staff message to our scheduler to contact the patient to arrange 5-7 day transition of care follow-up with one week outpatient basic metabolic panel to assess renal function. He will continue on 40 mg daily Lasix. I will also arrange 1-2 weeks outpatient Lexiscan Myoview. He will need a repeat echocardiogram in 3 month to a assess LV function.  Of note, prior authorization is required for eliquis ([employer] preop authorization line [id number]). I have attempted to call this number for prior authorization, unfortunately this is the weekend, the office is closed. I will attempt again tomorrow.    Discharge Vitals Blood pressure 132/81, pulse 72, temperature 98.6 F (37 C), temperature source Oral, resp. rate 18, height  (1.854 m), weight 258 lb 12.8 oz (117.391 kg), SpO2 92 %.  Filed Weights   05/06/15 0500 05/06/15 0506 05/07/15 0400  Weight: 265 lb 6.9 oz (120.4 kg) 265 lb 6.9 oz (120.4 kg) 258 lb 12.8 oz (117.391 kg)    Labs  Basic Metabolic Panel  Recent Labs  05/05/15 0325  NA 135  K 4.0  CL 92*  CO2  32  GLUCOSE 265*  BUN 16  CREATININE 1.06  CALCIUM 9.0    Disposition  Pt is being discharged home today in good condition.  Follow-up Plans & Appointments      Follow-up Information    Follow up with [facility] On 06/14/2015.   Specialty:  Cardiology   Why:  4:30pm. Cardiology followup with Dr. Magdalene River information:   [address] [place] [address] [phone]      Follow up with [facility].   Specialty:  Cardiology   Why:  Office will contact you to arrange 1 week followup with Dr. Ludwig Clarks PA/NP in the clinic. You will need to obtain BMET on the same day of followup to monitor renal function. Please be aware of location as we have multiple location in [facility] information:   [address], [address] [place] [address] [phone]      Follow up with [facility]   Why:  Heritage manager will arrange 1-2 week lexiscan myoview in the office   Contact information:   [address] [place] [address] [phone]      Please follow up.   Why:  Case manager will set up home oxygen prior to discharge      Discharge Medications    Medication List    STOP taking these medications  aspirin 325 MG tablet  Replaced by:  aspirin 81 MG chewable tablet      TAKE these medications        apixaban 5 MG Tabs tablet  Commonly known as:  ELIQUIS  Take 1 tablet (5 mg total) by mouth 2 (two) times daily.     aspirin 81 MG chewable tablet  Chew 1 tablet (81 mg total) by mouth daily.     furosemide 40 MG tablet  Commonly known as:  LASIX  Take 1 tablet (40 mg total) by mouth daily.     insulin lispro protamine-lispro (75-25) 100 UNIT/ML Susp injection  Commonly known as:  HUMALOG 75/25 MIX  Inject 45 Units into the skin daily.     irbesartan 75 MG tablet  Commonly known as:  AVAPRO  Take 1 tablet (75 mg total) by  mouth daily.     LORazepam 1 MG tablet  Commonly known as:  ATIVAN  Take 1 mg by mouth every 8 (eight) hours as needed for anxiety.     metFORMIN 500 MG tablet  Commonly known as:  GLUCOPHAGE  Take 500-1,000 mg by mouth 2 (two) times daily with a meal. Take 1000 mg in the morning and 500 mg at night     metoprolol succinate 50 MG 24 hr tablet  Commonly known as:  TOPROL-XL  Take 2 tablets (100 mg total) by mouth daily.     nitroGLYCERIN 0.4 MG SL tablet  Commonly known as:  NITROSTAT  Place 1 tablet (0.4 mg total) under the tongue every 5 (five) minutes as needed.     rosuvastatin 40 MG tablet  Commonly known as:  CRESTOR  TAKE 1 TABLET DAILY     Vitamin D (Ergocalciferol) 50000 UNITS Caps capsule  Commonly known as:  DRISDOL  Take 50,000 Units by mouth 2 (two) times a week. Monday and Thursday        Outstanding Labs/Studies  BMET in 1 week  Duration of Discharge Encounter   Greater than 30 minutes including physician time.  Ramond Dial PA-C Pager: [phone] 05/07/2015, 11:59 AM

## 2015-05-07 NOTE — Progress Notes (Signed)
Patient Name: Lawrence Delgado Date of Encounter: 05/07/2015  Primary Cardiologist: Dr. Jens Som   Principal Problem:   Atrial fibrillation [facility]) Active Problems:   Type II diabetes mellitus (HCC)   Hyperlipidemia   OBESITY   Essential hypertension   Coronary atherosclerosis   GERD   Sleep apnea   Acute systolic CHF (congestive heart failure) (HCC)    SUBJECTIVE  Denies any CP. States he did not sleep last night, but again, he does not usually sleep at night even at home. Chronic sleeping issue. SOB improved, he wish to get out of here. Still on 3-4L home O2.   CURRENT MEDS . antiseptic oral rinse  7 mL Mouth Rinse BID  . apixaban  5 mg Oral BID  . aspirin  81 mg Oral Daily  . furosemide  40 mg Oral Daily  . Influenza vac split quadrivalent PF  0.5 mL Intramuscular Tomorrow-1000  . insulin aspart  0-20 Units Subcutaneous TID WC  . metoprolol succinate  100 mg Oral Daily  . rosuvastatin  40 mg Oral QHS    OBJECTIVE  Filed Vitals:   05/06/15 2000 05/07/15 0400 05/07/15 0555 05/07/15 0600  BP: 134/75 132/81    Pulse: 73 72    Temp: 98.7 F (37.1 C) 98.6 F (37 C)    TempSrc:      Resp: 21 18    Height:      Weight:  258 lb 12.8 oz (117.391 kg)    SpO2: 93% 95% 76% 92%    Intake/Output Summary (Last 24 hours) at 05/07/15 0922 Last data filed at 05/07/15 0842  Gross per 24 hour  Intake    640 ml  Output   2675 ml  Net  -2035 ml   Filed Weights   05/06/15 0500 05/06/15 0506 05/07/15 0400  Weight: 265 lb 6.9 oz (120.4 kg) 265 lb 6.9 oz (120.4 kg) 258 lb 12.8 oz (117.391 kg)    PHYSICAL EXAM  General: Pleasant, NAD. Neuro: Alert and oriented X 3. Moves all extremities spontaneously. Psych: Normal affect. HEENT:  Normal  Neck: Supple without bruits or JVD. Lungs:  Resp regular and unlabored. Markedly diminished breath sound in bilateral bases. Heart: RRR no s3, s4, or murmurs. Abdomen: Soft, non-tender, non-distended, BS + x 4.  Extremities: No  clubbing, cyanosis or edema. DP/PT/Radials 2+ and equal bilaterally.  Accessory Clinical Findings  Basic Metabolic Panel  Recent Labs  05/05/15 0325  NA 135  K 4.0  CL 92*  CO2 32  GLUCOSE 265*  BUN 16  CREATININE 1.06  CALCIUM 9.0    TELE NSR with HR 80s    ECG  No new EKG  Echocardiogram 05/03/2015  LV EF: 20%  ------------------------------------------------------------------- Indications:   Atrial fibrillation - 427.31.  ------------------------------------------------------------------- History:  PMH:  Chest pain. Dyspnea. Coronary artery disease. Risk factors: Hypertension. Diabetes mellitus. Dyslipidemia.  ------------------------------------------------------------------- Study Conclusions  - Procedure narrative: Transthoracic echocardiography. Image quality was adequate. The study was technically difficult, as a result of poor sound wave transmission and body habitus. Intravenous contrast (Definity) was administered. - Left ventricle: The cavity size was normal. Wall thickness was normal. Systolic function was normal. The estimated ejection fraction was 20%. Diffuse hypokinesis. The study is not technically sufficient to allow evaluation of LV diastolic function. - Left atrium: The atrium was normal in size. - Right atrium: The atrium was mildly dilated.  Impressions:  - Technically difficult study - definity contrast given. There is severe global hypokinesis, LVEF 20%.  This is a marked reduction in function compared to the prior study in 2015.    Radiology/Studies  Dg Chest 2 View  05/05/2015   CLINICAL DATA:  Hypoxia.  EXAM: CHEST  2 VIEW  COMPARISON:  Chest radiograph 05/02/2015  FINDINGS: Monitoring leads overlie the patient. Stable cardiomegaly. Diffuse bilateral interstitial pulmonary opacities. Small bilateral pleural effusions. Regional skeleton is unremarkable.  IMPRESSION: Marked cardiomegaly, interstitial  pulmonary edema and small bilateral pleural effusions. Overall the edema appears grossly similar when compared to prior given differences in pulmonary inspiration.   Electronically Signed   By: Annia Belt M.D.   On: 05/05/2015 21:40   Dg Chest Portable 1 View  05/02/2015   CLINICAL DATA:  Patient presents with EKG changes today.  EXAM: PORTABLE CHEST 1 VIEW  COMPARISON:  December 31, 2003  FINDINGS: The heart size is enlarged. There is increased pulmonary interstitium bilaterally. There is no focal pneumonia or pleural effusion. No acute abnormalities identified within the visualized bones.  IMPRESSION: Congestive heart failure.   Electronically Signed   By: Sherian Rein M.D.   On: 05/02/2015 15:31    ASSESSMENT AND PLAN  1. New onset atrial fibrillation with RVR  - CHADSvasc-4 (HTN, DM, HF, CAD). TSH normal  - started on eliquis  BID   - continue toprol XL  daily.   - s/p TEE with DCCV 05/05/2015 EF 30-35%, successful cardioversion.   2. Acute systolic heart failure with chronic diastolic heart failure - in the setting of new afib  - Echo 05/03/2015 EF 20%, diffuse hypokinesis, normal LA size. Felt new LV dysfunction is tachycardia mediated.   - given additional dose of IV lasix yesterday  - plan for outpatient myoview and repeat echo in 3 month to assess LV function  - diuresed -7.8L. Weight down from 271 to 258 lbs. Transitioned to PO lasix. CXR last night showed persistent pulm edema with pleural effusion, i did not appreciate significant rale this morning, however exam does correlate with pleural effusion with decreased breath sound in bilateral bases. Will need 1 week outpatient BMET.   - will restart home Irbesartan at lower dose  daily  - nurse to ambulate the patient again today, likely qualify for home O2  3. CAD s/p PCI to LAD and RCA  - myoview 08/2011 EF 41%, anterior wall infarct, no ischemia  - echo 03/2014 EF 55%, grade 2 diastolic dysfunction. Moderate LAE. Small  pericardial effusion  - per Dr. Rennis Golden, will consider ASA given h/o CAD  4. HTN 5. HLD 6. DM 7. OSA  Signed, Azalee Course PA-C Pager: [phone]

## 2015-05-07 NOTE — Progress Notes (Signed)
Notified Dr. Okey Dupre of [facility] of 304.

## 2015-05-08 ENCOUNTER — Encounter (HOSPITAL_COMMUNITY): Payer: Self-pay | Admitting: Cardiovascular Disease

## 2015-05-08 ENCOUNTER — Telehealth: Payer: Self-pay | Admitting: Cardiology

## 2015-05-08 ENCOUNTER — Telehealth: Payer: Self-pay | Admitting: Physician Assistant

## 2015-05-08 NOTE — Telephone Encounter (Signed)
Pt will need a post hosp f/u call He is scheduled to see Bjorn Loser on 10/24 at 9:00am and Dr. Jens Som on 11/16.  Thanks

## 2015-05-08 NOTE — Telephone Encounter (Signed)
Patient contacted regarding discharge from [facility] on 05/07/15.  Patient understands to follow up with provider Rhonda  on 05/22/15 at 9 am at [facility]. Patient understands discharge instructions? yes  Patient understands medications and regiment? yes  Patient understands to bring all medications to this visit? yes  Patient is aware of appointment 11/16 at 4:30 with Dr Jens Som in [place].

## 2015-05-08 NOTE — Telephone Encounter (Signed)
Called [employer] for prior authorization, he has been approved for eliquis 1 yr until May 07 2016  Approval #: [id number]  Signed, Azalee Course PA Pager: [phone]

## 2015-05-09 ENCOUNTER — Telehealth: Payer: Self-pay | Admitting: Cardiology

## 2015-05-09 NOTE — Telephone Encounter (Signed)
New message      Pt was discharged form hosp on Sunday.  They have a question about the dosage on his toprol; and should pt chew or swallow his aspirin?

## 2015-05-09 NOTE — Telephone Encounter (Signed)
DC ASA as patient on apixaban. Lawrence Delgado

## 2015-05-09 NOTE — Telephone Encounter (Signed)
Spoke with patient and wife and verified that metoprolol succinate had been increased to  daily.  He asked if he could take EC asa  versus chewable  aspirin as noted on his discharge summary.  I was unable to locate anything denoting why chewable was preferred, but advised he take chewable until advised otherwise since it was specified.   Will defer to Dr. Jens Som and contact patient accordingly.

## 2015-05-09 NOTE — Telephone Encounter (Signed)
Spoke with pt, Aware of dr crenshaw's recommendations.  °

## 2015-05-10 ENCOUNTER — Encounter: Payer: Self-pay | Admitting: Cardiology

## 2015-05-12 ENCOUNTER — Telehealth: Payer: Self-pay | Admitting: Cardiology

## 2015-05-12 NOTE — Telephone Encounter (Signed)
Pt has appt on 05-22-15 with Glean SalenRhonda Barrett,[facility]?

## 2015-05-12 NOTE — Telephone Encounter (Signed)
Spoke with pt, [place] want [place]. He is going to see his PCP and will get BMP and EKG with her and have those results faxed to us.

## 2015-05-17 ENCOUNTER — Telehealth: Payer: Self-pay | Admitting: Cardiology

## 2015-05-17 NOTE — Telephone Encounter (Signed)
New message      Did you get lab results from Dr Haskel Khanynthia Butler's office recently?

## 2015-05-17 NOTE — Telephone Encounter (Signed)
Spoke with pt, he had an EKG and lab work done yesterday with dr Charm Bargesbutler, they are to fax the results. Aware we have not received anything yet.

## 2015-05-18 ENCOUNTER — Encounter: Payer: Self-pay | Admitting: Cardiology

## 2015-05-18 NOTE — Telephone Encounter (Signed)
Follow up     Calling to get results on ekg and labs.  Pt had these test done at Dr Haskel Khanynthia Butler's office.  They were going to fax the results to us

## 2015-05-19 NOTE — Telephone Encounter (Signed)
Spoke with pt, dr butler's office is going to refax the records. Questions regarding recent hosp and follow up testing answered.

## 2015-05-19 NOTE — Telephone Encounter (Signed)
Says she was waiting to hear from you.She had not heard anything since Wednesday.

## 2015-05-22 ENCOUNTER — Ambulatory Visit: Payer: Medicare Other | Admitting: Physician Assistant

## 2015-05-31 ENCOUNTER — Telehealth (HOSPITAL_COMMUNITY): Payer: Self-pay

## 2015-05-31 NOTE — Telephone Encounter (Signed)
Patient given detailed instructions per Myocardial Perfusion Study Information Sheet for the test on 05-31-2015 at 0945. Patient notified to arrive 15 minutes early and that it is imperative to arrive on time for appointment to keep from having the test rescheduled.  If you need to cancel or reschedule your appointment, please call the office within 24 hours of your appointment. Failure to do so may result in a cancellation of your appointment, and a $50 no show fee. Patient verbalized understanding.Randa EvensEdwards, Guadalupe Kerekes A

## 2015-05-31 NOTE — Telephone Encounter (Signed)
Recent phone call to the patient giving detailed instructions per Myocardial Perfusion Study Information Sheet for the test, but  on 06-05-2015 at 0945. Randa EvensEdwards, Tipton Ballow A

## 2015-06-05 ENCOUNTER — Ambulatory Visit (HOSPITAL_COMMUNITY): Payer: Medicare Other | Attending: Cardiology

## 2015-06-05 DIAGNOSIS — R002 Palpitations: Secondary | ICD-10-CM | POA: Diagnosis not present

## 2015-06-05 DIAGNOSIS — I519 Heart disease, unspecified: Secondary | ICD-10-CM | POA: Diagnosis not present

## 2015-06-05 DIAGNOSIS — E119 Type 2 diabetes mellitus without complications: Secondary | ICD-10-CM | POA: Diagnosis not present

## 2015-06-05 DIAGNOSIS — I2510 Atherosclerotic heart disease of native coronary artery without angina pectoris: Secondary | ICD-10-CM

## 2015-06-05 DIAGNOSIS — R0609 Other forms of dyspnea: Secondary | ICD-10-CM | POA: Insufficient documentation

## 2015-06-05 DIAGNOSIS — I517 Cardiomegaly: Secondary | ICD-10-CM | POA: Insufficient documentation

## 2015-06-05 DIAGNOSIS — R9439 Abnormal result of other cardiovascular function study: Secondary | ICD-10-CM | POA: Insufficient documentation

## 2015-06-05 DIAGNOSIS — I10 Essential (primary) hypertension: Secondary | ICD-10-CM | POA: Insufficient documentation

## 2015-06-05 DIAGNOSIS — E785 Hyperlipidemia, unspecified: Secondary | ICD-10-CM | POA: Insufficient documentation

## 2015-06-05 LAB — MYOCARDIAL PERFUSION IMAGING
CHL CUP RESTING HR STRESS: 64 {beats}/min
LV sys vol: 107 mL
LVDIAVOL: 186 mL
NUC STRESS TID: 0.33
Peak HR: 74 {beats}/min
RATE: 1.04
SDS: 3
SRS: 12
SSS: 15

## 2015-06-05 MED ORDER — TECHNETIUM TC 99M SESTAMIBI GENERIC - CARDIOLITE
10.2000 | Freq: Once | INTRAVENOUS | Status: AC | PRN
  Administered 2015-06-05: 10 via INTRAVENOUS

## 2015-06-05 MED ORDER — TECHNETIUM TC 99M SESTAMIBI GENERIC - CARDIOLITE
32.8000 | Freq: Once | INTRAVENOUS | Status: AC | PRN
  Administered 2015-06-05: 32.8 via INTRAVENOUS

## 2015-06-05 MED ORDER — REGADENOSON 0.4 MG/5ML IV SOLN
0.4000 mg | Freq: Once | INTRAVENOUS | Status: AC
  Administered 2015-06-05: 0.4 mg via INTRAVENOUS

## 2015-06-06 ENCOUNTER — Telehealth: Payer: Self-pay | Admitting: Cardiology

## 2015-06-06 ENCOUNTER — Encounter: Payer: Self-pay | Admitting: Cardiology

## 2015-06-06 NOTE — Telephone Encounter (Signed)
Pt's wife called in wanting to know if the results to his stress test were available. Please f/u with her  Thanks

## 2015-06-07 NOTE — Telephone Encounter (Signed)
Pt calling again,he would like his Stress Test results please.

## 2015-06-07 NOTE — Telephone Encounter (Signed)
pt aware of results  

## 2015-06-07 NOTE — Progress Notes (Signed)
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 Admitted with new onset atrial fibrillation 10/16. TSH normal. Echocardiogram October 2016 was technically difficult. With Definity ejection fraction was felt to be 20%. Transesophageal echocardiogram October 2016 showed ejection fraction 35-40% and mild mitral regurgitation. Nuclear study 11/16 showed EF 43, prior anterior apical infarct and no ischemia. Since last seen, No dyspnea, chest pain, palpitations or syncope.  Current Outpatient Prescriptions  Medication Sig Dispense Refill  . apixaban (ELIQUIS) 5 MG TABS tablet Take 1 tablet (5 mg total) by mouth 2 (two) times daily. 60 tablet 11  . furosemide (LASIX) 40 MG tablet Take 1 tablet (40 mg total) by mouth daily. 30 tablet 3  . HUMALOG MIX 75/25 KWIKPEN (75-25) 100 UNIT/ML Kwikpen     . insulin lispro protamine-lispro (HUMALOG 75/25 MIX) (75-25) 100 UNIT/ML SUSP injection Inject 45 Units into the skin as directed.     . irbesartan (AVAPRO) 75 MG tablet Take 1 tablet (75 mg total) by mouth daily. 30 tablet 5  . LORazepam (ATIVAN) 1 MG tablet Take 1 mg by mouth every 8 (eight) hours as needed for anxiety.     . metFORMIN (GLUCOPHAGE) 500 MG tablet Take 500 mg by mouth 2 (two) times daily with a meal. Take 1000 mg in the morning and 500 mg at night    . metoprolol succinate (TOPROL-XL) 50 MG 24 hr tablet Take 2 tablets (100 mg total) by mouth daily. 60 tablet 11  . nitroGLYCERIN (NITROSTAT) 0.4 MG SL tablet Place 1 tablet (0.4 mg total) under the tongue every 5 (five) minutes as needed. 25 tablet 5  . rosuvastatin (CRESTOR) 40 MG tablet TAKE 1 TABLET DAILY (Patient taking differently: Take 40 mg by mouth at bedtime. TAKE 1 TABLET DAILY) 90 tablet 3  . Vitamin D, Ergocalciferol, (DRISDOL) 50000 UNITS CAPS Take 50,000 Units by mouth 2 (two) times a week. Monday and Thursday     No current facility-administered medications for this  visit.     Past Medical History  Diagnosis Date  . CAD (coronary artery disease)   . GERD (gastroesophageal reflux disease)   . DM (diabetes mellitus) (HCC)   . HLD (hyperlipidemia)   . HTN (hypertension)   . Sleep apnea   . Obesity     Past Surgical History  Procedure Laterality Date  . Coronary stent placement      eluting stent placement in the culpric lesion of the LAD  . Cardiac catheterization      with coronary angiography   . Tee without cardioversion N/A 05/05/2015    Procedure: TRANSESOPHAGEAL ECHOCARDIOGRAM (TEE);  Surgeon: Vesta Mixer, MD;  Location: [facility] ENDOSCOPY;  Service: Cardiovascular;  Laterality: N/A;  . Cardioversion N/A 05/05/2015    Procedure: CARDIOVERSION;  Surgeon: Vesta Mixer, MD;  Location: [facility] ENDOSCOPY;  Service: Cardiovascular;  Laterality: N/A;    Social History   Social History  . Marital Status: Married    Spouse Name: N/A  . Number of Children: N/A  . Years of Education: N/A   Occupational History  . Not on file.   Social History Main Topics  . Smoking status: Former Games developer  . Smokeless tobacco: Not on file  . Alcohol Use: No  . Drug Use: No  . Sexual Activity: Not on file   Other Topics Concern  . Not on file   Social History Narrative    ROS:  no fevers or chills, productive cough, hemoptysis, dysphasia, odynophagia, melena, hematochezia, dysuria, hematuria, rash, seizure activity, orthopnea, PND, pedal edema, claudication. Remaining systems are negative.  Physical Exam: Well-developed obese in no acute distress.  Skin is warm and dry.  HEENT is normal.  Neck is supple.  Chest is clear to auscultation with normal expansion.  Cardiovascular exam is regular rate and rhythm.  Abdominal exam nontender or distended. No masses palpated. Extremities show no edema. neuro grossly intact

## 2015-06-09 ENCOUNTER — Telehealth: Payer: Self-pay | Admitting: Cardiology

## 2015-06-09 NOTE — Telephone Encounter (Signed)
Please call today if possible,does not want to go through the weekend not knowing,

## 2015-06-09 NOTE — Telephone Encounter (Signed)
pt aware of results  

## 2015-06-09 NOTE — Telephone Encounter (Signed)
Wife said they have been waiting to hear from his stress test from Monday.

## 2015-06-14 ENCOUNTER — Encounter: Payer: Self-pay | Admitting: Cardiology

## 2015-06-14 ENCOUNTER — Ambulatory Visit (INDEPENDENT_AMBULATORY_CARE_PROVIDER_SITE_OTHER): Payer: Medicare Other | Admitting: Cardiology

## 2015-06-14 VITALS — BP 118/68 | HR 65 | Ht 73.0 in | Wt 267.0 lb

## 2015-06-14 DIAGNOSIS — E785 Hyperlipidemia, unspecified: Secondary | ICD-10-CM

## 2015-06-14 DIAGNOSIS — I480 Paroxysmal atrial fibrillation: Secondary | ICD-10-CM

## 2015-06-14 DIAGNOSIS — I10 Essential (primary) hypertension: Secondary | ICD-10-CM

## 2015-06-14 DIAGNOSIS — I2510 Atherosclerotic heart disease of native coronary artery without angina pectoris: Secondary | ICD-10-CM | POA: Diagnosis not present

## 2015-06-14 DIAGNOSIS — I5021 Acute systolic (congestive) heart failure: Secondary | ICD-10-CM

## 2015-06-14 NOTE — Assessment & Plan Note (Signed)
Continue statin. 

## 2015-06-14 NOTE — Assessment & Plan Note (Addendum)
Patient's heart failure is much improved followingRestoration of sinus rhythm. Continue beta blocker and ARB. Continue present dose of Lasix.

## 2015-06-14 NOTE — Assessment & Plan Note (Signed)
Continue statin. Not on aspirin given need for anticoagulation. 

## 2015-06-14 NOTE — Assessment & Plan Note (Signed)
Patient in sinus rhythm on examination. Continue beta blocker and apixaban.

## 2015-06-14 NOTE — Patient Instructions (Signed)
Your physician recommends that you schedule a follow-up appointment in: 3 MONTHS WITH DR CRENSHAW  

## 2015-06-14 NOTE — Assessment & Plan Note (Signed)
Blood pressure controlled. Continue present medications. 

## 2015-06-16 ENCOUNTER — Telehealth: Payer: Self-pay | Admitting: Cardiology

## 2015-06-16 NOTE — Telephone Encounter (Signed)
Returned call to patient.Quay Burowdvised Lawrence Delgado out of office today.Stated she was going to ask Dr.Crenshaw if he can stop O2 .Advised [facility].

## 2015-06-16 NOTE — Telephone Encounter (Signed)
Pt is calling back to speak with [name] what Dr. Jens Somrenshaw thought about him(the pt) getting out of oxygen. Please f/u with pt  Thanks

## 2015-06-19 NOTE — Telephone Encounter (Signed)
Unable to reach pt or leave a message  

## 2015-06-20 ENCOUNTER — Other Ambulatory Visit: Payer: Self-pay | Admitting: *Deleted

## 2015-06-20 DIAGNOSIS — R0902 Hypoxemia: Secondary | ICD-10-CM

## 2015-06-20 NOTE — Telephone Encounter (Signed)
Pt's wife called in stating that here lately the pt's stool has been a very dark color almost black. She would like to know if this could be because of the medication. Please f/u with pt  Thanks

## 2015-06-20 NOTE — Telephone Encounter (Signed)
Spoke with pt, he has noticed some dark stools and some diarrhea. He is questioning if it could be related to the eliquis. He has talked to his medical doctor and she is sending him imodium for the diarrhea. The eliquis was started earlier this month and he has had the diarrhea for the last 4 to 5 days. He is going to try the imodium and if he cont to have problems he will let us know.

## 2015-06-20 NOTE — Telephone Encounter (Signed)
Spoke with pt, per dr Jens Somcrenshaw the pt still requires oxygen therapy. The pt is interested in getting smaller equipment to use outside the home. Discussed with [facility], the pt will need conserving device evaluation. [facility].

## 2015-06-26 ENCOUNTER — Telehealth: Payer: Self-pay | Admitting: Cardiology

## 2015-06-26 DIAGNOSIS — R195 Other fecal abnormalities: Secondary | ICD-10-CM

## 2015-06-26 DIAGNOSIS — Z7901 Long term (current) use of anticoagulants: Secondary | ICD-10-CM

## 2015-06-26 NOTE — Telephone Encounter (Signed)
[facility] at [phone]

## 2015-06-26 NOTE — Telephone Encounter (Signed)
Called pt, he wants to do CBC at primary care.  [phone] [facility] Dialed, closed for lunch. Will call them after 1:30pm.

## 2015-06-26 NOTE — Telephone Encounter (Signed)
Mrs. Lawrence Delgado is calling back to speak to someone about [name] . Please call   Thanks

## 2015-06-26 NOTE — Telephone Encounter (Signed)
Returned call to patient. Pt had called last week regarding dark stools. (see previous note).  He had dark stools last week, this persisted for about 4-5 days, he states now improved. He notes this improved only w/in past day or two. Now stools brown in color.  Denies SOB, fatigue.  Wants to know if he needs to go off Eliquis at this point.   Will route to Dr. Jens Somrenshaw to advise, if hgb check or other labs recommended.

## 2015-06-26 NOTE — Telephone Encounter (Signed)
Pt called in stating that he had some blood in his stool and wanted to know if his blood thinner needed to be adjusted.Please f/u with him  Thanks

## 2015-06-26 NOTE — Telephone Encounter (Signed)
Check cbc Lawrence MillersBrian Maylin [place]

## 2015-06-29 ENCOUNTER — Telehealth: Payer: Self-pay | Admitting: Cardiology

## 2015-06-29 NOTE — Telephone Encounter (Signed)
Had lab work 3 days ago at Dr Haskel Khanynthia Butler's office. Wants to know if you have received the results yet? Please call and let him know.

## 2015-06-29 NOTE — Telephone Encounter (Signed)
RN spoke to patient informed him lab result are  in the office awaiting Dr Jens Somrenshaw review (lab is in box slot for review) Patient aware  Dr Jens Somrenshaw will be in office 06/30/15  Patient states he is eager for results- to see if he needs to anything different - with taking Eliquis. Patient aware someone will call him once reviewed.

## 2015-06-30 NOTE — Telephone Encounter (Signed)
Patient is waiting for lab results °

## 2015-06-30 NOTE — Telephone Encounter (Signed)
Spoke with pt, aware blood work looked fine.

## 2015-07-03 ENCOUNTER — Other Ambulatory Visit: Payer: Self-pay | Admitting: Cardiology

## 2015-07-03 NOTE — Telephone Encounter (Signed)
REFILL 

## 2015-07-06 ENCOUNTER — Telehealth: Payer: Self-pay | Admitting: Cardiology

## 2015-07-06 DIAGNOSIS — R609 Edema, unspecified: Secondary | ICD-10-CM

## 2015-07-06 NOTE — Telephone Encounter (Signed)
Spoke with pt, he is having swelling in his feet and ankles by the end of the day. They are swollen in the morning but not as bad. He is a little SOB and does admit to some problems sleeping. He will increase furosemide to 40 mg bid x 3 days and then will go back to once daily. Will send lab orders to pt for bmp in the first of the week.

## 2015-07-06 NOTE — Telephone Encounter (Signed)
Please call,question about his fluid medicine.

## 2015-09-01 ENCOUNTER — Other Ambulatory Visit: Payer: Self-pay | Admitting: Physician Assistant

## 2015-09-01 NOTE — Telephone Encounter (Signed)
Please review for refill, Thank you. 

## 2015-09-01 NOTE — Telephone Encounter (Signed)
Rx request sent to pharmacy.  

## 2015-09-06 ENCOUNTER — Telehealth: Payer: Self-pay | Admitting: Cardiology

## 2015-09-06 NOTE — Telephone Encounter (Signed)
New message      Pt is on eliquis.  He has a presc for keflex for toothache.  Will it be ok to take keflex with the eliquis?

## 2015-09-06 NOTE — Telephone Encounter (Signed)
Advised OK, understanding verbalized.

## 2015-09-06 NOTE — Telephone Encounter (Signed)
Ok to take keflex with eliquis

## 2015-09-07 NOTE — Progress Notes (Signed)
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 Admitted with new onset atrial fibrillation 10/16. TSH normal. Echocardiogram October 2016 was technically difficult. With Definity ejection fraction was felt to be 20%. Transesophageal echocardiogram October 2016 showed ejection fraction 35-40% and mild mitral regurgitation. Nuclear study 11/16 showed EF 43, prior anterior apical infarct and no ischemia. Since last seen, He denies dyspnea, chest pain, palpitations or syncope.  Current Outpatient Prescriptions  Medication Sig Dispense Refill  . apixaban (ELIQUIS) 5 MG TABS tablet Take 1 tablet (5 mg total) by mouth 2 (two) times daily. 60 tablet 11  . furosemide (LASIX) 40 MG tablet Take 1 tablet (40 mg total) by mouth daily. 30 tablet 1  . HUMALOG MIX 75/25 KWIKPEN (75-25) 100 UNIT/ML Kwikpen     . insulin lispro protamine-lispro (HUMALOG 75/25 MIX) (75-25) 100 UNIT/ML SUSP injection Inject 45 Units into the skin as directed.     . irbesartan (AVAPRO) 75 MG tablet Take 1 tablet (75 mg total) by mouth daily. 30 tablet 5  . LORazepam (ATIVAN) 1 MG tablet Take 1 mg by mouth every 8 (eight) hours as needed for anxiety.     . metFORMIN (GLUCOPHAGE) 500 MG tablet Take 500 mg by mouth 2 (two) times daily with a meal. Take 1000 mg in the morning and 500 mg at night    . metoprolol succinate (TOPROL-XL) 50 MG 24 hr tablet Take 2 tablets (100 mg total) by mouth daily. 60 tablet 11  . nitroGLYCERIN (NITROSTAT) 0.4 MG SL tablet Place 1 tablet (0.4 mg total) under the tongue every 5 (five) minutes as needed. 25 tablet 5  . rosuvastatin (CRESTOR) 40 MG tablet TAKE 1 TABLET DAILY 90 tablet 0  . Vitamin D, Ergocalciferol, (DRISDOL) 50000 UNITS CAPS Take 50,000 Units by mouth 2 (two) times a week. Monday and Thursday     No current facility-administered medications for this visit.     Past Medical History  Diagnosis Date  . CAD (coronary artery  disease)   . GERD (gastroesophageal reflux disease)   . DM (diabetes mellitus) (HCC)   . HLD (hyperlipidemia)   . HTN (hypertension)   . Sleep apnea   . Obesity     Past Surgical History  Procedure Laterality Date  . Coronary stent placement      eluting stent placement in the culpric lesion of the LAD  . Cardiac catheterization      with coronary angiography   . Tee without cardioversion N/A 05/05/2015    Procedure: TRANSESOPHAGEAL ECHOCARDIOGRAM (TEE);  Surgeon: Vesta Mixer, MD;  Location: [facility] ENDOSCOPY;  Service: Cardiovascular;  Laterality: N/A;  . Cardioversion N/A 05/05/2015    Procedure: CARDIOVERSION;  Surgeon: Vesta Mixer, MD;  Location: [facility] ENDOSCOPY;  Service: Cardiovascular;  Laterality: N/A;    Social History   Social History  . Marital Status: Married    Spouse Name: N/A  . Number of Children: N/A  . Years of Education: N/A   Occupational History  . Not on file.   Social History Main Topics  . Smoking status: Former Games developer  . Smokeless tobacco: Not on file  . Alcohol Use: No  . Drug Use: No  . Sexual Activity: Not on file   Other Topics Concern  . Not on file   Social History Narrative    Family History  Problem Relation Age of Onset  . Heart attack Mother   .  Heart attack Father     ROS: no fevers or chills, productive cough, hemoptysis, dysphasia, odynophagia, melena, hematochezia, dysuria, hematuria, rash, seizure activity, orthopnea, PND, pedal edema, claudication. Remaining systems are negative.  Physical Exam: Well-developed obese in no acute distress.  Skin is warm and dry.  HEENT is normal.  Neck is supple.  Chest is clear to auscultation with normal expansion.  Cardiovascular exam is regular rate and rhythm.  Abdominal exam nontender or distended. No masses palpated. Extremities show no edema. neuro grossly intact

## 2015-09-13 ENCOUNTER — Ambulatory Visit (INDEPENDENT_AMBULATORY_CARE_PROVIDER_SITE_OTHER): Payer: Medicare Other | Admitting: Cardiology

## 2015-09-13 ENCOUNTER — Encounter: Payer: Self-pay | Admitting: Cardiology

## 2015-09-13 VITALS — BP 142/84 | HR 71 | Ht 73.0 in | Wt 268.4 lb

## 2015-09-13 DIAGNOSIS — I2510 Atherosclerotic heart disease of native coronary artery without angina pectoris: Secondary | ICD-10-CM | POA: Diagnosis not present

## 2015-09-13 DIAGNOSIS — R0902 Hypoxemia: Secondary | ICD-10-CM | POA: Diagnosis not present

## 2015-09-13 DIAGNOSIS — I5021 Acute systolic (congestive) heart failure: Secondary | ICD-10-CM

## 2015-09-13 DIAGNOSIS — I480 Paroxysmal atrial fibrillation: Secondary | ICD-10-CM

## 2015-09-13 DIAGNOSIS — R0602 Shortness of breath: Secondary | ICD-10-CM

## 2015-09-13 MED ORDER — FUROSEMIDE 40 MG PO TABS: ORAL_TABLET | ORAL | Status: DC

## 2015-09-13 MED ORDER — APIXABAN 5 MG PO TABS: 5.0000 mg | ORAL_TABLET | Freq: Two times a day (BID) | ORAL | Status: AC

## 2015-09-13 MED ORDER — NITROGLYCERIN 0.4 MG SL SUBL: 0.4000 mg | SUBLINGUAL_TABLET | SUBLINGUAL | Status: DC | PRN

## 2015-09-13 MED ORDER — IRBESARTAN 150 MG PO TABS: 150.0000 mg | ORAL_TABLET | Freq: Every day | ORAL | Status: DC

## 2015-09-13 NOTE — Assessment & Plan Note (Signed)
Patient continues on home oxygen since his previous hospitalization. We ambulated him in the office today and his saturations dropped to 81% off of oxygen. We will ask pulmonary to evaluate for sleep apnea and lung disease.

## 2015-09-13 NOTE — Assessment & Plan Note (Signed)
Continue beta blocker. Continue apixaban. Check hemoglobin and renal function. Patient remains in sinus rhythm on examination.

## 2015-09-13 NOTE — Assessment & Plan Note (Signed)
Continue statin.No aspirin given need for anticoagulation. 

## 2015-09-13 NOTE — Assessment & Plan Note (Signed)
Blood pressure borderline. Given reduced LV function I will increase Avapro to 150 mg daily. Continue Toprol.

## 2015-09-13 NOTE — Assessment & Plan Note (Signed)
Continue statin. 

## 2015-09-13 NOTE — Patient Instructions (Signed)
Medication Instructions:   INCREASE IRBESARTAN TO 150 MG ONCE DAILY= 2 OF THE 75 MG TABLETS ONCE DAILY  Labwork:  Your physician recommends that you return for lab work WITH DR Charm Barges  Follow-Up:  Your physician wants you to follow-up in: 6 MONTHS WITH DR Jens Som You will receive a reminder letter in the mail two months in advance. If you don't receive a letter, please call our office to schedule the follow-up appointment.

## 2015-09-13 NOTE — Assessment & Plan Note (Signed)
Patient's volume status is much improvedNow that he is in sinus rhythm. Continue present dose of Lasix. Check potassium and renal function. Continue ARB and beta blocker. We will repeat echocardiogram when he returns in 6 months.

## 2015-09-18 ENCOUNTER — Telehealth: Payer: Self-pay | Admitting: Cardiology

## 2015-09-18 NOTE — Telephone Encounter (Signed)
LM w/Debbie at PCP office to return call

## 2015-09-18 NOTE — Telephone Encounter (Signed)
Please call asap,pt is there waiting. °

## 2015-09-19 NOTE — Telephone Encounter (Signed)
Pt aware lab results pending - I have contacted Dr. Silvana Newness office and requested these be sent to our fax # when ready.

## 2015-09-19 NOTE — Telephone Encounter (Signed)
He wants to know if you received his lab work results from yesterday. He had it at Dr Haskel Khan office.

## 2015-09-21 NOTE — Telephone Encounter (Signed)
Follow up      Calling to see if we have received lab results

## 2015-09-25 ENCOUNTER — Telehealth: Payer: Self-pay | Admitting: Cardiology

## 2015-09-25 NOTE — Telephone Encounter (Signed)
Spoke with pt, aware lab wok looks good.

## 2015-09-25 NOTE — Telephone Encounter (Signed)
Called pt, he is still waiting on interpretation of lab results. Note not received from Dr. Silvana Newness office.  I called Dr. Silvana Newness office - still waiting but they state these were sent - they will resent today to nurse's bullpen fax.

## 2015-09-25 NOTE — Telephone Encounter (Signed)
New message ° ° ° ° ° ° °Calling to get lab results °

## 2015-09-25 NOTE — Telephone Encounter (Signed)
Phone rings w/o answer. Intended to inform pt lab results received and put in box for scanning.  Normal ranged labs w/e of glucose.

## 2015-09-28 ENCOUNTER — Other Ambulatory Visit: Payer: Self-pay | Admitting: Cardiology

## 2015-09-28 NOTE — Telephone Encounter (Signed)
pt aware of results  

## 2015-09-28 NOTE — Telephone Encounter (Signed)
REFILL 

## 2015-10-10 ENCOUNTER — Institutional Professional Consult (permissible substitution): Payer: Medicare Other | Admitting: Pulmonary Disease

## 2016-03-21 ENCOUNTER — Encounter: Payer: Self-pay | Admitting: Cardiology

## 2016-03-25 NOTE — Progress Notes (Signed)
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 Admitted with new onset atrial fibrillation 10/16. TSH normal. Echocardiogram October 2016 was technically difficult. With Definity ejection fraction was felt to be 20%. Transesophageal echocardiogram October 2016 showed ejection fraction 35-40% and mild mitral regurgitation. Nuclear study 11/16 showed EF 43, prior anterior apical infarct and no ischemia. Referred to pulmonary at last OV but did not occur. Since last seen, He has mild dyspnea on exertion but no orthopnea or PND. He denies pedal edema. No chest pain or syncope.  Current Outpatient Prescriptions  Medication Sig Dispense Refill  . apixaban (ELIQUIS) 5 MG TABS tablet Take 1 tablet (5 mg total) by mouth 2 (two) times daily. 180 tablet 3  . furosemide (LASIX) 40 MG tablet Take 1 tablet (40 mg total) by mouth daily. 90 tablet 3  . HUMALOG MIX 75/25 KWIKPEN (75-25) 100 UNIT/ML Kwikpen     . insulin lispro protamine-lispro (HUMALOG 75/25 MIX) (75-25) 100 UNIT/ML SUSP injection Inject 45 Units into the skin as directed.     . irbesartan (AVAPRO) 150 MG tablet Take 1 tablet (150 mg total) by mouth daily. 90 tablet 3  . metFORMIN (GLUCOPHAGE) 500 MG tablet Take 500 mg by mouth 2 (two) times daily with a meal. Take 1000 mg in the morning and 500 mg at night    . metoprolol succinate (TOPROL-XL) 50 MG 24 hr tablet Take 2 tablets (100 mg total) by mouth daily. 60 tablet 11  . nitroGLYCERIN (NITROSTAT) 0.4 MG SL tablet Place 1 tablet (0.4 mg total) under the tongue every 5 (five) minutes as needed. 25 tablet 5  . rosuvastatin (CRESTOR) 40 MG tablet TAKE 1 TABLET DAILY 90 tablet 3  . Vitamin D, Ergocalciferol, (DRISDOL) 50000 UNITS CAPS Take 50,000 Units by mouth 2 (two) times a week. Monday and Thursday     No current facility-administered medications for this visit.      Past Medical History:  Diagnosis Date  . CAD (coronary  artery disease)   . DM (diabetes mellitus) (HCC)   . GERD (gastroesophageal reflux disease)   . HLD (hyperlipidemia)   . HTN (hypertension)   . Obesity   . Sleep apnea     Past Surgical History:  Procedure Laterality Date  . CARDIAC CATHETERIZATION     with coronary angiography   . CARDIOVERSION N/A 05/05/2015   Procedure: CARDIOVERSION;  Surgeon: Vesta Mixer, MD;  Location: [facility] ENDOSCOPY;  Service: Cardiovascular;  Laterality: N/A;  . CORONARY STENT PLACEMENT     eluting stent placement in the culpric lesion of the LAD  . TEE WITHOUT CARDIOVERSION N/A 05/05/2015   Procedure: TRANSESOPHAGEAL ECHOCARDIOGRAM (TEE);  Surgeon: Vesta Mixer, MD;  Location: [facility] ENDOSCOPY;  Service: Cardiovascular;  Laterality: N/A;    Social History   Social History  . Marital status: Married    Spouse name: N/A  . Number of children: N/A  . Years of education: N/A   Occupational History  . Not on file.   Social History Main Topics  . Smoking status: Former Games developer  . Smokeless tobacco: Never Used  . Alcohol use No  . Drug use: No  . Sexual activity: Not on file   Other Topics Concern  . Not on file   Social History Narrative  . No narrative on file    Family History  Problem Relation Age of Onset  . Heart attack Mother   .  Heart attack Father     ROS: no fevers or chills, productive cough, hemoptysis, dysphasia, odynophagia, melena, hematochezia, dysuria, hematuria, rash, seizure activity, orthopnea, PND, pedal edema, claudication. Remaining systems are negative.  Physical Exam: Well-developed obese in no acute distress.  Skin is warm and dry.  HEENT is normal.  Neck is supple.  Chest is clear to auscultation with normal expansion.  Cardiovascular exam is regular rate and rhythm.  Abdominal exam nontender or distended. No masses palpated. Extremities show no edema. neuro grossly intact  ECG Sinus rhythm with frequent PVCs. Normal axis. No ST changes.  A/P  1  Paroxysmal atrial fibrillation-patient remains in sinus rhythm. Continue beta blocker and apixaban.Check hemoglobin and renal function.  2 coronary artery disease-continue statin. No aspirin given need for anticoagulation.  3 hypertension-blood pressure controlled. Continue present medications.  4 hyperlipidemia-continue statin. Check lipids and liver.  5 chronic systolic congestive heart failure-continue present dose of Lasix. Add spironolactone 25 mg daily. Check potassium and renal function in 1 week. We will plan follow-up echocardiograms in the future.  6 dyspnea-he continues to require home oxygen. He likely has obesity hypoventilation syndrome and obstructive sleep apnea. I would like pulmonary to evaluate. He is agreeable.  Olga MillersBrian Alexx Mcburney, MD

## 2016-03-27 ENCOUNTER — Encounter: Payer: Self-pay | Admitting: Cardiology

## 2016-03-27 ENCOUNTER — Ambulatory Visit (INDEPENDENT_AMBULATORY_CARE_PROVIDER_SITE_OTHER): Payer: Medicare Other | Admitting: Cardiology

## 2016-03-27 VITALS — BP 131/78 | HR 78 | Ht 73.0 in | Wt 277.8 lb

## 2016-03-27 DIAGNOSIS — I2510 Atherosclerotic heart disease of native coronary artery without angina pectoris: Secondary | ICD-10-CM

## 2016-03-27 DIAGNOSIS — I10 Essential (primary) hypertension: Secondary | ICD-10-CM

## 2016-03-27 DIAGNOSIS — E785 Hyperlipidemia, unspecified: Secondary | ICD-10-CM

## 2016-03-27 DIAGNOSIS — Z79899 Other long term (current) drug therapy: Secondary | ICD-10-CM

## 2016-03-27 DIAGNOSIS — I480 Paroxysmal atrial fibrillation: Secondary | ICD-10-CM | POA: Diagnosis not present

## 2016-03-27 DIAGNOSIS — R0602 Shortness of breath: Secondary | ICD-10-CM | POA: Diagnosis not present

## 2016-03-27 MED ORDER — SPIRONOLACTONE 25 MG PO TABS: 25.0000 mg | ORAL_TABLET | Freq: Every day | ORAL | 3 refills | Status: DC

## 2016-03-27 MED ORDER — NITROGLYCERIN 0.4 MG SL SUBL: 0.4000 mg | SUBLINGUAL_TABLET | SUBLINGUAL | 5 refills | Status: AC | PRN

## 2016-03-27 NOTE — Patient Instructions (Addendum)
Your physician recommends that you continue on your current medications as directed. Please refer to the Current Medication list given to you today. START SPIROLACTONE  25 MG  EVERY DAY    Your physician recommends that you return for lab work in: WEEK   BMET  CBC   LIPID LIVER  Your physician wants you to follow-up in: 6 MONTHS WITH  DR Jens SomRENSHAW   You will receive a reminder letter in the mail two months in advance. If you don't receive a letter, please call our office to schedule the follow-up appointment.

## 2016-04-03 ENCOUNTER — Ambulatory Visit: Payer: Medicare Other | Admitting: Cardiology

## 2016-04-12 ENCOUNTER — Institutional Professional Consult (permissible substitution): Payer: Medicare Other | Admitting: Pulmonary Disease

## 2016-04-15 ENCOUNTER — Other Ambulatory Visit: Payer: Self-pay | Admitting: Physician Assistant

## 2016-04-15 NOTE — Telephone Encounter (Signed)
Review for refill. 

## 2016-04-23 ENCOUNTER — Institutional Professional Consult (permissible substitution): Payer: Medicare Other | Admitting: Pulmonary Disease

## 2016-05-06 ENCOUNTER — Encounter: Payer: Self-pay | Admitting: *Deleted

## 2016-05-06 ENCOUNTER — Other Ambulatory Visit: Payer: Self-pay | Admitting: *Deleted

## 2016-05-06 DIAGNOSIS — I4891 Unspecified atrial fibrillation: Secondary | ICD-10-CM

## 2016-05-06 DIAGNOSIS — E785 Hyperlipidemia, unspecified: Secondary | ICD-10-CM

## 2016-05-13 ENCOUNTER — Other Ambulatory Visit: Payer: Self-pay | Admitting: Physician Assistant

## 2016-05-13 NOTE — Telephone Encounter (Signed)
Please review for refill. Thanks!  

## 2016-05-22 ENCOUNTER — Institutional Professional Consult (permissible substitution): Payer: Medicare Other | Admitting: Pulmonary Disease

## 2016-06-10 ENCOUNTER — Other Ambulatory Visit: Payer: Self-pay | Admitting: Physician Assistant

## 2016-06-11 ENCOUNTER — Institutional Professional Consult (permissible substitution): Payer: Medicare Other | Admitting: Pulmonary Disease

## 2016-07-09 ENCOUNTER — Institutional Professional Consult (permissible substitution): Payer: Medicare Other | Admitting: Pulmonary Disease

## 2016-07-12 ENCOUNTER — Other Ambulatory Visit: Payer: Self-pay | Admitting: Physician Assistant

## 2016-07-12 NOTE — Telephone Encounter (Signed)
Review for refill. 

## 2016-07-23 ENCOUNTER — Telehealth: Payer: Self-pay | Admitting: Cardiology

## 2016-07-23 NOTE — Telephone Encounter (Signed)
Follow up   Patient had blood work done with primary, and needing to know if he needs to continue taking medication based off lab work results.  spironolactone (ALDACTONE) 25 MG tablet(Expired)

## 2016-07-24 NOTE — Telephone Encounter (Signed)
Spoke with pt, labs from primary care scanned into chart. Kidney function and potassium are all normal. No change in medications at this time.

## 2016-08-15 ENCOUNTER — Institutional Professional Consult (permissible substitution): Payer: Medicare Other | Admitting: Pulmonary Disease

## 2016-08-26 ENCOUNTER — Other Ambulatory Visit: Payer: Self-pay | Admitting: Cardiology

## 2016-08-26 DIAGNOSIS — I2510 Atherosclerotic heart disease of native coronary artery without angina pectoris: Secondary | ICD-10-CM

## 2016-09-05 ENCOUNTER — Encounter: Payer: Self-pay | Admitting: Pulmonary Disease

## 2016-09-05 ENCOUNTER — Ambulatory Visit (INDEPENDENT_AMBULATORY_CARE_PROVIDER_SITE_OTHER): Payer: Medicare Other | Admitting: Pulmonary Disease

## 2016-09-05 VITALS — BP 136/74 | HR 68 | Ht 73.0 in | Wt 283.6 lb

## 2016-09-05 DIAGNOSIS — R0602 Shortness of breath: Secondary | ICD-10-CM | POA: Diagnosis not present

## 2016-09-05 DIAGNOSIS — G4733 Obstructive sleep apnea (adult) (pediatric): Secondary | ICD-10-CM

## 2016-09-05 NOTE — Patient Instructions (Signed)
We will check her oxygen levels at rest and on ambuliation without oxygen We'll schedule for pulmonary function test and a home sleep study We'll get on Symbicort 80/4.5  Return to clinic in 3 months.

## 2016-09-05 NOTE — Progress Notes (Signed)
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 He has been referred for evaluation of chronic dyspnea on exertion, wheezing. He does not have dyspnea at rest. He denies any cough, sputum production, chest pain, palpitations, fevers, chills.  He was hospitalized in October 2015 for new onset A. fib and has been on 4 L oxygen 24/7 since then. He would like to get off the oxygen. There is also question of sleep apnea, obesity hypoventilation syndrome. He feels that he does not have any problems with sleep but is willing to undergo a sleep study.  He has about 90-pack-year smoking history. He quit in 2005. He is currently disabled with no known exposures.  Outpatient Encounter Prescriptions as of 09/05/2016  Medication Sig  . apixaban (ELIQUIS) 5 MG TABS tablet Take 1 tablet (5 mg total) by mouth 2 (two) times daily.  . furosemide (LASIX) 40 MG tablet Take 1 tablet (40 mg total) by mouth daily.  Marland Kitchen. HUMALOG MIX 75/25 KWIKPEN (75-25) 100 UNIT/ML Kwikpen   . insulin lispro protamine-lispro (HUMALOG 75/25 MIX) (75-25) 100 UNIT/ML SUSP injection Inject 45 Units into the skin as directed.   . irbesartan (AVAPRO) 150 MG tablet TAKE 1 TABLET DAILY  . metFORMIN (GLUCOPHAGE) 500 MG tablet Take 500 mg by mouth 2 (two) times daily with a meal. Take 1000 mg in the morning and 500 mg at night  . metoprolol succinate (TOPROL-XL) 50 MG 24 hr tablet TAKE (2) TABLETS DAILY  . nitroGLYCERIN (NITROSTAT) 0.4 MG SL tablet Place 1 tablet (0.4 mg total) under the tongue every 5 (five) minutes as needed.  . rosuvastatin (CRESTOR) 40 MG tablet TAKE 1 TABLET DAILY  . Vitamin D, Ergocalciferol,  (DRISDOL) 50000 UNITS CAPS Take 50,000 Units by mouth 2 (two) times a week. Monday and Thursday  . spironolactone (ALDACTONE) 25 MG tablet Take 1 tablet (25 mg total) by mouth daily.   No facility-administered encounter medications on file as of 09/05/2016.     Allergies as of 09/05/2016 - Review Complete 09/05/2016  Allergen Reaction Noted  . Codeine    . Clopidogrel bisulfate Itching and Rash     Past Medical History:  Diagnosis Date  . CAD (coronary artery disease)   . DM (diabetes mellitus) (HCC)   . GERD (gastroesophageal reflux disease)   . HLD (hyperlipidemia)   . HTN (hypertension)   . MI (myocardial infarction)   . Obesity   . Sleep apnea     Past Surgical History:  Procedure Laterality Date  . CARDIAC CATHETERIZATION     with coronary angiography   . CARDIOVERSION N/A 05/05/2015   Procedure: CARDIOVERSION;  Surgeon: Vesta MixerPhilip J Nahser, MD;  [facility];  Service: Cardiovascular;  Laterality: N/A;  . CORONARY STENT PLACEMENT     eluting stent placement in the culpric lesion of the LAD  . TEE WITHOUT CARDIOVERSION N/A 05/05/2015   Procedure: TRANSESOPHAGEAL ECHOCARDIOGRAM (TEE);  Surgeon: Vesta MixerPhilip J Nahser, MD;  [facility];  Service: Cardiovascular;  Laterality: N/A;    Family History  Problem Relation Age of Onset  . Heart attack Mother   . Heart attack Father     Social  History   Social History  . Marital status: Married    Spouse name: N/A  . Number of children: N/A  . Years of education: N/A   Occupational History  . Not on file.   Social History Main Topics  . Smoking status: Former Smoker    Packs/day: 3.00    Years: 25.00  . Smokeless tobacco: Never Used     Comment: quit smoking in 2005  . Alcohol use No  . Drug use: No  . Sexual activity: Not on file   Other Topics Concern  . Not on file   Social History Narrative  . No narrative on file    Review of systems: Review of Systems  Constitutional: Negative for fever  and chills.  HENT: Negative.   Eyes: Negative for blurred vision.  Respiratory: as per HPI  Cardiovascular: Negative for chest pain and palpitations.  Gastrointestinal: Negative for vomiting, diarrhea, blood per rectum. Genitourinary: Negative for dysuria, urgency, frequency and hematuria.  Musculoskeletal: Negative for myalgias, back pain and joint pain.  Skin: Negative for itching and rash.  Neurological: Negative for dizziness, tremors, focal weakness, seizures and loss of consciousness.  Endo/Heme/Allergies: Negative for environmental allergies.  Psychiatric/Behavioral: Negative for depression, suicidal ideas and hallucinations.  All other systems reviewed and are negative.  Physical Exam: Blood pressure 136/74, pulse 68, height 6\' 1"  (1.854 m), weight 128.6 kg (283 lb 9.6 oz), SpO2 90 %. Gen:      No acute distress HEENT:  EOMI, sclera anicteric Neck:     No masses; no thyromegaly Lungs:    Clear to auscultation bilaterally; normal respiratory effort CV:         Regular rate and rhythm; no murmurs Abd:      + bowel sounds; soft, non-tender; no palpable masses, no distension Ext:    No edema; adequate peripheral perfusion Skin:      Warm and dry; no rash Neuro: alert and oriented x 3 Psych: normal mood and affect  Data Reviewed: Chest x-ray 05/05/15- Cardiomegaly with pulmonary edema, small bilateral effusions Chest x-ray 05/07/15-cardiomegaly with improvement in pulmonary edema. No pleural effusions All images reviewed  Echo 05/03/15- Technically difficult study - definity contrast given. There is   severe global hypokinesis, LVEF 20%. This is a marked reduction   in function compared to the prior study in 2015.  Assessment:  Consult for evaluation of dyspnea, wheezing Symptoms are likely from his multiple cardiac issues and heart failure. He probably has underlying COPD given his extensive smoking history. I'll evaluate by getting pulmonary function tests and give him  Symbicort 80/4.5. He'll return in a couple of months to evaluate response to therapy and to review the lung function tests  He is on chronic oxygen at home and would like to get off. I doubt he would be able get off any time soon as his sats were 90% at rest and 85% on ambulation in office today.   Suspected OSA/OHA He'll need a sleep study and prefers it to be done at home. We will order a home test.  Plan/Recommendations: - Continue home O2 - Order PFTs and home sleep study - Start Symbicort 80/4.5  Chilton Greathouse MD [facility] Pager [phone] 09/05/2016, 9:52 AM  CC: Samuel Jester, DO

## 2016-09-06 ENCOUNTER — Telehealth: Payer: Self-pay | Admitting: Pulmonary Disease

## 2016-09-06 ENCOUNTER — Other Ambulatory Visit: Payer: Self-pay | Admitting: Cardiology

## 2016-09-06 MED ORDER — BUDESONIDE-FORMOTEROL FUMARATE 80-4.5 MCG/ACT IN AERO: 2.0000 | INHALATION_SPRAY | Freq: Two times a day (BID) | RESPIRATORY_TRACT | 12 refills | Status: DC

## 2016-09-06 NOTE — Telephone Encounter (Signed)
Called and spoke with pt and he is aware of rx for the symbicort that has been sent to the pharmacy. Nothing further is needed.

## 2016-09-16 ENCOUNTER — Encounter: Payer: Self-pay | Admitting: Cardiology

## 2016-09-16 ENCOUNTER — Telehealth: Payer: Self-pay | Admitting: Cardiology

## 2016-09-16 NOTE — Telephone Encounter (Signed)
Would recommend scheduling follow-up appointment with Dr. Jens Somrenshaw when he returns if he is having recurrent a-fib that is symptomatic, may need antiarrythmic therapy.   Dr. HRexene Edison

## 2016-09-16 NOTE — Telephone Encounter (Signed)
This encounter was created in error - please disregard.

## 2016-09-16 NOTE — Telephone Encounter (Signed)
Pt of Dr. Jens Somrenshaw Hx A Fib, CHF, HTN  Returned call to patient. States over weekend, his A fib was "messing up" He called 911, ambulance came to his house Sat night a fib 130-160 when it happened. He refused transport to local hospital.  Wynelle LinkSun, "a little worse", but he didn't call EMS. Denies worsening SOB at time of event (chronically O2 dependent), denies chest pain, fatigue, etc.  ? Symptom duration: " i don't know"  Explains that it happened when he laid a certain way going back to taking a nap on the couch. Episode "lasted a pretty good while" and resolved same evening.  Notes he took a "nerve pill" and this seemed to have helped.  Voices "right now I feel okay".  Taking "2 pills of toprol" (total 100mg  daily)  Pt voiced if Dr. Jens Somrenshaw advises med changes or OV, let him know. Pt preferred provider recommended suggestions only. Pt voiced he has limited transportation so would need to be called to discuss possible times to come in.  Dr. Jens Somrenshaw out of office. Routed to [employer] for advice.

## 2016-09-16 NOTE — Telephone Encounter (Signed)
Left msg for pt w recommendations (detailed msg OK per DPR) & that I'm reaching out to schedulers to assist w this.

## 2016-09-16 NOTE — Telephone Encounter (Signed)
New Message  Pt voiced had to call EMS this week and calling about afib.  Please f/u

## 2016-09-16 NOTE — Telephone Encounter (Signed)
New Message  Pt call requesting to speak with RN. Pt states the Ambulance was call Saturday and Sunday for pt Afib. Pt would like to discuss. Please call back

## 2016-09-18 ENCOUNTER — Encounter (HOSPITAL_COMMUNITY): Payer: Self-pay | Admitting: *Deleted

## 2016-09-18 ENCOUNTER — Ambulatory Visit (INDEPENDENT_AMBULATORY_CARE_PROVIDER_SITE_OTHER): Payer: Medicare Other | Admitting: Cardiology

## 2016-09-18 ENCOUNTER — Emergency Department (HOSPITAL_COMMUNITY)
Admission: EM | Admit: 2016-09-18 | Discharge: 2016-09-18 | Disposition: A | Discharge status: Home / Self Care | Payer: Medicare Other | Attending: Emergency Medicine | Admitting: Emergency Medicine

## 2016-09-18 ENCOUNTER — Emergency Department (HOSPITAL_COMMUNITY): Payer: Medicare Other

## 2016-09-18 ENCOUNTER — Encounter: Payer: Self-pay | Admitting: Cardiology

## 2016-09-18 VITALS — BP 110/72 | HR 162 | Ht 73.0 in | Wt 278.0 lb

## 2016-09-18 DIAGNOSIS — I110 Hypertensive heart disease with heart failure: Secondary | ICD-10-CM | POA: Diagnosis not present

## 2016-09-18 DIAGNOSIS — I482 Chronic atrial fibrillation, unspecified: Secondary | ICD-10-CM

## 2016-09-18 DIAGNOSIS — I2510 Atherosclerotic heart disease of native coronary artery without angina pectoris: Secondary | ICD-10-CM | POA: Diagnosis not present

## 2016-09-18 DIAGNOSIS — Z7901 Long term (current) use of anticoagulants: Secondary | ICD-10-CM | POA: Insufficient documentation

## 2016-09-18 DIAGNOSIS — Z87891 Personal history of nicotine dependence: Secondary | ICD-10-CM | POA: Insufficient documentation

## 2016-09-18 DIAGNOSIS — E119 Type 2 diabetes mellitus without complications: Secondary | ICD-10-CM | POA: Insufficient documentation

## 2016-09-18 DIAGNOSIS — I5021 Acute systolic (congestive) heart failure: Secondary | ICD-10-CM | POA: Insufficient documentation

## 2016-09-18 DIAGNOSIS — I10 Essential (primary) hypertension: Secondary | ICD-10-CM

## 2016-09-18 DIAGNOSIS — Z794 Long term (current) use of insulin: Secondary | ICD-10-CM | POA: Insufficient documentation

## 2016-09-18 DIAGNOSIS — Z79899 Other long term (current) drug therapy: Secondary | ICD-10-CM | POA: Diagnosis not present

## 2016-09-18 DIAGNOSIS — I4891 Unspecified atrial fibrillation: Secondary | ICD-10-CM | POA: Diagnosis not present

## 2016-09-18 DIAGNOSIS — Z955 Presence of coronary angioplasty implant and graft: Secondary | ICD-10-CM | POA: Diagnosis not present

## 2016-09-18 DIAGNOSIS — I252 Old myocardial infarction: Secondary | ICD-10-CM | POA: Insufficient documentation

## 2016-09-18 LAB — CBC
HCT: 42 % (ref 39.0–52.0)
Hemoglobin: 15.3 g/dL (ref 13.0–17.0)
MCH: 30.4 pg (ref 26.0–34.0)
MCHC: 36.4 g/dL — ABNORMAL HIGH (ref 30.0–36.0)
MCV: 83.5 fL (ref 78.0–100.0)
PLATELETS: 228 10*3/uL (ref 150–400)
RBC: 5.03 MIL/uL (ref 4.22–5.81)
RDW: 12.5 % (ref 11.5–15.5)
WBC: 8.8 10*3/uL (ref 4.0–10.5)

## 2016-09-18 LAB — BASIC METABOLIC PANEL
Anion gap: 13 (ref 5–15)
BUN: 13 mg/dL (ref 6–20)
CALCIUM: 9.6 mg/dL (ref 8.9–10.3)
CO2: 29 mmol/L (ref 22–32)
CREATININE: 1.29 mg/dL — AB (ref 0.61–1.24)
Chloride: 93 mmol/L — ABNORMAL LOW (ref 101–111)
GFR calc Af Amer: >60 mL/min (ref >60)
GFR, EST NON AFRICAN AMERICAN: 59 mL/min — AB (ref >60)
Glucose, Bld: 405 mg/dL — ABNORMAL HIGH (ref 65–99)
Potassium: 2.9 mmol/L — ABNORMAL LOW (ref 3.5–5.1)
SODIUM: 135 mmol/L (ref 135–145)

## 2016-09-18 LAB — CBG MONITORING, ED: Glucose-Capillary: 375 mg/dL — ABNORMAL HIGH (ref 65–99)

## 2016-09-18 MED ORDER — PROPOFOL BOLUS VIA INFUSION: 50.0000 mg | Freq: Once | INTRAVENOUS | Status: DC

## 2016-09-18 MED ORDER — FENTANYL CITRATE (PF) 100 MCG/2ML IJ SOLN
50.0000 ug | Freq: Once | INTRAMUSCULAR | Status: AC
  Administered 2016-09-18: 50 ug via INTRAVENOUS
  Filled 2016-09-18: qty 2

## 2016-09-18 MED ORDER — POTASSIUM CHLORIDE CRYS ER 20 MEQ PO TBCR
40.0000 meq | EXTENDED_RELEASE_TABLET | Freq: Once | ORAL | Status: AC
  Administered 2016-09-18: 40 meq via ORAL
  Filled 2016-09-18: qty 2

## 2016-09-18 MED ORDER — PROPOFOL 10 MG/ML IV BOLUS
50.0000 mg | Freq: Once | INTRAVENOUS | Status: AC
  Administered 2016-09-18: 50 mg via INTRAVENOUS
  Filled 2016-09-18: qty 20

## 2016-09-18 MED ORDER — MIDAZOLAM HCL 2 MG/2ML IJ SOLN
2.0000 mg | Freq: Once | INTRAMUSCULAR | Status: AC
  Administered 2016-09-18: 2 mg via INTRAVENOUS
  Filled 2016-09-18: qty 2

## 2016-09-18 NOTE — Discharge Instructions (Signed)
Follow-up with atrial fibrillation clinic.  Return here as needed

## 2016-09-18 NOTE — Progress Notes (Signed)
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 Admitted with new onset atrial fibrillation 10/16. TSH normal. Echocardiogram October 2016 was technically difficult. With Definity ejection fraction was felt to be 20%. Transesophageal echocardiogram October 2016 showed ejection fraction 35-40% and mild mitral regurgitation. Nuclear study 11/16 showed EF 43, prior anterior apical infarct and no ischemia. Contacted office recently with complaints of recurrent atrial fibrillation. Since last seen, patient denies increased dyspnea, chest pain, palpitations, syncope, bleeding or pedal edema. He has been compliant with apixaban.   Current Outpatient Prescriptions  Medication Sig Dispense Refill  . apixaban (ELIQUIS) 5 MG TABS tablet Take 1 tablet (5 mg total) by mouth 2 (two) times daily. 180 tablet 3  . budesonide-formoterol (SYMBICORT) 80-4.5 MCG/ACT inhaler Inhale 2 puffs into the lungs 2 (two) times daily. 1 Inhaler 12  . furosemide (LASIX) 40 MG tablet Take 1 tablet (40 mg total) by mouth daily. 90 tablet 3  . HUMALOG MIX 75/25 KWIKPEN (75-25) 100 UNIT/ML Kwikpen     . insulin lispro protamine-lispro (HUMALOG 75/25 MIX) (75-25) 100 UNIT/ML SUSP injection Inject 45 Units into the skin as directed.     . irbesartan (AVAPRO) 150 MG tablet TAKE 1 TABLET DAILY 90 tablet 1  . LORazepam (ATIVAN) 1 MG tablet Take 1 mg by mouth 2 (two) times daily.    . metFORMIN (GLUCOPHAGE) 500 MG tablet Take 500 mg by mouth 2 (two) times daily with a meal. Take 1000 mg in the morning and 500 mg at night    . metoprolol succinate (TOPROL-XL) 50 MG 24 hr tablet TAKE (2) TABLETS DAILY 180 tablet 1  . rosuvastatin (CRESTOR) 40 MG tablet TAKE 1 TABLET DAILY 90 tablet 1  . Vitamin D, Ergocalciferol, (DRISDOL) 50000 UNITS CAPS Take 50,000 Units by mouth 2 (two) times a week. Monday and Thursday    . nitroGLYCERIN (NITROSTAT) 0.4 MG SL tablet  Place 1 tablet (0.4 mg total) under the tongue every 5 (five) minutes as needed. 25 tablet 5   No current facility-administered medications for this visit.      Past Medical History:  Diagnosis Date  . CAD (coronary artery disease)   . DM (diabetes mellitus) (HCC)   . GERD (gastroesophageal reflux disease)   . HLD (hyperlipidemia)   . HTN (hypertension)   . MI (myocardial infarction)   . Obesity   . Sleep apnea     Past Surgical History:  Procedure Laterality Date  . CARDIAC CATHETERIZATION     with coronary angiography   . CARDIOVERSION N/A 05/05/2015   Procedure: CARDIOVERSION;  Surgeon: Vesta Mixer, MD;  Location: [facility] ENDOSCOPY;  Service: Cardiovascular;  Laterality: N/A;  . CORONARY STENT PLACEMENT     eluting stent placement in the culpric lesion of the LAD  . TEE WITHOUT CARDIOVERSION N/A 05/05/2015   Procedure: TRANSESOPHAGEAL ECHOCARDIOGRAM (TEE);  Surgeon: Vesta Mixer, MD;  Location: [facility] ENDOSCOPY;  Service: Cardiovascular;  Laterality: N/A;    Social History   Social History  . Marital status: Married    Spouse name: N/A  . Number of children: N/A  . Years of education: N/A   Occupational History  . Not on file.   Social History Main Topics  . Smoking status: Former Smoker    Packs/day: 3.00    Years: 25.00  . Smokeless tobacco: Never Used     Comment: quit smoking in 2005  .  Alcohol use No  . Drug use: No  . Sexual activity: Not on file   Other Topics Concern  . Not on file   Social History Narrative  . No narrative on file    Family History  Problem Relation Age of Onset  . Heart attack Mother   . Heart attack Father     ROS: no fevers or chills, productive cough, hemoptysis, dysphasia, odynophagia, melena, hematochezia, dysuria, hematuria, rash, seizure activity, orthopnea, PND, pedal edema, claudication. Remaining systems are negative.  Physical Exam: Well-developed obese in no acute distress.  Skin is warm and dry.  HEENT is  normal.  Neck is supple.  Chest is clear to auscultation with normal expansion.  Cardiovascular exam is tachycardic and irregular Abdominal exam nontender or distended. No masses palpated. Extremities show no edema. neuro grossly intact  ECG-Atrial fibrillation at a rate of 163. Normal axis. Nonspecific ST changes.  A/P  1 Paroxysmal atrial fibrillation-continue apixaban. Continue beta blocker. He has developed recurrent atrial fibrillation over the past 72 hours. His heart rate is 163. He is not particularly symptomatic. I think the best course of action would be to proceed with cardioversion and I have contacted the emergency room was agreeable to performing the procedure. He has been compliant with apixaban. I will then refer him to atrial fibrillation clinic for consideration of antiarrhythmic. Best option may be tikosyn. I will see him back 4-6 weeks later.  2 coronary artery disease-continue statin. No aspirin given need for anticoagulation.  3 hypertension-blood pressure controlled. Continue present medications.  4 hyperlipidemia-continue statin. Lipids and liver monitored by primary care.   5 chronic combined systolic/diastolic congestive heart failure-continue present dose of diuretics.  6 dyspnea-likely multifactorial. There appears to be a component of congestive heart failure but also obesity hypoventilation syndrome and obstructive sleep apnea. Now followed by pulmonary.  7 ICM-continue ARB and beta blocker.    Olga MillersBrian Keylah Darwish, MD

## 2016-09-18 NOTE — ED Triage Notes (Signed)
Pt reports being sent by dr., Jens Somrenshaw for afib. Pt denies pain. Pt states that this began over the weekend. Pt reports "fluttering sensation in his chest.

## 2016-09-18 NOTE — ED Notes (Signed)
Patient awake taking with family

## 2016-09-18 NOTE — ED Notes (Signed)
Patient is fully awake and alert oriented x 4 . Family remains at bedside.

## 2016-09-18 NOTE — ED Notes (Signed)
States he felt a flutter in his chest  On Sunday and called [facility]  Decided not to come to the ED on Sunday called and made an appointment with Dr. Jens Somrenshaw, was seen in the office today and found to be in A-Fib. Denies chest pain, states he is on home 02 at 4l all the time. Alert and oriented. Skin w/d color good. Patient connected to cardiac monitor and zoll pads. Family currently at bedside.

## 2016-09-18 NOTE — ED Provider Notes (Signed)
[facility]-EMERGENCY DEPT Provider Note   CSN: [id number] Arrival date & time: 09/18/16  1139     History   Chief Complaint Chief Complaint  Patient presents with  . Atrial Fibrillation    HPI Lawrence Delgado is a 61 y.o. male.  HPI Patient presents to the emergency department with atrial fibrillation from his cardiologist's office, the cardiologist, called [place] and asked if we could cardiovert the patient.  The patient states that he has had this sensation over the last couple of days. The patient denies chest pain, shortness of breath, headache,blurred vision, neck pain, fever, cough, weakness, numbness, dizziness, anorexia, edema, abdominal pain, nausea, vomiting, diarrhea, rash, back pain, dysuria, hematemesis, bloody stool, near syncope, or syncope. Past Medical History:  Diagnosis Date  . CAD (coronary artery disease)   . DM (diabetes mellitus) (HCC)   . GERD (gastroesophageal reflux disease)   . HLD (hyperlipidemia)   . HTN (hypertension)   . MI (myocardial infarction)   . Obesity   . Sleep apnea     Patient Active Problem List   Diagnosis Date Noted  . Dyspnea and respiratory abnormalities   . Hypoxia   . Pleural effusion   . Acute systolic CHF (congestive heart failure) (HCC) 05/04/2015  . Atrial fibrillation (HCC) 05/02/2015  . Atrial fibrillation with RVR (HCC)   . CHEST PAIN 05/04/2010  . OBESITY 12/22/2008  . Sleep apnea 12/22/2008  . EDEMA 12/22/2008  . DYSPNEA 12/22/2008  . Type II diabetes mellitus (HCC) 06/03/2008  . Hyperlipidemia 06/03/2008  . Essential hypertension 06/03/2008  . Coronary atherosclerosis 06/03/2008  . GERD 06/03/2008    Past Surgical History:  Procedure Laterality Date  . CARDIAC CATHETERIZATION     with coronary angiography   . CARDIOVERSION N/A 05/05/2015   Procedure: CARDIOVERSION;  Surgeon: Vesta Mixer, MD;  Location: [facility] ENDOSCOPY;  Service: Cardiovascular;  Laterality: N/A;  . CORONARY STENT PLACEMENT     eluting stent  placement in the culpric lesion of the LAD  . TEE WITHOUT CARDIOVERSION N/A 05/05/2015   Procedure: TRANSESOPHAGEAL ECHOCARDIOGRAM (TEE);  Surgeon: Vesta Mixer, MD;  Location: [facility] ENDOSCOPY;  Service: Cardiovascular;  Laterality: N/A;       Home Medications    Prior to Admission medications   Medication Sig Start Date End Date Taking? Authorizing Provider  apixaban (ELIQUIS) 5 MG TABS tablet Take 1 tablet (5 mg total) by mouth 2 (two) times daily. 09/13/15  Yes Lewayne Bunting, MD  budesonide-formoterol (SYMBICORT) 80-4.5 MCG/ACT inhaler Inhale 2 puffs into the lungs 2 (two) times daily. 09/06/16  Yes Praveen Mannam, MD  furosemide (LASIX) 40 MG tablet Take 1 tablet (40 mg total) by mouth daily. 09/13/15  Yes Lewayne Bunting, MD  insulin lispro (HUMALOG) 100 UNIT/ML injection Inject 7-17 Units into the skin 3 (three) times daily before meals. Sliding scale   Yes Historical Provider, MD  insulin lispro protamine-lispro (HUMALOG 75/25 MIX) (75-25) 100 UNIT/ML SUSP injection Inject 50 Units into the skin 2 (two) times daily with a meal.    Yes Historical Provider, MD  irbesartan (AVAPRO) 150 MG tablet TAKE 1 TABLET DAILY 08/26/16  Yes Lewayne Bunting, MD  LORazepam (ATIVAN) 1 MG tablet Take 1 mg by mouth 2 (two) times daily.   Yes Historical Provider, MD  metFORMIN (GLUCOPHAGE) 500 MG tablet Take 500 mg by mouth 2 (two) times daily with a meal.    Yes Historical Provider, MD  metoprolol succinate (TOPROL-XL) 50 MG 24 hr tablet TAKE (  2) TABLETS DAILY 07/12/16  Yes Lewayne BuntingBrian S Crenshaw, MD  nitroGLYCERIN (NITROSTAT) 0.4 MG SL tablet Place 1 tablet (0.4 mg total) under the tongue every 5 (five) minutes as needed. 03/27/16  Yes Lewayne BuntingBrian S Crenshaw, MD  rosuvastatin (CRESTOR) 40 MG tablet TAKE 1 TABLET DAILY 09/06/16  Yes Lewayne BuntingBrian S Crenshaw, MD  Vitamin D, Ergocalciferol, (DRISDOL) 50000 UNITS CAPS Take 50,000 Units by mouth 2 (two) times a week. Monday and Thursday   Yes Historical Provider, MD    Family  History Family History  Problem Relation Age of Onset  . Heart attack Mother   . Heart attack Father     Social History Social History  Substance Use Topics  . Smoking status: Former Smoker    Packs/day: 3.00    Years: 25.00  . Smokeless tobacco: Never Used     Comment: quit smoking in 2005  . Alcohol use No     Allergies   Codeine and Clopidogrel bisulfate   Review of Systems Review of Systems All other systems negative except as documented in the HPI. All pertinent positives and negatives as reviewed in the HPI.  Physical Exam Updated Vital Signs BP 117/68 (BP Location: Left Arm)   Pulse 82   Temp 97.7 F (36.5 C) (Oral)   Resp (!) 2   SpO2 99%   Physical Exam  Constitutional: He is oriented to person, place, and time. He appears well-developed and well-nourished. No distress.  HENT:  Head: Normocephalic and atraumatic.  Mouth/Throat: Oropharynx is clear and moist.  Eyes: Pupils are equal, round, and reactive to light.  Neck: Normal range of motion. Neck supple.  Cardiovascular: Normal heart sounds and normal pulses.  An irregularly irregular rhythm present. Tachycardia present.  PMI is not displaced.  Exam reveals no gallop and no friction rub.   No murmur heard. Pulmonary/Chest: Effort normal and breath sounds normal. No respiratory distress. He has no wheezes.  Abdominal: Soft. Bowel sounds are normal. He exhibits no distension. There is no tenderness.  Neurological: He is alert and oriented to person, place, and time. He exhibits normal muscle tone. Coordination normal.  Skin: Skin is warm and dry. No rash noted. No erythema.  Psychiatric: He has a normal mood and affect. His behavior is normal.  Nursing note and vitals reviewed.    ED Treatments / Results  Labs (all labs ordered are listed, but only abnormal results are displayed) Labs Reviewed  BASIC METABOLIC PANEL - Abnormal; Notable for the following:       Result Value   Potassium 2.9 (*)     Chloride 93 (*)    Glucose, Bld 405 (*)    Creatinine, Ser 1.29 (*)    GFR calc non Af Amer 59 (*)    All other components within normal limits  CBC - Abnormal; Notable for the following:    MCHC 36.4 (*)    All other components within normal limits  CBG MONITORING, ED - Abnormal; Notable for the following:    Glucose-Capillary 375 (*)    All other components within normal limits    EKG  EKG Interpretation None       Radiology Dg Chest Port 1 View  Result Date: 09/18/2016 CLINICAL DATA:  Atrial fibrillation. EXAM: PORTABLE CHEST 1 VIEW COMPARISON:  05/07/2015 FINDINGS: Cardiac pad overlying the left lower chest. Prominent interstitial markings probably represent interstitial edema. Cardiac silhouette is enlarged but similar to the previous examination. Patient is also rotated towards the right and limited evaluation  of the right paratracheal region. Negative for pneumothorax. IMPRESSION: Stable cardiomegaly. Prominent lung markings are suggestive for interstitial edema. Electronically Signed   By: Richarda Overlie M.D.   On: 09/18/2016 12:43    Procedures Procedures (including critical care time)  Medications Ordered in ED Medications  midazolam (VERSED) injection 2 mg (2 mg Intravenous Given 09/18/16 1413)  fentaNYL (SUBLIMAZE) injection 50 mcg (50 mcg Intravenous Given 09/18/16 1416)  propofol (DIPRIVAN) 10 mg/mL bolus/IV push 50 mg (50 mg Intravenous Given 09/18/16 1416)  potassium chloride SA (K-DUR,KLOR-CON) CR tablet 40 mEq (40 mEq Oral Given 09/18/16 1503)     Initial Impression / Assessment and Plan / ED Course  I have reviewed the triage vital signs and the nursing notes.  Pertinent labs & imaging results that were available during my care of the patient were reviewed by me and considered in my medical decision making (see chart for details).     Patient was cardioverted here in the emergency department did take 2 different defibrillations.  I rechecked him at 3 PM and was  in a rate controlled atrial fibrillation.  Patient will be referred back to atrial fibrillation clinic  Final Clinical Impressions(s) / ED Diagnoses   Final diagnoses:  None    New Prescriptions New Prescriptions   No medications on file     Charlestine Night, PA-C 09/18/16 1508    Eber Hong, MD 09/19/16 [phone]

## 2016-09-18 NOTE — ED Notes (Signed)
Dr Hyacinth MeekerMiller at bedside patient asleep  Cardioverted at 120 per Dr Hyacinth MeekerMIller , Rhythm  Remained a-fib, Dr. Hyacinth MeekerMiller cardioverted at 120 second time rhythm converted to NSR rate 86  Patient remains sleepy however will arouse to verbal stimuli. Family at bedside.

## 2016-09-18 NOTE — ED Provider Notes (Signed)
  Physical Exam  BP 139/84 (BP Location: Right Arm)   Pulse 81   Temp 97.7 F (36.5 C) (Oral)   Resp 16   SpO2 99%   Physical Exam  ED Course  .Cardioversion Date/Time: 09/18/2016 2:26 PM Performed by: Lawrence HongMILLER, Lawrence Delgado Authorized by: Lawrence HongMILLER, Lawrence Delgado   Consent:    Consent obtained:  Written   Consent given by:  Patient   Risks discussed:  Cutaneous burn, induced arrhythmia, pain and death   Alternatives discussed:  Rate-control medication and alternative treatment Pre-procedure details:    Cardioversion basis:  Emergent   Rhythm:  Atrial fibrillation   Electrode placement:  Anterior-posterior Attempt one:    Cardioversion mode:  Synchronous   Waveform:  Biphasic   Shock (joules) attempt one: 120.   Shock outcome:  No change in rhythm Attempt two:    Cardioversion mode:  Synchronous   Waveform:  Biphasic   Shock (Joules):  200   Shock outcome:  Conversion to normal sinus rhythm Post-procedure details:    Patient status:  Alert   Patient tolerance of procedure:  Tolerated well, no immediate complications   Procedural sedation Performed by: Lawrence RollerBrian D Taray Delgado Consent: Verbal consent obtained. Risks and benefits: risks, benefits and alternatives were discussed Required items: required blood products, implants, devices, and special equipment available Patient identity confirmed: arm band and provided demographic data Time out: Immediately prior to procedure a "time out" was called to verify the correct patient, procedure, equipment, support staff and site/side marked as required.  Sedation type: moderate (conscious) sedation NPO time confirmed and considedered  Sedatives: PROPOFOL (50mg ), Versed (2 mg), Fentanyl (50mcg)  Physician Time at Bedside: 20 minutes  Vitals: Vital signs were monitored during sedation. Cardiac Monitor, pulse oximeter Patient tolerance: Patient tolerated the procedure well with no immediate complications. Comments: Pt with uneventful recovered.  Returned to pre-procedural sedation baseline    MDM   The patient is a 61 year old male, known history of atrial fibrillation on anticoagulation for a couple of years, sent from the cardiology office for cardioversion after the patient was found to be in rapid A. fib in the office. He's had onset of palpitations and shortness of breath, on exam the patient doesn't fact have atrial fibrillation with a rapid ventricular rate. He has no peripheral edema, abdomen is soft, there is no obvious murmurs, he does not appear to be in distress.  We'll proceed with procedural sedation as well as cardioversion at Dr. Ludwig Clarksrenshaw's request.  The pt was consented for procedural sedation and for cardioversion.  The patient failed 120 J of biphasic synchronized cardioversion, he was successful with 200 J of biphasic cardioversion on second attempt.   Medical screening examination/treatment/procedure(s) were conducted as a shared visit with non-physician practitioner(s) and myself.  I personally evaluated the patient during the encounter.  Clinical Impression:   Final diagnoses:  Chronic atrial fibrillation (HCC)          Lawrence HongBrian Noni Stonesifer, MD 09/19/16 [phone]

## 2016-09-18 NOTE — Patient Instructions (Signed)
Your physician has recommended that you have a Cardioversion (DCCV). Electrical Cardioversion uses a jolt of electricity to your heart either through paddles or wired patches attached to your chest. This is a controlled, usually prescheduled, procedure. Defibrillation is done under light anesthesia in the hospital, and you usually go home the day of the procedure. This is done to get your heart back into a normal rhythm. You are not awake for the procedure. Please see the instruction sheet given to you today.   Your physician recommends that you schedule a follow-up appointment in: ATRIAL FIB CLINIC NEXT AVAILABLE  Your physician recommends that you schedule a follow-up appointment in: 4-6 WEEKS WITH DR Jens SomRENSHAW

## 2016-09-23 ENCOUNTER — Ambulatory Visit (HOSPITAL_COMMUNITY): Payer: Medicare Other | Admitting: Nurse Practitioner

## 2016-09-23 ENCOUNTER — Telehealth: Payer: Self-pay | Admitting: Cardiology

## 2016-09-23 NOTE — Telephone Encounter (Signed)
New Message    Please call needs to speak with Lawrence Delgado

## 2016-09-23 NOTE — Telephone Encounter (Signed)
Spoke with pt states that he needs to cancel appt scheduled 09-25-16 he has not had testing done yet so he will come in after testing is done  Appt cancelled

## 2016-09-23 NOTE — Progress Notes (Deleted)
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 Admitted with new onset atrial fibrillation 10/16. TSH normal. Echocardiogram October 2016 was technically difficult. With Definity ejection fraction was felt to be 20%. Transesophageal echocardiogram October 2016 showed ejection fraction 35-40% and mild mitral regurgitation. Nuclear study 11/16 showed EF 43, prior anterior apical infarct and no ischemia. Contacted office recently with complaints of recurrent atrial fibrillation. Had DCCV in ER 09/18/16. Since last seen,   Current Outpatient Prescriptions  Medication Sig Dispense Refill  . apixaban (ELIQUIS) 5 MG TABS tablet Take 1 tablet (5 mg total) by mouth 2 (two) times daily. 180 tablet 3  . budesonide-formoterol (SYMBICORT) 80-4.5 MCG/ACT inhaler Inhale 2 puffs into the lungs 2 (two) times daily. 1 Inhaler 12  . furosemide (LASIX) 40 MG tablet Take 1 tablet (40 mg total) by mouth daily. 90 tablet 3  . insulin lispro (HUMALOG) 100 UNIT/ML injection Inject 7-17 Units into the skin 3 (three) times daily before meals. Sliding scale    . insulin lispro protamine-lispro (HUMALOG 75/25 MIX) (75-25) 100 UNIT/ML SUSP injection Inject 50 Units into the skin 2 (two) times daily with a meal.     . irbesartan (AVAPRO) 150 MG tablet TAKE 1 TABLET DAILY 90 tablet 1  . LORazepam (ATIVAN) 1 MG tablet Take 1 mg by mouth 2 (two) times daily.    . metFORMIN (GLUCOPHAGE) 500 MG tablet Take 500 mg by mouth 2 (two) times daily with a meal.     . metoprolol succinate (TOPROL-XL) 50 MG 24 hr tablet TAKE (2) TABLETS DAILY 180 tablet 1  . nitroGLYCERIN (NITROSTAT) 0.4 MG SL tablet Place 1 tablet (0.4 mg total) under the tongue every 5 (five) minutes as needed. 25 tablet 5  . rosuvastatin (CRESTOR) 40 MG tablet TAKE 1 TABLET DAILY 90 tablet 1  . Vitamin D, Ergocalciferol, (DRISDOL) 50000 UNITS CAPS Take 50,000 Units by mouth 2 (two)  times a week. Monday and Thursday     No current facility-administered medications for this visit.      Past Medical History:  Diagnosis Date  . CAD (coronary artery disease)   . DM (diabetes mellitus) (HCC)   . GERD (gastroesophageal reflux disease)   . HLD (hyperlipidemia)   . HTN (hypertension)   . MI (myocardial infarction)   . Obesity   . Sleep apnea     Past Surgical History:  Procedure Laterality Date  . CARDIAC CATHETERIZATION     with coronary angiography   . CARDIOVERSION N/A 05/05/2015   Procedure: CARDIOVERSION;  Surgeon: Vesta MixerPhilip J Nahser, MD;  [facility];  Service: Cardiovascular;  Laterality: N/A;  . CORONARY STENT PLACEMENT     eluting stent placement in the culpric lesion of the LAD  . TEE WITHOUT CARDIOVERSION N/A 05/05/2015   Procedure: TRANSESOPHAGEAL ECHOCARDIOGRAM (TEE);  Surgeon: Vesta MixerPhilip J Nahser, MD;  [facility];  Service: Cardiovascular;  Laterality: N/A;    Social History   Social History  . Marital status: Married    Spouse name: N/A  . Number of children: N/A  . Years of education: N/A   Occupational History  . Not on file.   Social History Main Topics  . Smoking status: Former Smoker    Packs/day: 3.00    Years: 25.00  . Smokeless tobacco: Never Used     Comment: quit smoking in 2005  . Alcohol use No  .  Drug use: No  . Sexual activity: Not on file   Other Topics Concern  . Not on file   Social History Narrative  . No narrative on file    Family History  Problem Relation Age of Onset  . Heart attack Mother   . Heart attack Father     ROS: no fevers or chills, productive cough, hemoptysis, dysphasia, odynophagia, melena, hematochezia, dysuria, hematuria, rash, seizure activity, orthopnea, PND, pedal edema, claudication. Remaining systems are negative.  Physical Exam: Well-developed well-nourished in no acute distress.  Skin is warm and dry.  HEENT is normal.  Neck is supple.  Chest is clear to  auscultation with normal expansion.  Cardiovascular exam is regular rate and rhythm.  Abdominal exam nontender or distended. No masses palpated. Extremities show no edema. neuro grossly intact  ECG

## 2016-09-25 ENCOUNTER — Ambulatory Visit: Payer: Medicare Other | Admitting: Cardiology

## 2016-09-30 ENCOUNTER — Encounter (HOSPITAL_COMMUNITY): Payer: Self-pay | Admitting: Nurse Practitioner

## 2016-09-30 ENCOUNTER — Ambulatory Visit (HOSPITAL_COMMUNITY)
Admission: RE | Admit: 2016-09-30 | Discharge: 2016-09-30 | Disposition: A | Discharge status: Home / Self Care | Payer: Medicare Other | Source: Ambulatory Visit | Attending: Nurse Practitioner | Admitting: Nurse Practitioner

## 2016-09-30 ENCOUNTER — Telehealth: Payer: Self-pay | Admitting: Cardiology

## 2016-09-30 VITALS — BP 144/86 | HR 57 | Ht 73.0 in | Wt 284.6 lb

## 2016-09-30 DIAGNOSIS — Z6837 Body mass index (BMI) 37.0-37.9, adult: Secondary | ICD-10-CM | POA: Diagnosis not present

## 2016-09-30 DIAGNOSIS — E785 Hyperlipidemia, unspecified: Secondary | ICD-10-CM | POA: Diagnosis not present

## 2016-09-30 DIAGNOSIS — I252 Old myocardial infarction: Secondary | ICD-10-CM | POA: Diagnosis not present

## 2016-09-30 DIAGNOSIS — G4730 Sleep apnea, unspecified: Secondary | ICD-10-CM | POA: Diagnosis not present

## 2016-09-30 DIAGNOSIS — E669 Obesity, unspecified: Secondary | ICD-10-CM | POA: Insufficient documentation

## 2016-09-30 DIAGNOSIS — Z794 Long term (current) use of insulin: Secondary | ICD-10-CM | POA: Insufficient documentation

## 2016-09-30 DIAGNOSIS — Z79899 Other long term (current) drug therapy: Secondary | ICD-10-CM | POA: Diagnosis not present

## 2016-09-30 DIAGNOSIS — I2510 Atherosclerotic heart disease of native coronary artery without angina pectoris: Secondary | ICD-10-CM | POA: Diagnosis not present

## 2016-09-30 DIAGNOSIS — Z87891 Personal history of nicotine dependence: Secondary | ICD-10-CM | POA: Diagnosis not present

## 2016-09-30 DIAGNOSIS — Z7901 Long term (current) use of anticoagulants: Secondary | ICD-10-CM | POA: Diagnosis not present

## 2016-09-30 DIAGNOSIS — I480 Paroxysmal atrial fibrillation: Secondary | ICD-10-CM

## 2016-09-30 DIAGNOSIS — E119 Type 2 diabetes mellitus without complications: Secondary | ICD-10-CM | POA: Insufficient documentation

## 2016-09-30 DIAGNOSIS — I10 Essential (primary) hypertension: Secondary | ICD-10-CM | POA: Insufficient documentation

## 2016-09-30 DIAGNOSIS — K219 Gastro-esophageal reflux disease without esophagitis: Secondary | ICD-10-CM | POA: Diagnosis not present

## 2016-09-30 LAB — BASIC METABOLIC PANEL
ANION GAP: 7 (ref 5–15)
BUN: 6 mg/dL (ref 6–20)
CO2: 35 mmol/L — ABNORMAL HIGH (ref 22–32)
Calcium: 8.8 mg/dL — ABNORMAL LOW (ref 8.9–10.3)
Chloride: 93 mmol/L — ABNORMAL LOW (ref 101–111)
Creatinine, Ser: 1.12 mg/dL (ref 0.61–1.24)
GFR calc Af Amer: >60 mL/min (ref >60)
Glucose, Bld: 355 mg/dL — ABNORMAL HIGH (ref 65–99)
POTASSIUM: 3 mmol/L — AB (ref 3.5–5.1)
SODIUM: 135 mmol/L (ref 135–145)

## 2016-09-30 LAB — MAGNESIUM: MAGNESIUM: 1.6 mg/dL — AB (ref 1.7–2.4)

## 2016-09-30 MED ORDER — DILTIAZEM HCL 30 MG PO TABS: ORAL_TABLET | ORAL | 1 refills | Status: DC

## 2016-09-30 NOTE — Progress Notes (Signed)
Primary Care Physician: Samuel Jester, DO Referring Physician: Dr. Loraine Leriche is a 61 y.o. 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 Admitted with new onset atrial fibrillation 10/16. TSH normal. Echocardiogram October 2016 was technically difficult. With Definity ejection fraction was felt to be 20%. Transesophageal echocardiogram October 2016 showed ejection fraction 35-40% and mild mitral regurgitation. Nuclear study 11/16 showed EF 43, prior anterior apical infarct and no ischemia.  He was seen in the ER with rapid afib at 161 bpm and was successfully cardioverted. Today in the afib clinic he is in SR, EKG shows sinus brady at 57 bpm. He is asking for medications that can help him stay in rhythm. He wears oxygen and was recently evaluated by a pulmonologist who felt his issues were most likely do to COPD, smoking for many years, but quitting in 2005 and heart failure. He denies using alcohol, mod caffeine, uses cpap. Morbidly obese and sedentary. Left atrium was normal in size in 2016.  Today, he denies symptoms of palpitations, chest pain, shortness of breath, orthopnea, PND, lower extremity edema, dizziness, presyncope, syncope, or neurologic sequela. The patient is tolerating medications without difficulties and is otherwise without complaint today.   Past Medical History:  Diagnosis Date  . CAD (coronary artery disease)   . DM (diabetes mellitus) (HCC)   . GERD (gastroesophageal reflux disease)   . HLD (hyperlipidemia)   . HTN (hypertension)   . MI (myocardial infarction)   . Obesity   . Sleep apnea    Past Surgical History:  Procedure Laterality Date  . CARDIAC CATHETERIZATION     with coronary angiography   . CARDIOVERSION N/A 05/05/2015   Procedure: CARDIOVERSION;  Surgeon: Vesta Mixer, MD;  Location[facility] ENDOSCOPY;  Service: Cardiovascular;  Laterality: N/A;  . CORONARY STENT PLACEMENT     eluting  stent placement in the culpric lesion of the LAD  . TEE WITHOUT CARDIOVERSION N/A 05/05/2015   Procedure: TRANSESOPHAGEAL ECHOCARDIOGRAM (TEE);  Surgeon: Vesta Mixer, MD;  Location: [facility] ENDOSCOPY;  Service: Cardiovascular;  Laterality: N/A;    Current Outpatient Prescriptions  Medication Sig Dispense Refill  . apixaban (ELIQUIS) 5 MG TABS tablet Take 1 tablet (5 mg total) by mouth 2 (two) times daily. 180 tablet 3  . budesonide-formoterol (SYMBICORT) 80-4.5 MCG/ACT inhaler Inhale 2 puffs into the lungs 2 (two) times daily. 1 Inhaler 12  . furosemide (LASIX) 40 MG tablet Take 1 tablet (40 mg total) by mouth daily. 90 tablet 3  . insulin lispro (HUMALOG) 100 UNIT/ML injection Inject 7-17 Units into the skin 3 (three) times daily before meals. Sliding scale    . insulin lispro protamine-lispro (HUMALOG 75/25 MIX) (75-25) 100 UNIT/ML SUSP injection Inject 50 Units into the skin 2 (two) times daily with a meal.     . irbesartan (AVAPRO) 150 MG tablet TAKE 1 TABLET DAILY 90 tablet 1  . LORazepam (ATIVAN) 1 MG tablet Take 1 mg by mouth 2 (two) times daily.    . metFORMIN (GLUCOPHAGE) 500 MG tablet Take 500 mg by mouth 2 (two) times daily with a meal.     . metoprolol succinate (TOPROL-XL) 50 MG 24 hr tablet TAKE (2) TABLETS DAILY 180 tablet 1  . nitroGLYCERIN (NITROSTAT) 0.4 MG SL tablet Place 1 tablet (0.4 mg total) under the tongue every 5 (five) minutes as needed. 25 tablet 5  . rosuvastatin (CRESTOR) 40 MG tablet  TAKE 1 TABLET DAILY 90 tablet 1  . spironolactone (ALDACTONE) 25 MG tablet 1 tablet daily.    . Vitamin D, Ergocalciferol, (DRISDOL) 50000 UNITS CAPS Take 50,000 Units by mouth 2 (two) times a week. Monday and Thursday    . diltiazem (CARDIZEM) 30 MG tablet Take 1 tablet every 4 hours AS NEEDED for afib fast heart rate greater than 100 45 tablet 1   No current facility-administered medications for this encounter.     Allergies  Allergen Reactions  . Codeine     Liquid for caused  REACTION: Nausea/vomiting  . Clopidogrel Bisulfate Itching and Rash    Social History   Social History  . Marital status: Married    Spouse name: N/A  . Number of children: N/A  . Years of education: N/A   Occupational History  . Not on file.   Social History Main Topics  . Smoking status: Former Smoker    Packs/day: 3.00    Years: 25.00  . Smokeless tobacco: Never Used     Comment: quit smoking in 2005  . Alcohol use No  . Drug use: No  . Sexual activity: Not on file   Other Topics Concern  . Not on file   Social History Narrative  . No narrative on file    Family History  Problem Relation Age of Onset  . Heart attack Mother   . Heart attack Father     ROS- All systems are reviewed and negative except as per the HPI above  Physical Exam: Vitals:   09/30/16 0903  BP: (!) 144/86  Pulse: (!) 57  Weight: 284 lb 9.6 oz (129.1 kg)  Height: 6\' 1"  (1.854 m)   Wt Readings from Last 3 Encounters:  09/30/16 284 lb 9.6 oz (129.1 kg)  09/18/16 278 lb (126.1 kg)  09/05/16 283 lb 9.6 oz (128.6 kg)    Labs: Lab Results  Component Value Date   NA 135 09/30/2016   K 3.0 (L) 09/30/2016   CL 93 (L) 09/30/2016   CO2 35 (H) 09/30/2016   GLUCOSE 355 (H) 09/30/2016   BUN 6 09/30/2016   CREATININE 1.12 09/30/2016   CALCIUM 8.8 (L) 09/30/2016   MG 1.6 (L) 09/30/2016   No results found for: INR Lab Results  Component Value Date   CHOL 142 05/13/2013   HDL 39.20 05/13/2013   LDLCALC 63 05/13/2013   TRIG 197.0 (H) 05/13/2013     GEN- The patient is well appearing, alert and oriented x 3 today.  Wearing O2 by [place] Head- normocephalic, atraumatic Eyes-  Sclera clear, conjunctiva pink Ears- hearing intact Oropharynx- clear Neck- supple, no JVP Lymph- no cervical lymphadenopathy Lungs- Clear to ausculation bilaterally, normal work of breathing Heart- Regular rate and rhythm, no murmurs, rubs or gallops, PMI not laterally displaced GI- soft, NT, ND, +  BS Extremities- no clubbing, cyanosis, or edema MS- no significant deformity or atrophy Skin- no rash or lesion Psych- euthymic mood, full affect Neuro- strength and sensation are intact  EKG-Sinus brady at 57 bpm, pr itn 158 ms, qrs int 78 ms, qtc 434 ms Epic records reviewed    Assessment and Plan: 1. Paroxysmal afib  Pt very symptomatic in afib with recent successful cardioversion He is inquiring re change in meds to keep him in SR Has never been on AAD's Unfortunately, flecainide and multaq are not options due to LV dysfunction and CAD I feel he is too young for amiodarone due to potential side effects Not  the best ablation candidate due to obesity, and has not tried antiarrythmic's  That would leave sotalol or Tikosyn and he is afraid he would not be able to afford tiksoyn Creat cl cal at 129.63 Qtc less than 440 ms, today at 434 ms, adequate for qtc prolonging drug initiation  He has been given both names to check with insurance for cost if he is interested in pursing this He had hypokalemia in the ER and will repeat bmet and mag as K+ will need to be over 4 and mag over 2 if these drugs are started. He will continue apixaban which he states he has not missed any doses Echo probably needs update and if he comes into hospital could possibly be done then as he has transportation issues and does not feel he will be able to do this week If he agrees to hospitalization, next Monday or Tuesday would be the days to try to schedule him coming into hospital once electrolytes are repleted. Cardizem 30 mg given to use as needed if he has recurrent afib as pill in pocket  F/u next Monday   Diannah Rindfleisch C. Matthew Folksarroll, ANP-C [facility] [address] [place], [place] [address] [phone]

## 2016-09-30 NOTE — Telephone Encounter (Signed)
Called, number busy when dialed.

## 2016-09-30 NOTE — Telephone Encounter (Signed)
Spoke with pt, questions regarding tikosyn admission and atrial fib answered. He is trying to figure out if he needs to do this or not. He wants to know what dr Jens Somcrenshaw thinks. Will discuss with dr Jens Somcrenshaw and call him back.

## 2016-09-30 NOTE — Patient Instructions (Signed)
Your physician has recommended you make the following change in your medication:  1) Cardizem 30mg  -- take 1 tablet every 4 hours AS NEEDED for AFIB fast heart rate greater than 100  (check your blood pressure before taking the medication to make sure you heart rate on the top is above 100)

## 2016-09-30 NOTE — Telephone Encounter (Signed)
New message    Pt is calling to ask a question about his visit at the afib. He wants to talk to Dr. Jens Somrenshaw and RN about it.

## 2016-10-01 ENCOUNTER — Other Ambulatory Visit (HOSPITAL_COMMUNITY): Payer: Self-pay | Admitting: *Deleted

## 2016-10-01 MED ORDER — MAGNESIUM OXIDE 400 MG PO TABS: 400.0000 mg | ORAL_TABLET | Freq: Every day | ORAL | 3 refills | Status: DC

## 2016-10-01 MED ORDER — POTASSIUM CHLORIDE ER 10 MEQ PO TBCR: 20.0000 meq | EXTENDED_RELEASE_TABLET | Freq: Every day | ORAL | 3 refills | Status: DC

## 2016-10-01 NOTE — Telephone Encounter (Signed)
°  Follow up   pt verbalized that he is calling back to speak to rn

## 2016-10-01 NOTE — Telephone Encounter (Signed)
Follow Up    Pt wife calling back regarding her husbands potassium being low. Per pt wife patient is scheduled to go into hospital, and requesting to speak with nurse.

## 2016-10-01 NOTE — Telephone Encounter (Signed)
Spoke with pt, questions regarding tikosyn admission and lab work answered.

## 2016-10-02 ENCOUNTER — Telehealth: Payer: Self-pay | Admitting: Pharmacist

## 2016-10-02 NOTE — Telephone Encounter (Signed)
Pt to be admitted 3/12 for Tikosyn initiation. He is not taking any QTc prolonging or contraindicated medications. Pt is anticoagulated with Eliquis 5mg  BID - correct dosing based on age < 80, wt > 60kg, and SCr < 1.5. Pt reported he had not missed any doses within the past month at last visit with Rudi Cocoonna Carroll on 09/30/16. Electrolytes being supplemented. Ok for [employer] initiation from a medication perspective.

## 2016-10-07 ENCOUNTER — Inpatient Hospital Stay (HOSPITAL_COMMUNITY): Admission: RE | Admit: 2016-10-07 | Payer: Medicare Other | Source: Ambulatory Visit | Admitting: Nurse Practitioner

## 2016-10-14 ENCOUNTER — Telehealth (HOSPITAL_COMMUNITY): Payer: Self-pay | Admitting: *Deleted

## 2016-10-14 ENCOUNTER — Inpatient Hospital Stay (HOSPITAL_COMMUNITY): Admission: RE | Admit: 2016-10-14 | Payer: Medicare Other | Source: Ambulatory Visit | Admitting: Nurse Practitioner

## 2016-10-14 NOTE — Telephone Encounter (Signed)
Patient called multiple times over the last week hesitant in regards to starting tikosyn. Read "a lot on online" for Tikosyn and is scared he will die if he starts on the medication. Went over risk versus benefit but patient and wife are very uneasy and ultimately decided to cancel the appointment for admission today and will call back later. Encouraged to use PRN cardizem for breakthrough afib and wife states they will call back if he starts having a lot of breakthrough afib that warrants the medication. Will forward message to patient's primary cardiologist as an FYI of above information.

## 2016-10-24 ENCOUNTER — Encounter: Payer: Self-pay | Admitting: Cardiology

## 2016-10-29 NOTE — Progress Notes (Signed)
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 Admitted with new onset atrial fibrillation 10/16. TSH normal. Echocardiogram October 2016 was technically difficult. With Definity ejection fraction was felt to be 20%. Transesophageal echocardiogram October 2016 showed ejection fraction 35-40% and mild mitral regurgitation. Nuclear study 11/16 showed EF 43, prior anterior apical infarct and no ischemia. Had DCCV 09/18/16. Pt seen in a fib clinic and offered tikosyn but declined for now. Since last seen, the patient has dyspnea with more extreme activities but not with routine activities. It is relieved with rest. It is not associated with chest pain. There is no orthopnea, PND or pedal edema. There is no syncope or palpitations. There is no exertional chest pain.   Current Outpatient Prescriptions  Medication Sig Dispense Refill  . apixaban (ELIQUIS) 5 MG TABS tablet Take 1 tablet (5 mg total) by mouth 2 (two) times daily. 180 tablet 3  . budesonide-formoterol (SYMBICORT) 80-4.5 MCG/ACT inhaler Inhale 2 puffs into the lungs 2 (two) times daily. 1 Inhaler 12  . diltiazem (CARDIZEM) 30 MG tablet Take 1 tablet every 4 hours AS NEEDED for afib fast heart rate greater than 100 45 tablet 1  . furosemide (LASIX) 40 MG tablet Take 1 tablet (40 mg total) by mouth daily. 90 tablet 3  . insulin lispro (HUMALOG) 100 UNIT/ML injection Inject 7-17 Units into the skin 3 (three) times daily before meals. Sliding scale    . insulin lispro protamine-lispro (HUMALOG 75/25 MIX) (75-25) 100 UNIT/ML SUSP injection Inject 50 Units into the skin 2 (two) times daily with a meal.     . irbesartan (AVAPRO) 150 MG tablet TAKE 1 TABLET DAILY 90 tablet 1  . magnesium oxide (MAG-OX) 400 MG tablet Take 1 tablet (400 mg total) by mouth daily. 30 tablet 3  . metFORMIN (GLUCOPHAGE) 500 MG tablet Take 500 mg by mouth 2 (two) times daily with a meal.      . metoprolol succinate (TOPROL-XL) 50 MG 24 hr tablet TAKE (2) TABLETS DAILY 180 tablet 1  . nitroGLYCERIN (NITROSTAT) 0.4 MG SL tablet Place 1 tablet (0.4 mg total) under the tongue every 5 (five) minutes as needed. 25 tablet 5  . potassium chloride (K-DUR) 10 MEQ tablet Take 2 tablets (20 mEq total) by mouth daily. 60 tablet 3  . rosuvastatin (CRESTOR) 40 MG tablet TAKE 1 TABLET DAILY 90 tablet 1  . spironolactone (ALDACTONE) 25 MG tablet 1 tablet daily.    . Vitamin D, Ergocalciferol, (DRISDOL) 50000 UNITS CAPS Take 50,000 Units by mouth 2 (two) times a week. Monday and Thursday     No current facility-administered medications for this visit.      Past Medical History:  Diagnosis Date  . CAD (coronary artery disease)   . DM (diabetes mellitus) (HCC)   . GERD (gastroesophageal reflux disease)   . HLD (hyperlipidemia)   . HTN (hypertension)   . MI (myocardial infarction)   . Obesity   . Sleep apnea     Past Surgical History:  Procedure Laterality Date  . CARDIAC CATHETERIZATION     with coronary angiography   . CARDIOVERSION N/A 05/05/2015   Procedure: CARDIOVERSION;  Surgeon: Vesta Mixer, MD;  Location: [facility] ENDOSCOPY;  Service: Cardiovascular;  Laterality: N/A;  . CORONARY STENT PLACEMENT     eluting stent placement in the culpric lesion of the LAD  . TEE WITHOUT CARDIOVERSION N/A 05/05/2015  Procedure: TRANSESOPHAGEAL ECHOCARDIOGRAM (TEE);  Surgeon: Vesta Mixer, MD;  Location: [facility] ENDOSCOPY;  Service: Cardiovascular;  Laterality: N/A;    Social History   Social History  . Marital status: Married    Spouse name: N/A  . Number of children: N/A  . Years of education: N/A   Occupational History  . Not on file.   Social History Main Topics  . Smoking status: Former Smoker    Packs/day: 3.00    Years: 25.00  . Smokeless tobacco: Never Used     Comment: quit smoking in 2005  . Alcohol use No  . Drug use: No  . Sexual activity: Not on file   Other Topics  Concern  . Not on file   Social History Narrative  . No narrative on file    Family History  Problem Relation Age of Onset  . Heart attack Mother   . Heart attack Father     ROS: no fevers or chills, productive cough, hemoptysis, dysphasia, odynophagia, melena, hematochezia, dysuria, hematuria, rash, seizure activity, orthopnea, PND, pedal edema, claudication. Remaining systems are negative.  Physical Exam: Well-developed obese in no acute distress.  Skin is warm and dry.  HEENT is normal.  Neck is supple.  Chest is clear to auscultation with normal expansion.  Cardiovascular exam is regular rate and rhythm.  Abdominal exam nontender or distended. No masses palpated. Extremities show no edema. neuro grossly intact  ECG- normal sinus rhythm at a rate of 68. No ST changes. personally reviewed  A/P  1 Paroxysmal atrial fibrillation-patient remains in sinus rhythm today. Continue Toprol; continue apixaban. He is concerned about the possible side effects of tikosyn. We will continue with present therapy for now. If he has more frequent episodes in the future we can consider tikosyn at that point. Repeat echocardiogram.  2 coronary artery disease-continue statin. No aspirin given need for anticoagulation.  3 hypertension-blood pressure controlled. Continue present medications.  4 hyperlipidemia-continue statin. Lipids and liver monitored by primary care.   5 chronic combined systolic/diastolic congestive heart failure-continue present dose of diuretics.   6 dyspnea-likely multifactorial. There appears to be a component of congestive heart failure but also obesity hypoventilation syndrome and obstructive sleep apnea. Now followed by pulmonary.  7 ICM-continue ARB and beta blocker.  Olga Millers, MD

## 2016-10-30 ENCOUNTER — Telehealth: Payer: Self-pay | Admitting: Cardiology

## 2016-10-30 NOTE — Telephone Encounter (Signed)
She needs to know if pt has CHF and would also need his last Ejection Fraction from his last Echo.

## 2016-10-30 NOTE — Telephone Encounter (Signed)
Spoke with Shanda Bumps, the patient has systolic heart failure and his last EF% was 35-40% on 05-05-2015.

## 2016-11-04 ENCOUNTER — Ambulatory Visit: Payer: Medicare Other | Admitting: Cardiology

## 2016-11-06 ENCOUNTER — Ambulatory Visit (INDEPENDENT_AMBULATORY_CARE_PROVIDER_SITE_OTHER): Payer: Medicare Other | Admitting: Cardiology

## 2016-11-06 ENCOUNTER — Encounter: Payer: Self-pay | Admitting: Cardiology

## 2016-11-06 VITALS — BP 130/69 | HR 68 | Ht 73.0 in | Wt 268.0 lb

## 2016-11-06 DIAGNOSIS — I2510 Atherosclerotic heart disease of native coronary artery without angina pectoris: Secondary | ICD-10-CM | POA: Diagnosis not present

## 2016-11-06 DIAGNOSIS — E7800 Pure hypercholesterolemia, unspecified: Secondary | ICD-10-CM | POA: Diagnosis not present

## 2016-11-06 DIAGNOSIS — I4891 Unspecified atrial fibrillation: Secondary | ICD-10-CM | POA: Diagnosis not present

## 2016-11-06 DIAGNOSIS — I10 Essential (primary) hypertension: Secondary | ICD-10-CM | POA: Diagnosis not present

## 2016-11-06 NOTE — Patient Instructions (Signed)
Medication Instructions:   NO CHANGE  Testing/Procedures:  Your physician has requested that you have an echocardiogram. Echocardiography is a painless test that uses sound waves to create images of your heart. It provides your doctor with information about the size and shape of your heart and how well your heart's chambers and valves are working. This procedure takes approximately one hour. There are no restrictions for this procedure.    Follow-Up:  Your physician recommends that you schedule a follow-up appointment in: 3 MONTHS WITH DR CRENSHAW   If you need a refill on your cardiac medications before your next appointment, please call your pharmacy.    

## 2016-11-08 ENCOUNTER — Encounter: Payer: Self-pay | Admitting: Cardiology

## 2016-11-08 NOTE — Telephone Encounter (Signed)
This encounter was created in error - please disregard.

## 2016-11-08 NOTE — Telephone Encounter (Signed)
New Message  Pts wife voiced to have nurse to give him a call about pts results.  Please f/u

## 2016-11-11 ENCOUNTER — Inpatient Hospital Stay (HOSPITAL_COMMUNITY): Admission: RE | Admit: 2016-11-11 | Payer: Medicare Other | Source: Ambulatory Visit | Admitting: Nurse Practitioner

## 2016-11-20 ENCOUNTER — Ambulatory Visit: Payer: Medicare Other | Admitting: Pulmonary Disease

## 2016-11-25 ENCOUNTER — Other Ambulatory Visit: Payer: Self-pay | Admitting: Cardiology

## 2016-11-25 DIAGNOSIS — I2510 Atherosclerotic heart disease of native coronary artery without angina pectoris: Secondary | ICD-10-CM

## 2016-11-26 ENCOUNTER — Ambulatory Visit (HOSPITAL_COMMUNITY): Admission: RE | Admit: 2016-11-26 | Payer: Medicare Other | Source: Ambulatory Visit

## 2016-12-06 ENCOUNTER — Other Ambulatory Visit: Payer: Self-pay

## 2016-12-06 ENCOUNTER — Telehealth: Payer: Self-pay

## 2016-12-06 ENCOUNTER — Encounter (HOSPITAL_BASED_OUTPATIENT_CLINIC_OR_DEPARTMENT_OTHER): Payer: Medicare Other

## 2016-12-06 DIAGNOSIS — R0683 Snoring: Secondary | ICD-10-CM

## 2016-12-06 NOTE — Telephone Encounter (Signed)
-----   Message from Chilton GreathousePraveen Mannam, MD sent at 12/06/2016 12:25 PM EDT ----- Regarding: RE: home sleep study Ok. Please order split night sleep study in lab.  Praveen Mannam  ----- Message ----- From: Donnamae JudeMcMichael, Sherry P Sent: 12/02/2016  11:56 AM To: Chilton GreathousePraveen Mannam, MD, Eliseo GumMargie A Chaze Hruska, CMA Subject: home sleep study                               Order was placed on 2/8 for pt to have home sleep study.  He wanted to put off having study done until he was seen at hospital in April to get atrial fib taken care of.  I spoke to pt today to schedule hst & he stated he is on O2 24 hrs a day so he will have to go to sleep lab.  Not able to hook O2 to hst.   Cordelia PenSherry

## 2016-12-06 NOTE — Telephone Encounter (Signed)
Split night has been ordered per PM.

## 2016-12-26 ENCOUNTER — Telehealth: Payer: Self-pay | Admitting: Cardiology

## 2016-12-26 DIAGNOSIS — I480 Paroxysmal atrial fibrillation: Secondary | ICD-10-CM

## 2016-12-26 NOTE — Telephone Encounter (Signed)
New Message     Pt was suppose to have an Echo done, there is no order showing in system

## 2016-12-26 NOTE — Telephone Encounter (Signed)
Order placed for echo to be done at the [place] location, will forward to admin to contact the patient and schedule

## 2016-12-31 ENCOUNTER — Other Ambulatory Visit: Payer: Self-pay | Admitting: Cardiology

## 2017-01-02 ENCOUNTER — Ambulatory Visit (HOSPITAL_COMMUNITY): Admission: RE | Admit: 2017-01-02 | Payer: Medicare Other | Source: Ambulatory Visit

## 2017-01-02 NOTE — Telephone Encounter (Signed)
Follow up      [facility] week of July 9th.  Pt want a morning appt.  Not sure who schedules this appt.  Dr Jens Somrenshaw ordered the test.

## 2017-01-03 NOTE — Telephone Encounter (Signed)
Echo scheduled.

## 2017-01-07 ENCOUNTER — Ambulatory Visit: Payer: Medicare Other | Admitting: Adult Health

## 2017-01-10 ENCOUNTER — Other Ambulatory Visit (HOSPITAL_COMMUNITY): Payer: Self-pay | Admitting: Nurse Practitioner

## 2017-01-30 NOTE — Progress Notes (Signed)
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 Admitted with new onset atrial fibrillation 10/16. TSH normal. Echocardiogram October 2016 was technically difficult. With Definity ejection fraction was felt to be 20%. Transesophageal echocardiogram October 2016 showed ejection fraction 35-40% and mild mitral regurgitation. Nuclear study 11/16 showed EF 43, prior anterior apical infarct and no ischemia. Had DCCV 09/18/16. Pt seen in a fib clinic and offered tikosyn but declined for now. Echocardiogram ordere at last office visit but not performed. Since last seen, patient has mild dyspnea on exertion but no orthopnea, PND, pedal edema, palpitations, syncope, chest pain or bleeding.  Current Outpatient Prescriptions  Medication Sig Dispense Refill  . apixaban (ELIQUIS) 5 MG TABS tablet Take 1 tablet (5 mg total) by mouth 2 (two) times daily. 180 tablet 3  . budesonide-formoterol (SYMBICORT) 80-4.5 MCG/ACT inhaler Inhale 2 puffs into the lungs 2 (two) times daily. 1 Inhaler 12  . furosemide (LASIX) 40 MG tablet TAKE 1 TABLET DAILY 90 tablet 0  . insulin lispro (HUMALOG) 100 UNIT/ML injection Inject 7-17 Units into the skin 3 (three) times daily before meals. Sliding scale    . insulin lispro protamine-lispro (HUMALOG 75/25 MIX) (75-25) 100 UNIT/ML SUSP injection Inject 50 Units into the skin 2 (two) times daily with a meal.     . irbesartan (AVAPRO) 150 MG tablet TAKE 1 TABLET DAILY 90 tablet 1  . magnesium oxide (MAG-OX) 400 (241.3 Mg) MG tablet TAKE 1 TABLET DAILY 30 tablet 0  . metFORMIN (GLUCOPHAGE) 500 MG tablet Take 500 mg by mouth 2 (two) times daily with a meal.     . metoprolol succinate (TOPROL-XL) 50 MG 24 hr tablet TAKE (2) TABLETS DAILY 180 tablet 3  . nitroGLYCERIN (NITROSTAT) 0.4 MG SL tablet Place 1 tablet (0.4 mg total) under the tongue every 5 (five) minutes as needed. 25 tablet 5  . potassium  chloride (K-DUR) 10 MEQ tablet TAKE (2) TABLETS DAILY 60 tablet 0  . rosuvastatin (CRESTOR) 40 MG tablet TAKE 1 TABLET DAILY 90 tablet 1  . spironolactone (ALDACTONE) 25 MG tablet 1 tablet daily.    . Vitamin D, Ergocalciferol, (DRISDOL) 50000 UNITS CAPS Take 50,000 Units by mouth 2 (two) times a week. Monday and Thursday    . diltiazem (CARDIZEM) 30 MG tablet Take 1 tablet every 4 hours AS NEEDED for afib fast heart rate greater than 100 (Patient not taking: Reported on 02/12/2017) 45 tablet 1   No current facility-administered medications for this visit.      Past Medical History:  Diagnosis Date  . CAD (coronary artery disease)   . DM (diabetes mellitus) (HCC)   . GERD (gastroesophageal reflux disease)   . HLD (hyperlipidemia)   . HTN (hypertension)   . MI (myocardial infarction) (HCC)   . Obesity   . Sleep apnea     Past Surgical History:  Procedure Laterality Date  . CARDIAC CATHETERIZATION     with coronary angiography   . CARDIOVERSION N/A 05/05/2015   Procedure: CARDIOVERSION;  Surgeon: Vesta MixerPhilip J Nahser, MD;  [facility];  Service: Cardiovascular;  Laterality: N/A;  . CORONARY STENT PLACEMENT     eluting stent placement in the culpric lesion of the LAD  . TEE WITHOUT CARDIOVERSION N/A 05/05/2015   Procedure: TRANSESOPHAGEAL ECHOCARDIOGRAM (TEE);  Surgeon: Vesta MixerPhilip J Nahser, MD;  [facility];  Service: Cardiovascular;  Laterality: N/A;  Social History   Social History  . Marital status: Married    Spouse name: N/A  . Number of children: N/A  . Years of education: N/A   Occupational History  . Not on file.   Social History Main Topics  . Smoking status: Former Smoker    Packs/day: 3.00    Years: 25.00  . Smokeless tobacco: Never Used     Comment: quit smoking in 2005  . Alcohol use No  . Drug use: No  . Sexual activity: Not on file   Other Topics Concern  . Not on file   Social History Narrative  . No narrative on file    Family  History  Problem Relation Age of Onset  . Heart attack Mother   . Heart attack Father     ROS: no fevers or chills, productive cough, hemoptysis, dysphasia, odynophagia, melena, hematochezia, dysuria, hematuria, rash, seizure activity, orthopnea, PND, pedal edema, claudication. Remaining systems are negative.  Physical Exam: Well-developed obese in no acute distress.  Skin is warm and dry.  HEENT is normal.  Neck is supple.  Chest is clear to auscultation with normal expansion.  Cardiovascular exam is irregular and tachycardic Abdominal exam nontender or distended. No masses palpated. Extremities show no edema. neuro grossly intact  ECG-atrial fibrillation with rapid ventricular response at 151, septal infarct, low voltage, nonspecific ST changes. personally reviewed  A/P  1 paroxysmal atrial fibrillation-patient is back in atrial fibrillation. His heart rate is elevated. He is not particular asymptomatic today. I will increase his Toprol to 150 mg daily. Continue apixaban (pt has been compliant). I will arrange cardioversion. We will then arrange atrial fibrillation clinic appointment again. He will likely need to be admitted for initiation of tikosyn as his atrial fibrillation has been recurrent. Repeat echocardiogram.  2 coronary artery disease-continue statin. No aspirin given need for anticoagulation.  3 hypertension-blood pressure is controlled. Continue present medications.  4 hyperlipidemia-continue statin.  5 ischemic cardiomyopathy-continue ARB and beta blocker.  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 Continue present dose of diuretics.  7 dyspnea-this appears to be multifactorial including congestive heart failure, sleep apnea and obesity hypoventilation syndrome. I will refer him back to pulmonary for further evaluation of possible sleep apnea.     Olga MillersBrian Crenshaw, MD

## 2017-02-03 ENCOUNTER — Telehealth: Payer: Self-pay | Admitting: Cardiology

## 2017-02-03 ENCOUNTER — Inpatient Hospital Stay (HOSPITAL_COMMUNITY): Admission: RE | Admit: 2017-02-03 | Payer: Medicare Other | Source: Ambulatory Visit

## 2017-02-05 ENCOUNTER — Ambulatory Visit (INDEPENDENT_AMBULATORY_CARE_PROVIDER_SITE_OTHER): Payer: Medicare Other | Admitting: Pulmonary Disease

## 2017-02-05 ENCOUNTER — Ambulatory Visit: Payer: Medicare Other | Admitting: Adult Health

## 2017-02-05 ENCOUNTER — Telehealth: Payer: Self-pay | Admitting: Cardiology

## 2017-02-05 DIAGNOSIS — R0602 Shortness of breath: Secondary | ICD-10-CM

## 2017-02-05 LAB — PULMONARY FUNCTION TEST
DL/VA % PRED: 110 %
DL/VA: 5.22 ml/min/mmHg/L
DLCO UNC % PRED: 51 %
DLCO cor % pred: 50 %
DLCO cor: 17.63 ml/min/mmHg
DLCO unc: 18.01 ml/min/mmHg
FEF 25-75 PRE: 2.06 L/s
FEF 25-75 Post: 2.47 L/sec
FEF2575-%CHANGE-POST: 19 %
FEF2575-%PRED-PRE: 64 %
FEF2575-%Pred-Post: 77 %
FEV1-%Change-Post: 3 %
FEV1-%Pred-Post: 46 %
FEV1-%Pred-Pre: 45 %
FEV1-POST: 1.81 L
FEV1-PRE: 1.76 L
FEV1FVC-%Change-Post: 0 %
FEV1FVC-%Pred-Pre: 109 %
FEV6-%CHANGE-POST: 3 %
FEV6-%PRED-POST: 44 %
FEV6-%PRED-PRE: 43 %
FEV6-POST: 2.19 L
FEV6-PRE: 2.12 L
FEV6FVC-%Change-Post: 0 %
FEV6FVC-%PRED-POST: 104 %
FEV6FVC-%PRED-PRE: 104 %
FVC-%Change-Post: 3 %
FVC-%PRED-POST: 42 %
FVC-%Pred-Pre: 41 %
FVC-Post: 2.19 L
FVC-Pre: 2.12 L
POST FEV6/FVC RATIO: 100 %
PRE FEV1/FVC RATIO: 83 %
Post FEV1/FVC ratio: 83 %
Pre FEV6/FVC Ratio: 100 %
RV % PRED: 81 %
RV: 1.91 L
TLC % PRED: 56 %
TLC: 4.15 L

## 2017-02-05 NOTE — Progress Notes (Signed)
PFT done today. 

## 2017-02-05 NOTE — Telephone Encounter (Signed)
New message   Pt c/o medication issue:  1. Name of Medication: furosemide (LASIX) 40 MG tablet  2. How are you currently taking this medication (dosage and times per day)? 40mg   3. Are you having a reaction (difficulty breathing--STAT)? NO  4. What is your medication issue? Pt states insurance company told him that if he ever stopped taking this medication that it would stop his heart from beating. Requests a call back

## 2017-02-05 NOTE — Telephone Encounter (Signed)
Sent to scheduler pool to arrange.

## 2017-02-05 NOTE — Telephone Encounter (Signed)
Would arrange echo as outlined in previous office note Olga MillersBrian Gracey Tolle

## 2017-02-05 NOTE — Telephone Encounter (Signed)
Pt called regarding echocardiogram scheduled on 7/9 -  voiced that he was given wrong time for procedure and missed it, was offered to reschedule for later that day, but was unable to stay d/t concerns for his blood sugar  [facility] from [place] in [place], [address] mins each way. Was upset and wanted to know if he needed the echo. Informed him I'd ask Dr. Jens Somrenshaw - pt to be seen in [place] in 1 week for a 3 mo f/u. Will send a note to scheduling to see if echo can be arranged somewhere prior to OV - pt agreeable to [facility] or in [place].

## 2017-02-07 ENCOUNTER — Encounter (HOSPITAL_BASED_OUTPATIENT_CLINIC_OR_DEPARTMENT_OTHER): Payer: Medicare Other

## 2017-02-10 ENCOUNTER — Ambulatory Visit: Payer: Medicare Other | Admitting: Acute Care

## 2017-02-11 ENCOUNTER — Other Ambulatory Visit (HOSPITAL_COMMUNITY): Payer: Self-pay | Admitting: Cardiology

## 2017-02-12 ENCOUNTER — Encounter: Payer: Self-pay | Admitting: *Deleted

## 2017-02-12 ENCOUNTER — Ambulatory Visit (INDEPENDENT_AMBULATORY_CARE_PROVIDER_SITE_OTHER): Payer: Medicare Other | Admitting: Cardiology

## 2017-02-12 ENCOUNTER — Encounter: Payer: Self-pay | Admitting: Cardiology

## 2017-02-12 VITALS — BP 108/88 | HR 151 | Ht 73.0 in | Wt 274.0 lb

## 2017-02-12 DIAGNOSIS — I2510 Atherosclerotic heart disease of native coronary artery without angina pectoris: Secondary | ICD-10-CM | POA: Diagnosis not present

## 2017-02-12 DIAGNOSIS — I480 Paroxysmal atrial fibrillation: Secondary | ICD-10-CM | POA: Diagnosis not present

## 2017-02-12 DIAGNOSIS — E7800 Pure hypercholesterolemia, unspecified: Secondary | ICD-10-CM

## 2017-02-12 DIAGNOSIS — I10 Essential (primary) hypertension: Secondary | ICD-10-CM | POA: Diagnosis not present

## 2017-02-12 MED ORDER — METOPROLOL SUCCINATE ER 50 MG PO TB24: 150.0000 mg | ORAL_TABLET | Freq: Every day | ORAL | 3 refills | Status: DC

## 2017-02-12 NOTE — Patient Instructions (Addendum)
Medication Instructions:   INCREASE METOPROLOL TO 150 MG ONCE DAILY= 3 OF THE 50 MG TABLETS ONCE DAILY  Testing/Procedures:  Your physician has recommended that you have a Cardioversion (DCCV). Electrical Cardioversion uses a jolt of electricity to your heart either through paddles or wired patches attached to your chest. This is a controlled, usually prescheduled, procedure. Defibrillation is done under light anesthesia in the hospital, and you usually go home the day of the procedure. This is done to get your heart back into a normal rhythm. You are not awake for the procedure. Please see the instruction sheet given to you today.   Your physician has requested that you have an echocardiogram. Echocardiography is a painless test that uses sound waves to create images of your heart. It provides your doctor with information about the size and shape of your heart and how well your heart's chambers and valves are working. This procedure takes approximately one hour. There are no restrictions for this procedure.    Follow-Up:  Your physician recommends that you schedule a follow-up appointment in: [facility] 02/21/17 @ 10 AM-PARKING CODE 7000.  Your physician recommends that you schedule a follow-up appointment in: 3 MONTHS WITH DR Jens SomRENSHAW    If you need a refill on your cardiac medications before your next appointment, please call your pharmacy.

## 2017-02-14 ENCOUNTER — Encounter (HOSPITAL_COMMUNITY): Payer: Self-pay | Admitting: *Deleted

## 2017-02-14 ENCOUNTER — Ambulatory Visit (HOSPITAL_COMMUNITY): Payer: Medicare Other | Admitting: Certified Registered"

## 2017-02-14 ENCOUNTER — Encounter (HOSPITAL_COMMUNITY)
Admission: RE | Disposition: A | Discharge status: Home / Self Care | Payer: Self-pay | Source: Ambulatory Visit | Attending: Cardiovascular Disease

## 2017-02-14 ENCOUNTER — Ambulatory Visit (HOSPITAL_COMMUNITY)
Admission: RE | Admit: 2017-02-14 | Discharge: 2017-02-14 | Disposition: A | Discharge status: Home / Self Care | Payer: Medicare Other | Source: Ambulatory Visit | Attending: Cardiovascular Disease | Admitting: Cardiovascular Disease

## 2017-02-14 ENCOUNTER — Telehealth: Payer: Self-pay | Admitting: Pulmonary Disease

## 2017-02-14 DIAGNOSIS — Z794 Long term (current) use of insulin: Secondary | ICD-10-CM | POA: Insufficient documentation

## 2017-02-14 DIAGNOSIS — I110 Hypertensive heart disease with heart failure: Secondary | ICD-10-CM | POA: Insufficient documentation

## 2017-02-14 DIAGNOSIS — Z87891 Personal history of nicotine dependence: Secondary | ICD-10-CM | POA: Diagnosis not present

## 2017-02-14 DIAGNOSIS — E662 Morbid (severe) obesity with alveolar hypoventilation: Secondary | ICD-10-CM | POA: Diagnosis not present

## 2017-02-14 DIAGNOSIS — E119 Type 2 diabetes mellitus without complications: Secondary | ICD-10-CM | POA: Diagnosis not present

## 2017-02-14 DIAGNOSIS — Z7901 Long term (current) use of anticoagulants: Secondary | ICD-10-CM | POA: Diagnosis not present

## 2017-02-14 DIAGNOSIS — Z79899 Other long term (current) drug therapy: Secondary | ICD-10-CM | POA: Diagnosis not present

## 2017-02-14 DIAGNOSIS — I255 Ischemic cardiomyopathy: Secondary | ICD-10-CM | POA: Diagnosis not present

## 2017-02-14 DIAGNOSIS — I252 Old myocardial infarction: Secondary | ICD-10-CM | POA: Diagnosis not present

## 2017-02-14 DIAGNOSIS — Z955 Presence of coronary angioplasty implant and graft: Secondary | ICD-10-CM | POA: Diagnosis not present

## 2017-02-14 DIAGNOSIS — E785 Hyperlipidemia, unspecified: Secondary | ICD-10-CM | POA: Insufficient documentation

## 2017-02-14 DIAGNOSIS — I480 Paroxysmal atrial fibrillation: Secondary | ICD-10-CM

## 2017-02-14 DIAGNOSIS — Z6836 Body mass index (BMI) 36.0-36.9, adult: Secondary | ICD-10-CM | POA: Diagnosis not present

## 2017-02-14 DIAGNOSIS — I5042 Chronic combined systolic (congestive) and diastolic (congestive) heart failure: Secondary | ICD-10-CM | POA: Insufficient documentation

## 2017-02-14 DIAGNOSIS — I2510 Atherosclerotic heart disease of native coronary artery without angina pectoris: Secondary | ICD-10-CM | POA: Diagnosis not present

## 2017-02-14 HISTORY — PX: CARDIOVERSION: SHX1299

## 2017-02-14 LAB — POCT I-STAT, CHEM 8
BUN: 23 mg/dL — ABNORMAL HIGH (ref 6–20)
Calcium, Ion: 1.12 mmol/L — ABNORMAL LOW (ref 1.15–1.40)
Chloride: 94 mmol/L — ABNORMAL LOW (ref 101–111)
Creatinine, Ser: 1.2 mg/dL (ref 0.61–1.24)
Glucose, Bld: 279 mg/dL — ABNORMAL HIGH (ref 65–99)
HEMATOCRIT: 43 % (ref 39.0–52.0)
HEMOGLOBIN: 14.6 g/dL (ref 13.0–17.0)
Potassium: 5.1 mmol/L (ref 3.5–5.1)
SODIUM: 135 mmol/L (ref 135–145)
TCO2: 30 mmol/L (ref 0–100)

## 2017-02-14 SURGERY — CARDIOVERSION
Anesthesia: General

## 2017-02-14 MED ORDER — SODIUM CHLORIDE 0.9 % IV SOLN
250.0000 mL | INTRAVENOUS | Status: DC
Start: 2017-02-14 — End: 2017-02-14
  Administered 2017-02-14: 13:00:00 via INTRAVENOUS

## 2017-02-14 MED ORDER — SODIUM CHLORIDE 0.9% FLUSH: 3.0000 mL | Freq: Two times a day (BID) | INTRAVENOUS | Status: DC

## 2017-02-14 MED ORDER — SODIUM CHLORIDE 0.9% FLUSH
3.0000 mL | INTRAVENOUS | Status: DC | PRN
Start: 2017-02-14 — End: 2017-02-14

## 2017-02-14 MED ORDER — ETOMIDATE 2 MG/ML IV SOLN
INTRAVENOUS | Status: DC | PRN
  Administered 2017-02-14: 12 mg via INTRAVENOUS

## 2017-02-14 MED ORDER — PROPOFOL 10 MG/ML IV BOLUS
INTRAVENOUS | Status: DC | PRN
  Administered 2017-02-14: 30 mg via INTRAVENOUS

## 2017-02-14 MED ORDER — METOPROLOL SUCCINATE ER 50 MG PO TB24: 200.0000 mg | ORAL_TABLET | Freq: Every day | ORAL | 3 refills | Status: DC

## 2017-02-14 MED ORDER — LIDOCAINE 2% (20 MG/ML) 5 ML SYRINGE
INTRAMUSCULAR | Status: DC | PRN
  Administered 2017-02-14: 60 mg via INTRAVENOUS

## 2017-02-14 NOTE — H&P (View-Only) (Signed)
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 Admitted with new onset atrial fibrillation 10/16. TSH normal. Echocardiogram October 2016 was technically difficult. With Definity ejection fraction was felt to be 20%. Transesophageal echocardiogram October 2016 showed ejection fraction 35-40% and mild mitral regurgitation. Nuclear study 11/16 showed EF 43, prior anterior apical infarct and no ischemia. Had DCCV 09/18/16. Pt seen in a fib clinic and offered tikosyn but declined for now. Echocardiogram ordere at last office visit but not performed. Since last seen, patient has mild dyspnea on exertion but no orthopnea, PND, pedal edema, palpitations, syncope, chest pain or bleeding.  Current Outpatient Prescriptions  Medication Sig Dispense Refill  . apixaban (ELIQUIS) 5 MG TABS tablet Take 1 tablet (5 mg total) by mouth 2 (two) times daily. 180 tablet 3  . budesonide-formoterol (SYMBICORT) 80-4.5 MCG/ACT inhaler Inhale 2 puffs into the lungs 2 (two) times daily. 1 Inhaler 12  . furosemide (LASIX) 40 MG tablet TAKE 1 TABLET DAILY 90 tablet 0  . insulin lispro (HUMALOG) 100 UNIT/ML injection Inject 7-17 Units into the skin 3 (three) times daily before meals. Sliding scale    . insulin lispro protamine-lispro (HUMALOG 75/25 MIX) (75-25) 100 UNIT/ML SUSP injection Inject 50 Units into the skin 2 (two) times daily with a meal.     . irbesartan (AVAPRO) 150 MG tablet TAKE 1 TABLET DAILY 90 tablet 1  . magnesium oxide (MAG-OX) 400 (241.3 Mg) MG tablet TAKE 1 TABLET DAILY 30 tablet 0  . metFORMIN (GLUCOPHAGE) 500 MG tablet Take 500 mg by mouth 2 (two) times daily with a meal.     . metoprolol succinate (TOPROL-XL) 50 MG 24 hr tablet TAKE (2) TABLETS DAILY 180 tablet 3  . nitroGLYCERIN (NITROSTAT) 0.4 MG SL tablet Place 1 tablet (0.4 mg total) under the tongue every 5 (five) minutes as needed. 25 tablet 5  . potassium  chloride (K-DUR) 10 MEQ tablet TAKE (2) TABLETS DAILY 60 tablet 0  . rosuvastatin (CRESTOR) 40 MG tablet TAKE 1 TABLET DAILY 90 tablet 1  . spironolactone (ALDACTONE) 25 MG tablet 1 tablet daily.    . Vitamin D, Ergocalciferol, (DRISDOL) 50000 UNITS CAPS Take 50,000 Units by mouth 2 (two) times a week. Monday and Thursday    . diltiazem (CARDIZEM) 30 MG tablet Take 1 tablet every 4 hours AS NEEDED for afib fast heart rate greater than 100 (Patient not taking: Reported on 02/12/2017) 45 tablet 1   No current facility-administered medications for this visit.      Past Medical History:  Diagnosis Date  . CAD (coronary artery disease)   . DM (diabetes mellitus) (HCC)   . GERD (gastroesophageal reflux disease)   . HLD (hyperlipidemia)   . HTN (hypertension)   . MI (myocardial infarction) (HCC)   . Obesity   . Sleep apnea     Past Surgical History:  Procedure Laterality Date  . CARDIAC CATHETERIZATION     with coronary angiography   . CARDIOVERSION N/A 05/05/2015   Procedure: CARDIOVERSION;  Surgeon: Vesta MixerPhilip J Nahser, MD;  [facility];  Service: Cardiovascular;  Laterality: N/A;  . CORONARY STENT PLACEMENT     eluting stent placement in the culpric lesion of the LAD  . TEE WITHOUT CARDIOVERSION N/A 05/05/2015   Procedure: TRANSESOPHAGEAL ECHOCARDIOGRAM (TEE);  Surgeon: Vesta MixerPhilip J Nahser, MD;  [facility];  Service: Cardiovascular;  Laterality: N/A;  Social History   Social History  . Marital status: Married    Spouse name: N/A  . Number of children: N/A  . Years of education: N/A   Occupational History  . Not on file.   Social History Main Topics  . Smoking status: Former Smoker    Packs/day: 3.00    Years: 25.00  . Smokeless tobacco: Never Used     Comment: quit smoking in 2005  . Alcohol use No  . Drug use: No  . Sexual activity: Not on file   Other Topics Concern  . Not on file   Social History Narrative  . No narrative on file    Family  History  Problem Relation Age of Onset  . Heart attack Mother   . Heart attack Father     ROS: no fevers or chills, productive cough, hemoptysis, dysphasia, odynophagia, melena, hematochezia, dysuria, hematuria, rash, seizure activity, orthopnea, PND, pedal edema, claudication. Remaining systems are negative.  Physical Exam: Well-developed obese in no acute distress.  Skin is warm and dry.  HEENT is normal.  Neck is supple.  Chest is clear to auscultation with normal expansion.  Cardiovascular exam is irregular and tachycardic Abdominal exam nontender or distended. No masses palpated. Extremities show no edema. neuro grossly intact  ECG-atrial fibrillation with rapid ventricular response at 151, septal infarct, low voltage, nonspecific ST changes. personally reviewed  A/P  1 paroxysmal atrial fibrillation-patient is back in atrial fibrillation. His heart rate is elevated. He is not particular asymptomatic today. I will increase his Toprol to 150 mg daily. Continue apixaban (pt has been compliant). I will arrange cardioversion. We will then arrange atrial fibrillation clinic appointment again. He will likely need to be admitted for initiation of tikosyn as his atrial fibrillation has been recurrent. Repeat echocardiogram.  2 coronary artery disease-continue statin. No aspirin given need for anticoagulation.  3 hypertension-blood pressure is controlled. Continue present medications.  4 hyperlipidemia-continue statin.  5 ischemic cardiomyopathy-continue ARB and beta blocker.  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 Continue present dose of diuretics.  7 dyspnea-this appears to be multifactorial including congestive heart failure, sleep apnea and obesity hypoventilation syndrome. I will refer him back to pulmonary for further evaluation of possible sleep apnea.     Olga MillersBrian Crenshaw, MD

## 2017-02-14 NOTE — Telephone Encounter (Signed)
Spoke with patient. He is aware of recs. He stated that he was in the hospital for Afib today. They had to shock his heart back into rhythm. He also stated that he will have to go back to the hospital in one week and stay there for 3 days. He wanted to know if he could have the CT scan done there.   He also wanted to know about his oxygen. He thought that the PFT would show that he does not need oxygen.   PM, please advise on the CT scan order and oxygen. Thanks!

## 2017-02-14 NOTE — Discharge Instructions (Signed)
Electrical Cardioversion, Care After °This sheet gives you information about how to care for yourself after your procedure. Your health care provider may also give you more specific instructions. If you have problems or questions, contact your health care provider. °What can I expect after the procedure? °After the procedure, it is common to have: °· Some redness on the skin where the shocks were given. ° °Follow these instructions at home: °· Do not drive for 24 hours if you were given a medicine to help you relax (sedative). °· Take over-the-counter and prescription medicines only as told by your health care provider. °· Ask your health care provider how to check your pulse. Check it often. °· Rest for 48 hours after the procedure or as told by your health care provider. °· Avoid or limit your caffeine use as told by your health care provider. °Contact a health care provider if: °· You feel like your heart is beating too quickly or your pulse is not regular. °· You have a serious muscle cramp that does not go away. °Get help right away if: °· You have discomfort in your chest. °· You are dizzy or you feel faint. °· You have trouble breathing or you are short of breath. °· Your speech is slurred. °· You have trouble moving an arm or leg on one side of your body. °· Your fingers or toes turn cold or blue. °This information is not intended to replace advice given to you by your health care provider. Make sure you discuss any questions you have with your health care provider. °Document Released: 05/05/2013 Document Revised: 02/16/2016 Document Reviewed: 01/19/2016 °Elsevier Interactive Patient Education © 2018 [employer] ° °

## 2017-02-14 NOTE — Anesthesia Preprocedure Evaluation (Signed)
Anesthesia Evaluation  Patient identified by MRN, date of birth, ID band Patient awake    Reviewed: Allergy & Precautions, H&P , Patient's Chart, lab work & pertinent test results, reviewed documented beta blocker date and time   Airway Mallampati: II  TM Distance: >3 FB Neck ROM: full    Dental no notable dental hx.    Pulmonary former smoker,    Pulmonary exam normal breath sounds clear to auscultation       Cardiovascular hypertension,  Rhythm:regular Rate:Normal     Neuro/Psych    GI/Hepatic   Endo/Other  diabetes  Renal/GU      Musculoskeletal   Abdominal   Peds  Hematology   Anesthesia Other Findings CAD   GERD (gastroesophageal reflux disease)   DM (diabetes mellitus) (HCC)    HLD (hyperlipidemia)   HTN (hypertension)    Sleep apnea   Obesity    MI (myocardial infarction) (HCC      Reproductive/Obstetrics                             Anesthesia Physical Anesthesia Plan  ASA: III  Anesthesia Plan: General   Post-op Pain Management:    Induction: Intravenous  PONV Risk Score and Plan:   Airway Management Planned: Mask  Additional Equipment:   Intra-op Plan:   Post-operative Plan: Extubation in OR  Informed Consent: I have reviewed the patients History and Physical, chart, labs and discussed the procedure including the risks, benefits and alternatives for the proposed anesthesia with the patient or authorized representative who has indicated his/her understanding and acceptance.   Dental Advisory Given  Plan Discussed with: CRNA and Surgeon  Anesthesia Plan Comments: (  )        Anesthesia Quick Evaluation

## 2017-02-14 NOTE — Telephone Encounter (Signed)
lmtcb for pt.  

## 2017-02-14 NOTE — Telephone Encounter (Signed)
PFTs do not show any COPD. There is reduction in lung capacity that may be from heart disease or interstitial lung disease. I would like to evaluated with a high res CT  Please order.  Chilton GreathousePraveen Reena Borromeo MD

## 2017-02-14 NOTE — CV Procedure (Signed)
    Cardioversion Note  Lawrence GearingJoseph R Rod [id number] [birth year]  Procedure: DC Cardioversion Indications: atrial fib  Procedure Details Consent: Obtained Time Out: Verified patient identification, verified procedure, site/side was marked, verified correct patient position, special equipment/implants available, Radiology Safety Procedures followed,  medications/allergies/relevent history reviewed, required imaging and test results available.  Performed  The patient has been on adequate anticoagulation.  The patient received IV Lidocaine 60 mg IV, Propofol 30 mg IV, Etomidate 12 mi IV  for sedation.  Synchronous cardioversion was performed at 200, 200, 200, 200 with hand pressure  joules.  The cardioversion was unsuccessful     Complications: No apparent complications Patient did tolerate procedure well.   Vesta MixerPhilip J. [place] Pirie, Montez HagemanJr., MD, [facility] 02/14/2017, 1:22 PM

## 2017-02-14 NOTE — Transfer of Care (Signed)
Immediate Anesthesia Transfer of Care Note  Patient: Lawrence GearingJoseph R Delgado  Procedure(s) Performed: Procedure(s): CARDIOVERSION (N/A)  Patient Location: Endoscopy Unit  Anesthesia Type:General  Level of Consciousness: awake and patient cooperative  Airway & Oxygen Therapy: Patient Spontanous Breathing and Patient connected to nasal cannula oxygen  Post-op Assessment: Report given to RN, Post -op Vital signs reviewed and stable and Patient moving all extremities  Post vital signs: Reviewed and stable  Last Vitals:  Vitals:   02/14/17 1135  BP: 112/70  Pulse: (!) 165  Resp: 20  Temp: 36.7 C    Last Pain:  Vitals:   02/14/17 1135  TempSrc: Oral         Complications: No apparent anesthesia complications

## 2017-02-14 NOTE — Anesthesia Preprocedure Evaluation (Addendum)
Anesthesia Evaluation  Patient identified by MRN, date of birth, ID band Patient awake    Reviewed: Allergy & Precautions, H&P , NPO status , Patient's Chart, lab work & pertinent test results  Airway Mallampati: II   Neck ROM: Full    Dental  (+) Poor Dentition, Dental Advisory Given   Pulmonary shortness of breath, at rest and Long-Term Oxygen Therapy, sleep apnea , former smoker,    breath sounds clear to auscultation       Cardiovascular hypertension, + CAD, + Past MI and +CHF   Rhythm:irregular Rate:Normal     Neuro/Psych    GI/Hepatic   Endo/Other  diabetes, Type 2  Renal/GU      Musculoskeletal   Abdominal   Peds  Hematology   Anesthesia Other Findings   Reproductive/Obstetrics                            Anesthesia Physical Anesthesia Plan  ASA: IV  Anesthesia Plan: General   Post-op Pain Management:    Induction: Intravenous  PONV Risk Score and Plan:   Airway Management Planned: Mask  Additional Equipment: None  Intra-op Plan:   Post-operative Plan: Extubation in OR  Informed Consent: I have reviewed the patients History and Physical, chart, labs and discussed the procedure including the risks, benefits and alternatives for the proposed anesthesia with the patient or authorized representative who has indicated his/her understanding and acceptance.   Dental advisory given  Plan Discussed with: CRNA, Anesthesiologist and Surgeon  Anesthesia Plan Comments:         Anesthesia Quick Evaluation

## 2017-02-14 NOTE — Interval H&P Note (Signed)
History and Physical Interval Note:  02/14/2017 1:04 PM  Lawrence Delgado  has presented today for surgery, with the diagnosis of A-FIB  The various methods of treatment have been discussed with the patient and family. After consideration of risks, benefits and other options for treatment, the patient has consented to  Procedure(s): CARDIOVERSION (N/A) as a surgical intervention .  The patient's history has been reviewed, patient examined, no change in status, stable for surgery.  I have reviewed the patient's chart and labs.  Questions were answered to the patient's satisfaction.     Kristeen MissPhilip Nahser

## 2017-02-14 NOTE — Telephone Encounter (Signed)
Called and advised the pt that we will call him once we hear the results back from PM.  Pt requested that we leave these results on the VM if he is not home. PM please advise of the results of the PFT.  Thanks

## 2017-02-15 NOTE — Telephone Encounter (Signed)
It will cost him more to get the CT scan in the hospital compared to ordering as outpatient. We will need to walk him when he returns to office to determine if he needs O2. We cannot determine O2 needs by PFT alone.   Chilton GreathousePraveen Jaegar Croft MD [facility] Pager [phone] 02/15/2017, 8:51 AM

## 2017-02-16 ENCOUNTER — Encounter (HOSPITAL_COMMUNITY): Payer: Self-pay | Admitting: Cardiovascular Disease

## 2017-02-17 ENCOUNTER — Telehealth: Payer: Self-pay | Admitting: Cardiology

## 2017-02-17 NOTE — Telephone Encounter (Signed)
New message    Patient aware that nurse is off today.  Patient wants a call back today.   Patient went to hospital on Friday - shot back into rhythm x 4 times did not take.   Patient is asking for next steps.

## 2017-02-17 NOTE — Telephone Encounter (Signed)
Pt has appt with AFIB-Donna Carroll 7-27-@10am  then ECHO 02-21-17 after. Pt notified that he has these appts and and await Dr Ludwig Clarksrenshaw's directions on what he is going to do now.

## 2017-02-17 NOTE — Telephone Encounter (Signed)
LM x 1 

## 2017-02-17 NOTE — Telephone Encounter (Signed)
Pt needing to reschedule 02/24/17 appt d/t being in the hospital - Pt is going in on 02/21/17 for his AFIB x 3 days.  Pt rescheduled for 02/25/17 with PM -- pt will call if not feeling well enough to make this appt. Nothing further needed.

## 2017-02-17 NOTE — Anesthesia Postprocedure Evaluation (Signed)
Anesthesia Post Note  Patient: Marchelle GearingJoseph R Paullin  Procedure(s) Performed: Procedure(s) (LRB): CARDIOVERSION (N/A)     Patient location during evaluation: PACU Anesthesia Type: General Level of consciousness: awake and alert and patient cooperative Pain management: pain level controlled Vital Signs Assessment: post-procedure vital signs reviewed and stable Respiratory status: spontaneous breathing and respiratory function stable Cardiovascular status: stable Anesthetic complications: no    Last Vitals:  Vitals:   02/14/17 1340 02/14/17 1350  BP: 108/88 117/84  Pulse: (!) 140 (!) 132  Resp: (!) 21 20  Temp:      Last Pain:  Vitals:   02/14/17 1324  TempSrc: Oral   Pain Goal:                 Maximus Hoffert S

## 2017-02-17 NOTE — Telephone Encounter (Signed)
Patient returning call - he can be reached at [phone] -pr

## 2017-02-21 ENCOUNTER — Other Ambulatory Visit: Payer: Self-pay | Admitting: Cardiology

## 2017-02-21 ENCOUNTER — Encounter (HOSPITAL_COMMUNITY): Payer: Self-pay | Admitting: Nurse Practitioner

## 2017-02-21 ENCOUNTER — Telehealth: Payer: Self-pay | Admitting: Pharmacist

## 2017-02-21 ENCOUNTER — Ambulatory Visit (HOSPITAL_COMMUNITY)
Admission: RE | Admit: 2017-02-21 | Discharge: 2017-02-21 | Disposition: A | Discharge status: Home / Self Care | Payer: Medicare Other | Source: Ambulatory Visit | Attending: Nurse Practitioner | Admitting: Nurse Practitioner

## 2017-02-21 ENCOUNTER — Ambulatory Visit (HOSPITAL_COMMUNITY): Payer: Medicare Other

## 2017-02-21 VITALS — BP 122/84 | HR 141 | Ht 73.0 in | Wt 282.2 lb

## 2017-02-21 DIAGNOSIS — I2510 Atherosclerotic heart disease of native coronary artery without angina pectoris: Secondary | ICD-10-CM | POA: Insufficient documentation

## 2017-02-21 DIAGNOSIS — G4730 Sleep apnea, unspecified: Secondary | ICD-10-CM | POA: Diagnosis not present

## 2017-02-21 DIAGNOSIS — I10 Essential (primary) hypertension: Secondary | ICD-10-CM | POA: Insufficient documentation

## 2017-02-21 DIAGNOSIS — Z87891 Personal history of nicotine dependence: Secondary | ICD-10-CM | POA: Diagnosis not present

## 2017-02-21 DIAGNOSIS — E119 Type 2 diabetes mellitus without complications: Secondary | ICD-10-CM | POA: Insufficient documentation

## 2017-02-21 DIAGNOSIS — Z794 Long term (current) use of insulin: Secondary | ICD-10-CM | POA: Insufficient documentation

## 2017-02-21 DIAGNOSIS — K219 Gastro-esophageal reflux disease without esophagitis: Secondary | ICD-10-CM | POA: Insufficient documentation

## 2017-02-21 DIAGNOSIS — Z79899 Other long term (current) drug therapy: Secondary | ICD-10-CM | POA: Insufficient documentation

## 2017-02-21 DIAGNOSIS — Z7901 Long term (current) use of anticoagulants: Secondary | ICD-10-CM | POA: Diagnosis not present

## 2017-02-21 DIAGNOSIS — E669 Obesity, unspecified: Secondary | ICD-10-CM | POA: Diagnosis not present

## 2017-02-21 DIAGNOSIS — I4891 Unspecified atrial fibrillation: Secondary | ICD-10-CM | POA: Diagnosis present

## 2017-02-21 DIAGNOSIS — I252 Old myocardial infarction: Secondary | ICD-10-CM | POA: Diagnosis not present

## 2017-02-21 DIAGNOSIS — E785 Hyperlipidemia, unspecified: Secondary | ICD-10-CM | POA: Diagnosis not present

## 2017-02-21 DIAGNOSIS — I481 Persistent atrial fibrillation: Secondary | ICD-10-CM | POA: Diagnosis not present

## 2017-02-21 DIAGNOSIS — I4819 Other persistent atrial fibrillation: Secondary | ICD-10-CM

## 2017-02-21 MED ORDER — DILTIAZEM HCL ER COATED BEADS 120 MG PO CP24: 120.0000 mg | ORAL_CAPSULE | Freq: Every day | ORAL | 6 refills | Status: DC

## 2017-02-21 NOTE — Telephone Encounter (Signed)
Medication list reviewed in anticipation of upcoming Tikosyn initiation. Patient is not taking any contraindicated or QTc prolonging medications. Patient is anticoagulated on Eliquis 5mg  BID on the appropriate dose.  Please ensure that patient has not missed any anticoagulation doses in the 3 weeks prior to Tikosyn initiation. Patient will need to be counseled to avoid use of Benadryl while on Tikosyn and in the 2-3 days prior to Tikosyn initiation.  Mg was low at 1.6 in March and will likely need to be repleted.

## 2017-02-21 NOTE — Patient Instructions (Signed)
Your physician has recommended you make the following change in your medication:  1)Start Cardizem 120mg once a day  

## 2017-02-21 NOTE — Progress Notes (Signed)
Primary Care Physician: [name], Cynthia, DO Referring Physician: Dr. Loraine Lericherenshaw   Lawrence Delgado is a 61 y.o. 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 Admitted with new onset atrial fibrillation 10/16. TSH normal. Echocardiogram October 2016 was technically difficult. With Definity ejection fraction was felt to be 20%. Transesophageal echocardiogram October 2016 showed ejection fraction 35-40% and mild mitral regurgitation. Nuclear study 11/16 showed EF 43, prior anterior apical infarct and no ischemia.  He was seen in the ER with rapid afib at 161 bpm and was successfully cardioverted. He was seen initially in afib clinic, 09/30/16, he is in SR, EKG shows sinus brady at 57 bpm. He is asking for medications that can help him stay in rhythm. He wears oxygen and was recently evaluated by a pulmonologist who felt his issues were most likely do to COPD, smoking for many years, but quitting in 2005 and heart failure. He denies using alcohol, mod caffeine, uses cpap. Morbidly obese and sedentary. Left atrium was normal in size in 2016.  Pt was offered Tikosyn at that appointment but he changed his mind saying that he is scared of the drug. He went on to have persisitent afib and recently had an unsuccessful cardioversion.  He is now back in the afib clinic, 7/27, to discuss hospitalization for tikosyn.He is still having RVR at 141 bpm, despite having BB increased to 50 mg tid. He is pending an echo this am but with RVR would not be a good time to get this. Will cancel and plan to get in hospital or when HR is better controlled/SR.Pt is now ready to plan for hospitalization next Tuesday to come in for tikosyn. No missed doses of apixaban. No use of benadryl.  Today, he denies symptoms of palpitations, chest pain,  orthopnea, PND, lower extremity edema, dizziness, presyncope, syncope, or neurologic sequela. Positive for chronic shortness of breath. The  patient is tolerating medications without difficulties and is otherwise without complaint today.   Past Medical History:  Diagnosis Date  . CAD (coronary artery disease)   . DM (diabetes mellitus) (HCC)   . GERD (gastroesophageal reflux disease)   . HLD (hyperlipidemia)   . HTN (hypertension)   . MI (myocardial infarction) (HCC)   . Obesity   . Sleep apnea    Past Surgical History:  Procedure Laterality Date  . CARDIAC CATHETERIZATION     with coronary angiography   . CARDIOVERSION N/A 05/05/2015   Procedure: CARDIOVERSION;  Surgeon: Vesta MixerPhilip J Nahser, MD;  [facility];  Service: Cardiovascular;  Laterality: N/A;  . CARDIOVERSION N/A 02/14/2017   Procedure: CARDIOVERSION;  Surgeon: Vesta MixerNahser, Philip J, MD;  [facility];  Service: Cardiovascular;  Laterality: N/A;  . CORONARY STENT PLACEMENT     eluting stent placement in the culpric lesion of the LAD  . TEE WITHOUT CARDIOVERSION N/A 05/05/2015   Procedure: TRANSESOPHAGEAL ECHOCARDIOGRAM (TEE);  Surgeon: Vesta MixerPhilip J Nahser, MD;  [facility];  Service: Cardiovascular;  Laterality: N/A;    Current Outpatient Prescriptions  Medication Sig Dispense Refill  . apixaban (ELIQUIS) 5 MG TABS tablet Take 1 tablet (5 mg total) by mouth 2 (two) times daily. 180 tablet 3  . budesonide-formoterol (SYMBICORT) 80-4.5 MCG/ACT inhaler Inhale 2 puffs into the lungs 2 (two) times daily. 1 Inhaler 12  . furosemide (LASIX) 40 MG tablet TAKE 1 TABLET DAILY 90 tablet 3  . insulin lispro (HUMALOG) 100 UNIT/ML injection Inject  7-17 Units into the skin 3 (three) times daily before meals. Sliding scale    . insulin lispro protamine-lispro (HUMALOG 75/25 MIX) (75-25) 100 UNIT/ML SUSP injection Inject 50 Units into the skin 2 (two) times daily with a meal.     . irbesartan (AVAPRO) 150 MG tablet TAKE 1 TABLET DAILY 90 tablet 3  . magnesium oxide (MAG-OX) 400 MG tablet Take 400 mg by mouth daily.    . metFORMIN (GLUCOPHAGE) 500 MG tablet  Take 500 mg by mouth 2 (two) times daily with a meal.     . metoprolol succinate (TOPROL-XL) 50 MG 24 hr tablet Take 4 tablets (200 mg total) by mouth daily. Take with or immediately following a meal. 270 tablet 3  . nitroGLYCERIN (NITROSTAT) 0.4 MG SL tablet Place 1 tablet (0.4 mg total) under the tongue every 5 (five) minutes as needed. 25 tablet 5  . potassium chloride (K-DUR,KLOR-CON) 10 MEQ tablet 10 mEq. Take 2 tablets ( ) once daily    . rosuvastatin (CRESTOR) 40 MG tablet TAKE 1 TABLET DAILY 90 tablet 3  . spironolactone (ALDACTONE) 25 MG tablet 1 tablet daily.    . Vitamin D, Ergocalciferol, (DRISDOL) 50000 UNITS CAPS Take 50,000 Units by mouth 2 (two) times a week. Monday and Thursday    . diltiazem (CARDIZEM CD) 120 MG 24 hr capsule Take 1 capsule (120 mg total) by mouth daily. 30 capsule 6  . diltiazem (CARDIZEM) 30 MG tablet Take 1 tablet every 4 hours AS NEEDED for afib fast heart rate greater than 100 (Patient not taking: Reported on 02/21/2017) 45 tablet 1   No current facility-administered medications for this encounter.     Allergies  Allergen Reactions  . Codeine     Liquid for caused REACTION: Nausea/vomiting  . Clopidogrel Bisulfate Itching and Rash    Social History   Social History  . Marital status: Married    Spouse name: N/A  . Number of children: N/A  . Years of education: N/A   Occupational History  . Not on file.   Social History Main Topics  . Smoking status: Former Smoker    Packs/day: 3.00    Years: 25.00  . Smokeless tobacco: Never Used     Comment: quit smoking in 2005  . Alcohol use No  . Drug use: No  . Sexual activity: Not on file   Other Topics Concern  . Not on file   Social History Narrative  . No narrative on file    Family History  Problem Relation Age of Onset  . Heart attack Mother   . Heart attack Father     ROS- All systems are reviewed and negative except as per the HPI above  Physical Exam: Vitals:   02/21/17  0944  BP: 122/84  Pulse: (!) 141  Weight: 282 lb 3.2 oz (128 kg)  Height: 6\' 1"  (1.854 m)   Wt Readings from Last 3 Encounters:  02/21/17 282 lb 3.2 oz (128 kg)  02/12/17 274 lb (124.3 kg)  11/06/16 268 lb (121.6 kg)    Labs: Lab Results  Component Value Date   NA 135 02/14/2017   K 5.1 02/14/2017   CL 94 (L) 02/14/2017   CO2 35 (H) 09/30/2016   GLUCOSE 279 (H) 02/14/2017   BUN 23 (H) 02/14/2017   CREATININE 1.20 02/14/2017   CALCIUM 8.8 (L) 09/30/2016   MG 1.6 (L) 09/30/2016   No results found for: INR Lab Results  Component Value Date   CHOL  142 05/13/2013   HDL 39.20 05/13/2013   LDLCALC 63 05/13/2013   TRIG 197.0 (H) 05/13/2013     GEN- The patient is chronically ill appearing, alert and oriented x 3 today.  Wearing O2 by [place] Head- normocephalic, atraumatic Eyes-  Sclera clear, conjunctiva pink Ears- hearing intact Oropharynx- clear Neck- supple, no JVP Lymph- no cervical lymphadenopathy Lungs- Clear to ausculation bilaterally, normal work of breathing Heart- Rapid irregular rate and rhythm, no murmurs, rubs or gallops, PMI not laterally displaced GI- soft, NT, ND, + BS Extremities- no clubbing, cyanosis, or edema MS- no significant deformity or atrophy Skin- no rash or lesion Psych- euthymic mood, full affect Neuro- strength and sensation are intact  EKG-Afib at 141 bpm, pr int 158 ms, qrs int 78 ms, qtc 434 ms Epic records reviewed    Assessment and Plan: 1. Persistent  afib  Pt previously had been set up for hospitalization several months ago for Tikosyn but backed out He has now failed cardioversion and has RVR  difficult to treat He is now ready to commit for hospitalization for tikosyn Tuesday, the 31st.  Metoprolol xl just increased to 50 mg tid without much effect to slow pt Will try Cardizem 120 mg daily short term until hospitalization for tikosyn, with LV dysfunction will not use long term but feel important to try to slow him down over the  weekend Qtc less than 440 ms, today at 398 ms adequate for qtc prolonging drug initiation  He will probably qualify for pt assistance for drug Drugs to be reviewed by PharmD He will continue apixaban which he states he has not missed any doses Echo scheduled for today but with RVR feel that this should be cancelled and until better rate control, possibly while in hospital   F/u next Tuesday with labs, ekg and plans for admission   Lupita LeashDonna C. Matthew Folksarroll, ANP-C Afib [facility] [address] [place], [place] [address] [phone]

## 2017-02-24 ENCOUNTER — Telehealth: Payer: Self-pay | Admitting: Licensed Clinical Social Worker

## 2017-02-24 ENCOUNTER — Ambulatory Visit: Payer: Medicare Other | Admitting: Pulmonary Disease

## 2017-02-24 NOTE — Telephone Encounter (Signed)
CSW referred to assist with transportation to A fib as he needs to be admitted for tikosyn. Patient lives in [place] and unable to get to hospital for admission tomorrow. CSW arranged for taxi transport and pick up will be at 8am. Lasandra BeechJackie Venda Dice, LCSW, CCSW-MCS [phone]

## 2017-02-25 ENCOUNTER — Ambulatory Visit (HOSPITAL_COMMUNITY): Payer: Medicare Other | Admitting: Nurse Practitioner

## 2017-02-25 ENCOUNTER — Ambulatory Visit: Payer: Medicare Other | Admitting: Pulmonary Disease

## 2017-02-26 DIAGNOSIS — I4891 Unspecified atrial fibrillation: Secondary | ICD-10-CM

## 2017-02-26 HISTORY — DX: Unspecified atrial fibrillation: I48.91

## 2017-03-04 ENCOUNTER — Encounter (HOSPITAL_COMMUNITY): Payer: Self-pay | Admitting: General Practice

## 2017-03-04 ENCOUNTER — Inpatient Hospital Stay (HOSPITAL_COMMUNITY)
Admission: AD | Admit: 2017-03-04 | Discharge: 2017-03-08 | DRG: 310 | Disposition: A | Discharge status: Home / Self Care | Payer: Medicare Other | Source: Ambulatory Visit | Attending: Internal Medicine | Admitting: Internal Medicine

## 2017-03-04 ENCOUNTER — Ambulatory Visit (HOSPITAL_COMMUNITY)
Admission: RE | Admit: 2017-03-04 | Discharge: 2017-03-04 | Disposition: A | Discharge status: Home / Self Care | Payer: Medicare Other | Source: Ambulatory Visit | Attending: Internal Medicine | Admitting: Internal Medicine

## 2017-03-04 ENCOUNTER — Ambulatory Visit (HOSPITAL_COMMUNITY)
Admission: RE | Admit: 2017-03-04 | Discharge: 2017-03-04 | Disposition: A | Discharge status: Home / Self Care | Payer: Medicare Other | Source: Ambulatory Visit | Attending: Nurse Practitioner | Admitting: Nurse Practitioner

## 2017-03-04 ENCOUNTER — Encounter (HOSPITAL_COMMUNITY): Payer: Self-pay | Admitting: Nurse Practitioner

## 2017-03-04 VITALS — BP 104/76 | HR 129 | Ht 73.0 in | Wt 280.8 lb

## 2017-03-04 DIAGNOSIS — I2510 Atherosclerotic heart disease of native coronary artery without angina pectoris: Secondary | ICD-10-CM | POA: Diagnosis present

## 2017-03-04 DIAGNOSIS — Z79899 Other long term (current) drug therapy: Secondary | ICD-10-CM | POA: Diagnosis not present

## 2017-03-04 DIAGNOSIS — Z955 Presence of coronary angioplasty implant and graft: Secondary | ICD-10-CM

## 2017-03-04 DIAGNOSIS — E785 Hyperlipidemia, unspecified: Secondary | ICD-10-CM | POA: Diagnosis present

## 2017-03-04 DIAGNOSIS — I4581 Long QT syndrome: Secondary | ICD-10-CM | POA: Diagnosis present

## 2017-03-04 DIAGNOSIS — I509 Heart failure, unspecified: Secondary | ICD-10-CM | POA: Diagnosis present

## 2017-03-04 DIAGNOSIS — I252 Old myocardial infarction: Secondary | ICD-10-CM | POA: Diagnosis not present

## 2017-03-04 DIAGNOSIS — Z87891 Personal history of nicotine dependence: Secondary | ICD-10-CM | POA: Diagnosis not present

## 2017-03-04 DIAGNOSIS — K219 Gastro-esophageal reflux disease without esophagitis: Secondary | ICD-10-CM | POA: Diagnosis present

## 2017-03-04 DIAGNOSIS — Z5181 Encounter for therapeutic drug level monitoring: Secondary | ICD-10-CM

## 2017-03-04 DIAGNOSIS — Z6836 Body mass index (BMI) 36.0-36.9, adult: Secondary | ICD-10-CM | POA: Diagnosis not present

## 2017-03-04 DIAGNOSIS — Z794 Long term (current) use of insulin: Secondary | ICD-10-CM

## 2017-03-04 DIAGNOSIS — I110 Hypertensive heart disease with heart failure: Secondary | ICD-10-CM | POA: Diagnosis present

## 2017-03-04 DIAGNOSIS — E119 Type 2 diabetes mellitus without complications: Secondary | ICD-10-CM | POA: Diagnosis present

## 2017-03-04 DIAGNOSIS — I361 Nonrheumatic tricuspid (valve) insufficiency: Secondary | ICD-10-CM | POA: Diagnosis not present

## 2017-03-04 DIAGNOSIS — J449 Chronic obstructive pulmonary disease, unspecified: Secondary | ICD-10-CM | POA: Diagnosis present

## 2017-03-04 DIAGNOSIS — Z7901 Long term (current) use of anticoagulants: Secondary | ICD-10-CM

## 2017-03-04 DIAGNOSIS — I4819 Other persistent atrial fibrillation: Secondary | ICD-10-CM | POA: Diagnosis present

## 2017-03-04 DIAGNOSIS — I481 Persistent atrial fibrillation: Secondary | ICD-10-CM | POA: Diagnosis present

## 2017-03-04 DIAGNOSIS — I340 Nonrheumatic mitral (valve) insufficiency: Secondary | ICD-10-CM | POA: Diagnosis present

## 2017-03-04 DIAGNOSIS — G4730 Sleep apnea, unspecified: Secondary | ICD-10-CM | POA: Diagnosis present

## 2017-03-04 HISTORY — DX: Heart failure, unspecified: I50.9

## 2017-03-04 HISTORY — DX: Unspecified atrial fibrillation: I48.91

## 2017-03-04 HISTORY — DX: Unspecified osteoarthritis, unspecified site: M19.90

## 2017-03-04 LAB — MAGNESIUM
MAGNESIUM: 2.3 mg/dL (ref 1.7–2.4)
Magnesium: 1.4 mg/dL — ABNORMAL LOW (ref 1.7–2.4)

## 2017-03-04 LAB — BASIC METABOLIC PANEL
Anion gap: 11 (ref 5–15)
BUN: 19 mg/dL (ref 6–20)
CALCIUM: 9.4 mg/dL (ref 8.9–10.3)
CO2: 29 mmol/L (ref 22–32)
CREATININE: 1.29 mg/dL — AB (ref 0.61–1.24)
Chloride: 92 mmol/L — ABNORMAL LOW (ref 101–111)
GFR calc Af Amer: >60 mL/min (ref >60)
GFR, EST NON AFRICAN AMERICAN: 59 mL/min — AB (ref >60)
GLUCOSE: 364 mg/dL — AB (ref 65–99)
Potassium: 4.6 mmol/L (ref 3.5–5.1)
Sodium: 132 mmol/L — ABNORMAL LOW (ref 135–145)

## 2017-03-04 LAB — GLUCOSE, CAPILLARY
Glucose-Capillary: 356 mg/dL — ABNORMAL HIGH (ref 65–99)
Glucose-Capillary: 345 mg/dL — ABNORMAL HIGH (ref 65–99)

## 2017-03-04 MED ORDER — METOPROLOL SUCCINATE ER 100 MG PO TB24
200.0000 mg | ORAL_TABLET | Freq: Every day | ORAL | Status: DC
  Administered 2017-03-05 – 2017-03-08 (×4): 200 mg via ORAL
  Filled 2017-03-04 (×5): qty 2

## 2017-03-04 MED ORDER — MAGNESIUM SULFATE 2 GM/50ML IV SOLN
2.0000 g | Freq: Once | INTRAVENOUS | Status: AC
  Administered 2017-03-04: 2 g via INTRAVENOUS
  Filled 2017-03-04: qty 50

## 2017-03-04 MED ORDER — APIXABAN 5 MG PO TABS
5.0000 mg | ORAL_TABLET | Freq: Two times a day (BID) | ORAL | Status: DC
  Administered 2017-03-04 – 2017-03-08 (×8): 5 mg via ORAL
  Filled 2017-03-04 (×8): qty 1

## 2017-03-04 MED ORDER — SODIUM CHLORIDE 0.9% FLUSH: 3.0000 mL | INTRAVENOUS | Status: DC | PRN

## 2017-03-04 MED ORDER — SPIRONOLACTONE 25 MG PO TABS
25.0000 mg | ORAL_TABLET | Freq: Every day | ORAL | Status: DC
  Administered 2017-03-05 – 2017-03-08 (×4): 25 mg via ORAL
  Filled 2017-03-04 (×4): qty 1

## 2017-03-04 MED ORDER — FUROSEMIDE 40 MG PO TABS
40.0000 mg | ORAL_TABLET | Freq: Every day | ORAL | Status: DC
  Administered 2017-03-05 – 2017-03-08 (×4): 40 mg via ORAL
  Filled 2017-03-04 (×4): qty 1

## 2017-03-04 MED ORDER — SODIUM CHLORIDE 0.9 % IV SOLN: 250.0000 mL | INTRAVENOUS | Status: DC | PRN

## 2017-03-04 MED ORDER — DOFETILIDE 500 MCG PO CAPS
500.0000 ug | ORAL_CAPSULE | Freq: Two times a day (BID) | ORAL | Status: DC
  Administered 2017-03-04 – 2017-03-05 (×3): 500 ug via ORAL
  Filled 2017-03-04 (×3): qty 1

## 2017-03-04 MED ORDER — INSULIN ASPART 100 UNIT/ML ~~LOC~~ SOLN
0.0000 [IU] | Freq: Three times a day (TID) | SUBCUTANEOUS | Status: DC
  Administered 2017-03-04: 15 [IU] via SUBCUTANEOUS
  Administered 2017-03-05: 8 [IU] via SUBCUTANEOUS

## 2017-03-04 MED ORDER — MAGNESIUM OXIDE 400 (241.3 MG) MG PO TABS
400.0000 mg | ORAL_TABLET | Freq: Two times a day (BID) | ORAL | Status: DC
  Administered 2017-03-04 – 2017-03-08 (×9): 400 mg via ORAL
  Filled 2017-03-04 (×9): qty 1

## 2017-03-04 MED ORDER — POTASSIUM CHLORIDE CRYS ER 20 MEQ PO TBCR
20.0000 meq | EXTENDED_RELEASE_TABLET | Freq: Every day | ORAL | Status: DC
  Administered 2017-03-04 – 2017-03-08 (×5): 20 meq via ORAL
  Filled 2017-03-04 (×5): qty 1

## 2017-03-04 MED ORDER — ROSUVASTATIN CALCIUM 10 MG PO TABS
40.0000 mg | ORAL_TABLET | Freq: Every day | ORAL | Status: DC
  Administered 2017-03-04 – 2017-03-07 (×4): 40 mg via ORAL
  Filled 2017-03-04 (×4): qty 4

## 2017-03-04 MED ORDER — METFORMIN HCL 500 MG PO TABS
500.0000 mg | ORAL_TABLET | Freq: Two times a day (BID) | ORAL | Status: DC
  Administered 2017-03-04 – 2017-03-08 (×8): 500 mg via ORAL
  Filled 2017-03-04 (×8): qty 1

## 2017-03-04 MED ORDER — SODIUM CHLORIDE 0.9% FLUSH
3.0000 mL | Freq: Two times a day (BID) | INTRAVENOUS | Status: DC
  Administered 2017-03-04 – 2017-03-07 (×7): 3 mL via INTRAVENOUS

## 2017-03-04 MED ORDER — DILTIAZEM HCL ER COATED BEADS 120 MG PO CP24
120.0000 mg | ORAL_CAPSULE | Freq: Every day | ORAL | Status: DC
  Administered 2017-03-05 – 2017-03-08 (×4): 120 mg via ORAL
  Filled 2017-03-04 (×4): qty 1

## 2017-03-04 MED ORDER — FLUTICASONE FUROATE-VILANTEROL 100-25 MCG/INH IN AEPB
1.0000 | INHALATION_SPRAY | Freq: Every day | RESPIRATORY_TRACT | Status: DC
  Administered 2017-03-05 – 2017-03-08 (×4): 1 via RESPIRATORY_TRACT
  Filled 2017-03-04: qty 28

## 2017-03-04 MED ORDER — IRBESARTAN 150 MG PO TABS
150.0000 mg | ORAL_TABLET | Freq: Every day | ORAL | Status: DC
Start: 2017-03-05 — End: 2017-03-08
  Administered 2017-03-05 – 2017-03-08 (×4): 150 mg via ORAL
  Filled 2017-03-04 (×4): qty 1

## 2017-03-04 NOTE — Care Management Note (Addendum)
Case Management Note  Patient Details  Name: Lawrence Delgado MRN: [id number] Date of Birth: 11-02-1955  Subjective/Objective:    Persistent Afib, new start Tikosyn                Action/Plan: Discharge Planning:  Benefits Check- dofetilide (TIKOSYN) capsule 500 mcg -60 Co pay of 3.35 no Prior Auth need and patient can use pharmacy of his choice   NCM spoke to pt and made him aware that his pharmacy [facility] are not registered to dispense Tikosyn. [facility] [place]. [facility] and [facility] do [facility] in [facility]. Made pt aware.   Will need a Rx for 7 day supply for Tikosyn to [facility] prior to dc to take home. Left message for attending.   PCP [name], [name]  Expected Discharge Date:                 Expected Discharge Plan:  Home/Self Care  In-House Referral:  NA  Discharge planning Services  CM Consult, Medication Assistance  Post Acute Care Choice:  NA Choice offered to:  NA  DME Arranged:  N/A DME Agency:  NA  HH Arranged:  NA HH Agency:  NA  Status of Service:  Completed, signed off  If discussed at Long Length of Stay Meetings, dates discussed:    Additional Comments:  [name], [name], RN 03/04/2017, 4:00 PM

## 2017-03-04 NOTE — Progress Notes (Addendum)
Primary Care Physician: Samuel Jester, DO Referring Physician: Dr. Loraine Leriche is a 61 y.o. 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 Admitted with new onset atrial fibrillation 10/16. TSH normal. Echocardiogram October 2016 was technically difficult. With Definity ejection fraction was felt to be 20%. Transesophageal echocardiogram October 2016 showed ejection fraction 35-40% and mild mitral regurgitation. Nuclear study 11/16 showed EF 43, prior anterior apical infarct and no ischemia.  He was seen in the ER with rapid afib at 161 bpm and was successfully cardioverted. He was seen initially in afib clinic, 09/30/16, he is in SR, EKG shows sinus brady at 57 bpm. He is asking for medications that can help him stay in rhythm. He wears oxygen and was recently evaluated by a pulmonologist who felt his issues were most likely do to COPD, smoking for many years, but quitting in 2005 and heart failure. He denies using alcohol, mod caffeine, uses cpap. Morbidly obese and sedentary. Left atrium was normal in size in 2016.  Pt was offered Tikosyn at that appointment but he changed his mind saying that he is scared of the drug. He went on to have persisitent afib and recently had an unsuccessful cardioversion.  He is now back in the afib clinic, 7/27, to discuss hospitalization for tikosyn.He is still having RVR at 141 bpm, despite having BB increased to 50 mg tid. He is pending an echo this am but with RVR would not be a good time to get this. Will cancel and plan to get in hospital or when HR is better controlled/SR.Pt is now ready to plan for hospitalization next Tuesday to come in for tikosyn. No missed doses of apixaban. No use of benadryl.  F/u in afib clinic, 8/7.  He is here to pursue hospitalization for tikosyn.We tried to arrange this last week but he did not have a ride and the taxi we arranged for him did not show up. He has  checked on the cost and he can afford. I started Cardizem on last visit to try to slow him done, but he is still running around 130 bpm today. I do not anticipate this drug being continued if SR is obtained with Tikosyn. He also still needs an echo with prior TMC/LV dysfunction of 20%, when SR or slower v rate obtained.No missed dosed of eliquis.   Today, he denies symptoms of palpitations, chest pain,  orthopnea, PND, lower extremity edema, dizziness, presyncope, syncope, or neurologic sequela. Positive for chronic shortness of breath. The patient is tolerating medications without difficulties and is otherwise without complaint today.   Past Medical History:  Diagnosis Date  . CAD (coronary artery disease)   . DM (diabetes mellitus) (HCC)   . GERD (gastroesophageal reflux disease)   . HLD (hyperlipidemia)   . HTN (hypertension)   . MI (myocardial infarction) (HCC)   . Obesity   . Sleep apnea    Past Surgical History:  Procedure Laterality Date  . CARDIAC CATHETERIZATION     with coronary angiography   . CARDIOVERSION N/A 05/05/2015   Procedure: CARDIOVERSION;  Surgeon: Vesta Mixer, MD;  Location[facility] ENDOSCOPY;  Service: Cardiovascular;  Laterality: N/A;  . CARDIOVERSION N/A 02/14/2017   Procedure: CARDIOVERSION;  Surgeon: Vesta Mixer, MD;  Location[facility] ENDOSCOPY;  Service: Cardiovascular;  Laterality: N/A;  . CORONARY STENT PLACEMENT     eluting stent placement in the culpric lesion of  the LAD  . TEE WITHOUT CARDIOVERSION N/A 05/05/2015   Procedure: TRANSESOPHAGEAL ECHOCARDIOGRAM (TEE);  Surgeon: Vesta MixerPhilip J Nahser, MD;  [facility];  Service: Cardiovascular;  Laterality: N/A;    Current Outpatient Prescriptions  Medication Sig Dispense Refill  . apixaban (ELIQUIS) 5 MG TABS tablet Take 1 tablet (5 mg total) by mouth 2 (two) times daily. 180 tablet 3  . budesonide-formoterol (SYMBICORT) 80-4.5 MCG/ACT inhaler Inhale 2 puffs into the lungs 2 (two) times daily. 1 Inhaler  12  . diltiazem (CARDIZEM CD) 120 MG 24 hr capsule Take 1 capsule (120 mg total) by mouth daily. 30 capsule 6  . furosemide (LASIX) 40 MG tablet TAKE 1 TABLET DAILY 90 tablet 3  . insulin lispro (HUMALOG) 100 UNIT/ML injection Inject 7-17 Units into the skin 3 (three) times daily before meals. Sliding scale    . insulin lispro protamine-lispro (HUMALOG 75/25 MIX) (75-25) 100 UNIT/ML SUSP injection Inject 50 Units into the skin 2 (two) times daily with a meal.     . irbesartan (AVAPRO) 150 MG tablet TAKE 1 TABLET DAILY 90 tablet 3  . magnesium oxide (MAG-OX) 400 MG tablet Take 400 mg by mouth daily.    . metFORMIN (GLUCOPHAGE) 500 MG tablet Take 500 mg by mouth 2 (two) times daily with a meal.     . metoprolol succinate (TOPROL-XL) 50 MG 24 hr tablet Take 4 tablets (200 mg total) by mouth daily. Take with or immediately following a meal. 270 tablet 3  . nitroGLYCERIN (NITROSTAT) 0.4 MG SL tablet Place 1 tablet (0.4 mg total) under the tongue every 5 (five) minutes as needed. 25 tablet 5  . potassium chloride (K-DUR,KLOR-CON) 10 MEQ tablet 10 mEq. Take 2 tablets (20Meq) once daily    . rosuvastatin (CRESTOR) 40 MG tablet TAKE 1 TABLET DAILY 90 tablet 3  . spironolactone (ALDACTONE) 25 MG tablet 1 tablet daily.    . Vitamin D, Ergocalciferol, (DRISDOL) 50000 UNITS CAPS Take 50,000 Units by mouth 2 (two) times a week. Monday and Thursday    . diltiazem (CARDIZEM) 30 MG tablet Take 1 tablet every 4 hours AS NEEDED for afib fast heart rate greater than 100 (Patient not taking: Reported on 03/04/2017) 45 tablet 1   No current facility-administered medications for this encounter.     Allergies  Allergen Reactions  . Codeine     Liquid for caused REACTION: Nausea/vomiting  . Clopidogrel Bisulfate Itching and Rash    Social History   Social History  . Marital status: Married    Spouse name: N/A  . Number of children: N/A  . Years of education: N/A   Occupational History  . Not on file.    Social History Main Topics  . Smoking status: Former Smoker    Packs/day: 3.00    Years: 25.00  . Smokeless tobacco: Never Used     Comment: quit smoking in 2005  . Alcohol use No  . Drug use: No  . Sexual activity: Not on file   Other Topics Concern  . Not on file   Social History Narrative  . No narrative on file    Family History  Problem Relation Age of Onset  . Heart attack Mother   . Heart attack Father     ROS- All systems are reviewed and negative except as per the HPI above  Physical Exam: Vitals:   03/04/17 0937  BP: 104/76  Pulse: (!) 129  Weight: 280 lb 12.8 oz (127.4 kg)  Height: 6'  1" (1.854 m)   Wt Readings from Last 3 Encounters:  03/04/17 280 lb 12.8 oz (127.4 kg)  02/21/17 282 lb 3.2 oz (128 kg)  02/12/17 274 lb (124.3 kg)    Labs: Lab Results  Component Value Date   NA 132 (L) 03/04/2017   K 4.6 03/04/2017   CL 92 (L) 03/04/2017   CO2 29 03/04/2017   GLUCOSE 364 (H) 03/04/2017   BUN 19 03/04/2017   CREATININE 1.29 (H) 03/04/2017   CALCIUM 9.4 03/04/2017   MG 1.4 (L) 03/04/2017   No results found for: INR Lab Results  Component Value Date   CHOL 142 05/13/2013   HDL 39.20 05/13/2013   LDLCALC 63 05/13/2013   TRIG 197.0 (H) 05/13/2013     GEN- The patient is chronically ill appearing, alert and oriented x 3 today.  Wearing O2 by [place] Head- normocephalic, atraumatic Eyes-  Sclera clear, conjunctiva pink Ears- hearing intact Oropharynx- clear Neck- supple, no JVP Lymph- no cervical lymphadenopathy Lungs- Clear to ausculation bilaterally, normal work of breathing Heart- Rapid irregular rate and rhythm, no murmurs, rubs or gallops, PMI not laterally displaced GI- soft, NT, ND, + BS. protuberant abdomen Extremities- no clubbing, cyanosis, trace edema MS- no significant deformity or atrophy Skin- no rash or lesion Psych- euthymic mood, full affect Neuro- strength and sensation are intact  EKG-Afib at 129 bpm,  qrs int 76 ms,  qtc 451 ms Epic records reviewed    Assessment and Plan:  1. Persistent  afib  Pt previously had been set up for hospitalization several months ago for Tikosyn but backed out He has now failed cardioversion and has RVR  difficult to treat He is now ready to commit for hospitalization for tikosyn Metoprolol xl just increased to 50 mg tid without much effect to slow pt Cardizem 120 mg daily short term until hospitalization for tikosyn, has not been that helpful with rate control and if SR is obtained, may be able to be d/c Qtc less than 440 ms, when hi is in SR, in rapid afib qtc is 451 today  He can afford drug Drugs  reviewed by PharmD, not on any qtc prolonging drugs, has not taken benadryl and no supplements other than what is known He will continue apixaban which he states he has not missed any doses Needs updated echo but RVR has prevented pt getting recently,possibly echo can be obtained in hospital if SR can be obtained, or significant slowing of v rate   Addendum Labs in, K+ ok at 4.6, mag low at 1.4 despite taking po supplement. Bed will not be ready for several hours. Spoke to [employer], NP and will start Mag 2 grams IV over the next 90 mins to hopefully get mag up to 2.0 to start drug. They have to rely on transportation to get them here and live about an hour away, so will not send home with increased po supplementation. Glucose also elevated at 364, crcl cal at 110.79. Being admitted to 3W22 when mag infusion completed   Elvina Sidle. Matthew Folks Afib Clinic [facility] [address] [place], [place] [address] [phone]   I have seen, examined the patient, and reviewed the above assessment and plan.  Changes to above are made where necessary.  On exam, iRRR.  Overweight. Pt is admitted for initiation of tikosyn.  Mg repletion performed.  If he does not convert to sinus then he will require cardioversion later during this admission.  Co Sign: Fayrene Fearing  Manisha Cancel,  MD 03/04/2017 3:57 PM

## 2017-03-04 NOTE — H&P (Signed)
Primary Care Physician: [name], Cynthia, DO Referring Physician: Dr. Loraine Lericherenshaw   Lawrence Delgado is a 61 y.o. 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 Admitted with new onset atrial fibrillation 10/16. TSH normal. Echocardiogram October 2016 was technically difficult. With Definity ejection fraction was felt to be 20%. Transesophageal echocardiogram October 2016 showed ejection fraction 35-40% and mild mitral regurgitation. Nuclear study 11/16 showed EF 43, prior anterior apical infarct and no ischemia.  He was seen in the ER with rapid afib at 161 bpm and was successfully cardioverted. He was seen initially in afib clinic, 09/30/16, he is in SR, EKG shows sinus brady at 57 bpm. He is asking for medications that can help him stay in rhythm. He wears oxygen and was recently evaluated by a pulmonologist who felt his issues were most likely do to COPD, smoking for many years, but quitting in 2005 and heart failure. He denies using alcohol, mod caffeine, uses cpap. Morbidly obese and sedentary. Left atrium was normal in size in 2016.  Pt was offered Tikosyn at that appointment but he changed his mind saying that he is scared of the drug. He went on to have persisitent afib and recently had an unsuccessful cardioversion.  He is now back in the afib clinic, 7/27, to discuss hospitalization for tikosyn.He is still having RVR at 141 bpm, despite having BB increased to 50 mg tid. He is pending an echo this am but with RVR would not be a good time to get this. Will cancel and plan to get in hospital or when HR is better controlled/SR.Pt is now ready to plan for hospitalization next Tuesday to come in for tikosyn. No missed doses of apixaban. No use of benadryl.  F/u in afib clinic, 8/7.  He is here to pursue hospitalization for tikosyn.We tried to arrange this last week but he did not have a ride and the taxi we  arranged for him did not show up. He has checked on the cost and he can afford. I started Cardizem on last visit to try to slow him done, but he is still running around 130 bpm today. I do not anticipate this drug being continued if SR is obtained with Tikosyn. He also still needs an echo with prior TMC/LV dysfunction of 20%, when SR or slower v rate obtained.No missed dosed of eliquis.   Today, he denies symptoms of palpitations, chest pain,  orthopnea, PND, lower extremity edema, dizziness, presyncope, syncope, or neurologic sequela. Positive for chronic shortness of breath. The patient is tolerating medications without difficulties and is otherwise without complaint today.   Past Medical History:  Diagnosis Date  . Arthritis    " In my knees   . Atrial fibrillation (HCC) 02/2017  . CAD (coronary artery disease)   . CHF (congestive heart failure) (HCC)   . DM (diabetes mellitus) (HCC)    insulin dependent  . GERD (gastroesophageal reflux disease)   . HLD (hyperlipidemia)   . HTN (hypertension)   . MI (myocardial infarction) (HCC)   . Obesity   . Sleep apnea    Past Surgical History:  Procedure Laterality Date  . CARDIAC CATHETERIZATION     with coronary angiography   . CARDIOVERSION N/A 05/05/2015   Procedure: CARDIOVERSION;  Surgeon: Vesta MixerPhilip J Nahser, MD;  [facility] END[facility];  Service: Cardiovascular;  Laterality: N/A;  . CARDIOVERSION N/A 02/14/2017  Procedure: CARDIOVERSION;  Surgeon: Elease Hashimoto Deloris Ping, MD;  Location: [facility] ENDOSCOPY;  Service: Cardiovascular;  Laterality: N/A;  . CORONARY STENT PLACEMENT     eluting stent placement in the culpric lesion of the LAD  . TEE WITHOUT CARDIOVERSION N/A 05/05/2015   Procedure: TRANSESOPHAGEAL ECHOCARDIOGRAM (TEE);  Surgeon: Vesta Mixer, MD;  Location: [facility] ENDOSCOPY;  Service: Cardiovascular;  Laterality: N/A;    Current Facility-Administered Medications  Medication Dose Route Frequency Provider Last Rate Last Dose  . 0.9 %  sodium  chloride infusion  250 mL Intravenous PRN Gypsy Balsam K, NP      . apixaban (ELIQUIS) tablet 5 mg  5 mg Oral BID Marily Lente, NP      . Melene Muller ON 03/05/2017] diltiazem (CARDIZEM CD) 24 hr capsule 120 mg  120 mg Oral Daily Seiler, [facility] K, NP      . dofetilide (TIKOSYN) capsule 500 mcg  500 mcg Oral BID Glory Buff, [facility] K, NP      . fluticasone furoate-vilanterol (BREO ELLIPTA) 100-25 MCG/INH 1 puff  1 puff Inhalation Daily Marily Lente, NP      . Melene Muller ON 03/05/2017] furosemide (LASIX) tablet 40 mg  40 mg Oral Daily Seiler, [facility] K, NP      . insulin aspart (novoLOG) injection 0-15 Units  0-15 Units Subcutaneous TID WC Gypsy Balsam K, NP      . Melene Muller ON 03/05/2017] irbesartan (AVAPRO) tablet 150 mg  150 mg Oral Daily Seiler, Amber K, NP      . magnesium oxide (MAG-OX) tablet 400 mg  400 mg Oral BID Gypsy Balsam K, NP   400 mg at 03/04/17 1417  . metFORMIN (GLUCOPHAGE) tablet 500 mg  500 mg Oral BID WC Gypsy Balsam K, NP      . Melene Muller ON 03/05/2017] metoprolol succinate (TOPROL-XL) 24 hr tablet 200 mg  200 mg Oral Daily Seiler, Amber K, NP      . potassium chloride SA (K-DUR,KLOR-CON) CR tablet 20 mEq  20 mEq Oral Daily Gypsy Balsam K, NP   20 mEq at 03/04/17 1417  . rosuvastatin (CRESTOR) tablet 40 mg  40 mg Oral q1800 Gypsy Balsam K, NP      . sodium chloride flush (NS) 0.9 % injection 3 mL  3 mL Intravenous Q12H Gypsy Balsam K, NP   3 mL at 03/04/17 1426  . sodium chloride flush (NS) 0.9 % injection 3 mL  3 mL Intravenous PRN Marily Lente, NP      . Melene Muller ON 03/05/2017] spironolactone (ALDACTONE) tablet 25 mg  25 mg Oral Daily Marily Lente, NP        Allergies  Allergen Reactions  . Codeine     Liquid for caused REACTION: Nausea/vomiting  . Clopidogrel Bisulfate Itching and Rash    Social History   Social History  . Marital status: Married    Spouse name: N/A  . Number of children: N/A  . Years of education: N/A   Occupational History  . Not on file.   Social History  Main Topics  . Smoking status: Former Smoker    Packs/day: 3.00    Years: 25.00  . Smokeless tobacco: Never Used     Comment: quit smoking in 2005  . Alcohol use No  . Drug use: No  . Sexual activity: Not on file   Other Topics Concern  . Not on file   Social History Narrative  . No narrative on file    Family History  Problem Relation Age of Onset  . Heart attack Mother   . Heart attack Father     ROS- All systems are reviewed and negative except as per the HPI above  Physical Exam: Vitals:   03/04/17 1400  Pulse: (!) 134  Resp: 19  SpO2: 98%  Weight: 280 lb (127 kg)  Height: 6\' 1"  (1.854 m)   Wt Readings from Last 3 Encounters:  03/04/17 280 lb (127 kg)  03/04/17 280 lb 12.8 oz (127.4 kg)  02/21/17 282 lb 3.2 oz (128 kg)    Labs: Lab Results  Component Value Date   NA 132 (L) 03/04/2017   K 4.6 03/04/2017   CL 92 (L) 03/04/2017   CO2 29 03/04/2017   GLUCOSE 364 (H) 03/04/2017   BUN 19 03/04/2017   CREATININE 1.29 (H) 03/04/2017   CALCIUM 9.4 03/04/2017   MG 1.4 (L) 03/04/2017   No results found for: INR Lab Results  Component Value Date   CHOL 142 05/13/2013   HDL 39.20 05/13/2013   LDLCALC 63 05/13/2013   TRIG 197.0 (H) 05/13/2013     GEN- The patient is chronically ill appearing, alert and oriented x 3 today.  Wearing O2 by [place]- normocephalic, atraumatic Eyes-  Sclera clear, conjunctiva pink Ears- hearing intact Oropharynx- clear Neck- supple, no JVP Lymph- no cervical lymphadenopathy Lungs- Clear to ausculation bilaterally, normal work of breathing Heart- Rapid irregular rate and rhythm, no murmurs, rubs or gallops, PMI not laterally displaced GI- soft, NT, ND, + BS. protuberant abdomen Extremities- no clubbing, cyanosis, trace edema MS- no significant deformity or atrophy Skin- no rash or lesion Psych- euthymic mood, full affect Neuro- strength and sensation are intact  EKG-Afib at 129 bpm,  qrs int 76 ms, qtc 451 ms Epic  records reviewed    Assessment and Plan:  1. Persistent  afib  Pt previously had been set up for hospitalization several months ago for Tikosyn but backed out He has now failed cardioversion and has RVR  difficult to treat He is now ready to commit for hospitalization for tikosyn Metoprolol xl just increased to 50 mg tid without much effect to slow pt Cardizem 120 mg daily short term until hospitalization for tikosyn, has not been that helpful with rate control and if SR is obtained, may be able to be d/c Qtc less than 440 ms, when hi is in SR, in rapid afib qtc is 451 today  He can afford drug Drugs  reviewed by PharmD, not on any qtc prolonging drugs, has not taken benadryl and no supplements other than what is known He will continue apixaban which he states he has not missed any doses Needs updated echo but RVR has prevented pt getting recently,possibly echo can be obtained in hospital if SR can be obtained, or significant slowing of v rate   Addendum Labs in, K+ ok at 4.6, mag low at 1.4 despite taking po supplement. Bed will not be ready for several hours. Spoke to [employer], NP and will start Mag 2 grams IV over the next 90 mins to hopefully get mag up to 2.0 to start drug. They have to rely on transportation to get them here and live about an hour away, so will not send home with increased po supplementation. Glucose also elevated at 364, crcl cal at 110.79. Being admitted to 3W22 when mag infusion completed   Elvina Sidle. [facility] [address] [place], [place] [address] [phone]  I have seen, examined the patient, and reviewed the above assessment and plan.  Changes to above are made where necessary.  On exam, iRRR.  Overweight. Pt is admitted for initiation of tikosyn.  Mg repletion performed.  If he does not convert to sinus then he will require cardioversion later during this admission.  Co Sign: Hillis Range,  MD 03/04/2017 3:59 PM

## 2017-03-04 NOTE — Addendum Note (Signed)
Encounter addended by: Hillis RangeAllred, Mishka Stegemann, MD on: 03/04/2017  3:58 PM<BR>    Actions taken: Sign clinical note, LOS modified

## 2017-03-04 NOTE — Progress Notes (Addendum)
Inpatient Diabetes Program Recommendations  AACE/ADA: New Consensus Statement on Inpatient Glycemic Control (2015)  Target Ranges:  Prepandial:   less than 140 mg/dL      Peak postprandial:   less than 180 mg/dL (1-2 hours)      Critically ill patients:  140 - 180 mg/dL   Lab Results  Component Value Date   GLUCAP 375 (H) 09/18/2016   HGBA1C 10.2 (H) 05/02/2015    Review of Glycemic Control  Results for Marchelle GearingVERNON, Jveon R (MRN [id number]) as of 03/04/2017 14:03  Ref. Range 03/04/2017 09:35  Glucose Latest Ref Range: 65 - 99 mg/dL 409364 (H)   * diabetes referral noted  Diabetes history: Type 2 Outpatient Diabetes medications: Humalog 7-17 units tid, 75/25 50 units bid, Metformin 500 mg bid  Current orders for Inpatient glycemic control: Metformin 500 mg bid, Novolog 0-15 units tid  Inpatient Diabetes Program Recommendations:  Consider adding Lantus 38 units qhs starting now (half of home basal dose ) .   Consider adding Novolog 5 units tid with meals (hold if patient eats less than 50%).   Consider adding Novolog 0-5 units qhs.   Consider increasing Novolog correction to resistant correction 0-20 units tid (from Novolog 0-15 units).   Susette RacerJulie Heidi Maclin, RN, BA, MHA, CDE Diabetes Coordinator Inpatient Diabetes Program  [phone] (Team Pager) [phone] [facility] Office) 03/04/2017 2:22 PM

## 2017-03-05 ENCOUNTER — Other Ambulatory Visit: Payer: Self-pay

## 2017-03-05 LAB — HEMOGLOBIN A1C
Hgb A1c MFr Bld: 10.7 % — ABNORMAL HIGH (ref 4.8–5.6)
Mean Plasma Glucose: 260 mg/dL

## 2017-03-05 LAB — MAGNESIUM: Magnesium: 2.1 mg/dL (ref 1.7–2.4)

## 2017-03-05 LAB — GLUCOSE, CAPILLARY
GLUCOSE-CAPILLARY: 277 mg/dL — AB (ref 65–99)
GLUCOSE-CAPILLARY: 366 mg/dL — AB (ref 65–99)
GLUCOSE-CAPILLARY: 261 mg/dL — AB (ref 65–99)
Glucose-Capillary: 294 mg/dL — ABNORMAL HIGH (ref 65–99)

## 2017-03-05 LAB — BASIC METABOLIC PANEL
ANION GAP: 8 (ref 5–15)
BUN: 17 mg/dL (ref 6–20)
CHLORIDE: 96 mmol/L — AB (ref 101–111)
CO2: 31 mmol/L (ref 22–32)
Calcium: 9.3 mg/dL (ref 8.9–10.3)
Creatinine, Ser: 1.2 mg/dL (ref 0.61–1.24)
GFR calc Af Amer: >60 mL/min (ref >60)
GFR calc non Af Amer: >60 mL/min (ref >60)
GLUCOSE: 288 mg/dL — AB (ref 65–99)
POTASSIUM: 5 mmol/L (ref 3.5–5.1)
Sodium: 135 mmol/L (ref 135–145)

## 2017-03-05 LAB — HIV ANTIBODY (ROUTINE TESTING W REFLEX): HIV SCREEN 4TH GENERATION: NONREACTIVE

## 2017-03-05 MED ORDER — INSULIN GLARGINE 100 UNIT/ML ~~LOC~~ SOLN
35.0000 [IU] | Freq: Every day | SUBCUTANEOUS | Status: DC
  Administered 2017-03-05 – 2017-03-08 (×4): 35 [IU] via SUBCUTANEOUS
  Filled 2017-03-05 (×4): qty 0.35

## 2017-03-05 MED ORDER — INSULIN ASPART 100 UNIT/ML ~~LOC~~ SOLN
0.0000 [IU] | Freq: Three times a day (TID) | SUBCUTANEOUS | Status: DC
  Administered 2017-03-05: 11 [IU] via SUBCUTANEOUS
  Administered 2017-03-05: 20 [IU] via SUBCUTANEOUS
  Administered 2017-03-06: 15 [IU] via SUBCUTANEOUS
  Administered 2017-03-06: 7 [IU] via SUBCUTANEOUS
  Administered 2017-03-06 – 2017-03-07 (×4): 11 [IU] via SUBCUTANEOUS
  Administered 2017-03-08: 7 [IU] via SUBCUTANEOUS

## 2017-03-05 MED ORDER — INSULIN ASPART 100 UNIT/ML ~~LOC~~ SOLN
0.0000 [IU] | Freq: Every day | SUBCUTANEOUS | Status: DC
  Administered 2017-03-05 – 2017-03-07 (×3): 3 [IU] via SUBCUTANEOUS

## 2017-03-05 NOTE — Progress Notes (Signed)
Progress Note  Patient Name: Lawrence Delgado Date of Encounter: 03/05/2017  Primary Cardiologist: Dr. Jens Somrenshaw  Subjective   Tolerating medicine well, no complaints this AM  Inpatient Medications    Scheduled Meds: . apixaban  5 mg Oral BID  . diltiazem  120 mg Oral Daily  . dofetilide  500 mcg Oral BID  . fluticasone furoate-vilanterol  1 puff Inhalation Daily  . furosemide  40 mg Oral Daily  . insulin aspart  0-15 Units Subcutaneous TID WC  . irbesartan  150 mg Oral Daily  . magnesium oxide  400 mg Oral BID  . metFORMIN  500 mg Oral BID WC  . metoprolol succinate  200 mg Oral Daily  . potassium chloride  20 mEq Oral Daily  . rosuvastatin  40 mg Oral q1800  . sodium chloride flush  3 mL Intravenous Q12H  . spironolactone  25 mg Oral Daily   Continuous Infusions: . sodium chloride     PRN Meds: sodium chloride, sodium chloride flush   Vital Signs    Vitals:   03/04/17 1708 03/04/17 1930 03/05/17 0530 03/05/17 0831  BP:  112/86 123/83   Pulse:  91 72   Resp:  18 15   Temp: 97.8 F (36.6 C) 98 F (36.7 C) 98.2 F (36.8 C)   TempSrc:  Oral Oral   SpO2:  99% 99% 99%  Weight:   274 lb 9.6 oz (124.6 kg)   Height:        Intake/Output Summary (Last 24 hours) at 03/05/17 [id number] Last data filed at 03/05/17 0030  Gross per 24 hour  Intake             1080 ml  Output             2675 ml  Net            -1595 ml   Filed Weights   03/04/17 1400 03/05/17 0530  Weight: 280 lb (127 kg) 274 lb 9.6 oz (124.6 kg)    Telemetry    SR 50's-60's overnight, 7-0's this AM - Personally Reviewed  ECG    QT measured by myself 420-4540ms, correct 476-47598ms, reviewed with Dr. Johney FrameAllred - Personally Reviewed  Physical Exam   GEN: No acute distress.   Neck: No JVD Cardiac: RRR, no murmurs, rubs, or gallops.  Respiratory: Clear to auscultation bilaterally. GI: Soft, nontender, non-distended, obese MS: No edema; No deformity. Neuro:  Nonfocal  Psych: Normal affect   Labs      Chemistry Recent Labs Lab 03/04/17 0935 03/05/17 0223  NA 132* 135  K 4.6 5.0  CL 92* 96*  CO2 29 31  GLUCOSE 364* 288*  BUN 19 17  CREATININE 1.29* 1.20  CALCIUM 9.4 9.3  GFRNONAA 59* >60  GFRAA >60 >60  ANIONGAP 11 8      Radiology    No results found.  Cardiac Studies   No new studies  Patient Profile     61 y.o. male with PMHx of CAD, DM, morbid obesity, HTN, persistent AFib, here for tikosyn initiation  Assessment & Plan    1.  Persistent Afib      SR after 1st dose of medicine      Electrolytes, renal function and QTc stable to continue  2. HTN     No changes today  3. DM     Will ask DM RN to visit for recommendations     Continue sliding scale, on home insulin  Signed, Sheilah Pigeon, PA-C  03/05/2017, 9:23 AM    I have seen, examined the patient, and reviewed the above assessment and plan. On exam, RRR.  Now in sinus.  QT is stable.  Changes to above are made where necessary.    Co Sign: Hillis Range, MD 03/05/2017 12:34 PM

## 2017-03-06 ENCOUNTER — Encounter (HOSPITAL_COMMUNITY)
Admission: AD | Disposition: A | Discharge status: Home / Self Care | Payer: Self-pay | Source: Ambulatory Visit | Attending: Internal Medicine

## 2017-03-06 ENCOUNTER — Other Ambulatory Visit: Payer: Self-pay

## 2017-03-06 LAB — BASIC METABOLIC PANEL
ANION GAP: 7 (ref 5–15)
BUN: 19 mg/dL (ref 6–20)
CO2: 31 mmol/L (ref 22–32)
Calcium: 9 mg/dL (ref 8.9–10.3)
Chloride: 96 mmol/L — ABNORMAL LOW (ref 101–111)
Creatinine, Ser: 1.13 mg/dL (ref 0.61–1.24)
GFR calc Af Amer: >60 mL/min (ref >60)
GLUCOSE: 191 mg/dL — AB (ref 65–99)
POTASSIUM: 4.5 mmol/L (ref 3.5–5.1)
Sodium: 134 mmol/L — ABNORMAL LOW (ref 135–145)

## 2017-03-06 LAB — GLUCOSE, CAPILLARY
GLUCOSE-CAPILLARY: 315 mg/dL — AB (ref 65–99)
GLUCOSE-CAPILLARY: 292 mg/dL — AB (ref 65–99)
GLUCOSE-CAPILLARY: 299 mg/dL — AB (ref 65–99)
Glucose-Capillary: 227 mg/dL — ABNORMAL HIGH (ref 65–99)

## 2017-03-06 LAB — MAGNESIUM: MAGNESIUM: 1.8 mg/dL (ref 1.7–2.4)

## 2017-03-06 SURGERY — CARDIOVERSION
Anesthesia: Monitor Anesthesia Care

## 2017-03-06 MED ORDER — DOFETILIDE 250 MCG PO CAPS
250.0000 ug | ORAL_CAPSULE | Freq: Two times a day (BID) | ORAL | Status: DC
  Administered 2017-03-06: 250 ug via ORAL
  Filled 2017-03-06: qty 1

## 2017-03-06 MED ORDER — MAGNESIUM SULFATE IN D5W 1-5 GM/100ML-% IV SOLN
1.0000 g | Freq: Once | INTRAVENOUS | Status: AC
  Administered 2017-03-06: 1 g via INTRAVENOUS
  Filled 2017-03-06: qty 100

## 2017-03-06 MED ORDER — LIVING WELL WITH DIABETES BOOK
Freq: Once | Status: AC
  Administered 2017-03-06: 18:00:00
  Filled 2017-03-06: qty 1

## 2017-03-06 NOTE — Plan of Care (Signed)
Problem: Education: Goal: Understanding of medication regimen will improve Outcome: Progressing Pt has converted to SR with Tikosyn. Pt has received 3rd dose without complication.

## 2017-03-06 NOTE — Progress Notes (Signed)
Called on patient for a 1200 cardioversion. Floor Rn states patient is in NSR. There is a 12 lead in the record. I will notify Dr. Mayford Knifeurner and take patient off the schedule for now.

## 2017-03-06 NOTE — Progress Notes (Signed)
Spoke with patient about his diabetes.  Was diagnosed with diabetes in 2005. Had started on oral agents at that time, but then added insulin later.  Is now taking Humalog 75/25 mixed insulin 50 units BID, Humalog sliding scale TID, and Metformin at home. Blood sugars have improved and are usually in the 200's.  Checks blood sugars 4-5 times per day. States that he really would like to be able to exercise by walking, if he can quit using the oxygen.  He used to walk up to 5 miles three times per week. Discussed HgbA1C of 10.7%. He was not aware of past A1C results. Interested in foods to eat. States that he only eats one meal per day. Will order Living Well with Diabetes booklet for him.  Was given some information on diet and general DM care.   Smith MinceKendra Rita Prom RN BSN CDE Diabetes Coordinator Pager: [phone]  8am-5pm

## 2017-03-06 NOTE — Progress Notes (Addendum)
Progress Note  Patient Name: Lawrence GearingJoseph R Haberle Date of Encounter: 03/06/2017  Primary Cardiologist: Dr. Jens Somrenshaw  Subjective   Feels well, no CP, palpitations or SOB  Inpatient Medications    Scheduled Meds: . apixaban  5 mg Oral BID  . diltiazem  120 mg Oral Daily  . fluticasone furoate-vilanterol  1 puff Inhalation Daily  . furosemide  40 mg Oral Daily  . insulin aspart  0-20 Units Subcutaneous TID WC  . insulin aspart  0-5 Units Subcutaneous QHS  . insulin glargine  35 Units Subcutaneous Daily  . irbesartan  150 mg Oral Daily  . magnesium oxide  400 mg Oral BID  . metFORMIN  500 mg Oral BID WC  . metoprolol succinate  200 mg Oral Daily  . potassium chloride  20 mEq Oral Daily  . rosuvastatin  40 mg Oral q1800  . sodium chloride flush  3 mL Intravenous Q12H  . spironolactone  25 mg Oral Daily   Continuous Infusions: . sodium chloride     PRN Meds: sodium chloride, sodium chloride flush   Vital Signs    Vitals:   03/05/17 0831 03/05/17 2018 03/06/17 0534 03/06/17 0940  BP:  114/82 98/64   Pulse:  65 65 64  Resp:  18 18 16   Temp:  97.7 F (36.5 C) 97.9 F (36.6 C)   TempSrc:  Oral Oral   SpO2: 99% 98% 98% 99%  Weight:   274 lb 3.2 oz (124.4 kg)   Height:        Intake/Output Summary (Last 24 hours) at 03/06/17 1056 Last data filed at 03/05/17 2000  Gross per 24 hour  Intake              240 ml  Output                0 ml  Net              240 ml   Filed Weights   03/04/17 1400 03/05/17 0530 03/06/17 0534  Weight: 280 lb (127 kg) 274 lb 9.6 oz (124.6 kg) 274 lb 3.2 oz (124.4 kg)    Telemetry    SR generally 60's70's  - Personally Reviewed  ECG    SR, QT with marked prolongation, reviewed with Dr. Johney FrameAllred - Personally Reviewed  Physical Exam   GEN: No acute distress.   Neck: No JVD Cardiac: RRR, no murmurs, rubs, or gallops.  Respiratory: Clear to auscultation bilaterally. GI: Soft, nontender, non-distended, obese MS: No edema; No  deformity. Neuro:  Nonfocal  Psych: Normal affect   Labs    Chemistry  Recent Labs Lab 03/04/17 0935 03/05/17 0223 03/06/17 0346  NA 132* 135 134*  K 4.6 5.0 4.5  CL 92* 96* 96*  CO2 29 31 31   GLUCOSE 364* 288* 191*  BUN 19 17 19   CREATININE 1.29* 1.20 1.13  CALCIUM 9.4 9.3 9.0  GFRNONAA 59* >60 >60  GFRAA >60 >60 >60  ANIONGAP 11 8 7       Radiology    No results found.  Cardiac Studies   No new studies  Patient Profile     61 y.o. male with PMHx of CAD, DM, morbid obesity, HTN, persistent AFib, here for tikosyn initiation  Assessment & Plan    1.  Persistent Afib      SR after 1st dose of medicine      K+ 4.5      Mag 1.8 replaced  Creat 1.13 stable      QT has prolonged  Holding Tikosyn, will check EKG this afternoon for POC going forward, patient aware CHA2DS2Vasc is at least 3, continue Eliquis  2. HTN     No changes today  3. DM     Appreciate DM coordinator help, BS remain elevated     Continue coverage  Signed, Sheilah Pigeon, PA-C  03/06/2017, 10:56 AM    I have seen, examined the patient, and reviewed the above assessment and plan.  On exam, RRR.  Changes to above are made where necessary.  Given qt prolongation, will hold tikosyn this am and reassess QT this afternoon.  I would favor reduction of dose to 250 mcg BID if qt will allow.  Will assess EF now that he is in sinus rhythm.  Echo is ordered.  Co Sign: Hillis Range, MD 03/06/2017 1:57 PM    3pm EKG reviewed personally by me and reveals that QT is improved.  No arrhythmias today on telemetry. Will restart tikosyn tonight 250 mcg BID and follow closely in the hospital.  I do not anticipate that he will be ready for discharge tomorrow, though he is anxious to leave.  Hillis Range MD, [facility] 03/06/2017 7:59 PM

## 2017-03-06 NOTE — Progress Notes (Signed)
Inpatient Diabetes Program Recommendations  AACE/ADA: New Consensus Statement on Inpatient Glycemic Control (2015)  Target Ranges:  Prepandial:   less than 140 mg/dL      Peak postprandial:   less than 180 mg/dL (1-2 hours)      Critically ill patients:  140 - 180 mg/dL   Lab Results  Component Value Date   GLUCAP 227 (H) 03/06/2017   HGBA1C 10.7 (H) 03/04/2017    Review of Glycemic Control  Results for Lawrence GearingVERNON, Lawrence R (MRN [id number]) as of 03/06/2017 09:43  Ref. Range 03/05/2017 11:55 03/05/2017 16:18 03/05/2017 22:38 03/06/2017 07:29  Glucose-Capillary Latest Ref Range: 65 - 99 mg/dL 621277 (H) 308366 (H) 657261 (H) 227 (H)    Diabetes history: Type 2 Outpatient Diabetes medications: Humalog 7-17 units tid, 75/25 50 units bid, Metformin 500 mg bid  Current orders for Inpatient glycemic control: Metformin 500 mg bid, Novolog 0-20 units tid, Novolog 0-5 units qhs, Lantus 35 units q day  Inpatient Diabetes Program Recommendations:  Consider increasing Lantus to 44 units qhs starting now (0.35 units/kg ).   Susette RacerJulie Gloristine Turrubiates, RN, BA, MHA, CDE Diabetes Coordinator Inpatient Diabetes Program  [phone] (Team Pager) [phone] [facility] Office) 03/06/2017 9:58 AM

## 2017-03-07 ENCOUNTER — Other Ambulatory Visit: Payer: Self-pay

## 2017-03-07 ENCOUNTER — Inpatient Hospital Stay (HOSPITAL_COMMUNITY): Payer: Medicare Other

## 2017-03-07 DIAGNOSIS — I361 Nonrheumatic tricuspid (valve) insufficiency: Secondary | ICD-10-CM

## 2017-03-07 LAB — ECHOCARDIOGRAM COMPLETE
Height: 73 in
Weight: 4364.8 oz

## 2017-03-07 LAB — GLUCOSE, CAPILLARY
GLUCOSE-CAPILLARY: 290 mg/dL — AB (ref 65–99)
GLUCOSE-CAPILLARY: 259 mg/dL — AB (ref 65–99)
Glucose-Capillary: 286 mg/dL — ABNORMAL HIGH (ref 65–99)
Glucose-Capillary: 253 mg/dL — ABNORMAL HIGH (ref 65–99)

## 2017-03-07 LAB — BASIC METABOLIC PANEL
ANION GAP: 8 (ref 5–15)
BUN: 22 mg/dL — ABNORMAL HIGH (ref 6–20)
CALCIUM: 9.1 mg/dL (ref 8.9–10.3)
CHLORIDE: 97 mmol/L — AB (ref 101–111)
CO2: 29 mmol/L (ref 22–32)
Creatinine, Ser: 1.37 mg/dL — ABNORMAL HIGH (ref 0.61–1.24)
GFR calc non Af Amer: 55 mL/min — ABNORMAL LOW (ref >60)
Glucose, Bld: 200 mg/dL — ABNORMAL HIGH (ref 65–99)
POTASSIUM: 4.6 mmol/L (ref 3.5–5.1)
Sodium: 134 mmol/L — ABNORMAL LOW (ref 135–145)

## 2017-03-07 LAB — MAGNESIUM: Magnesium: 1.9 mg/dL (ref 1.7–2.4)

## 2017-03-07 MED ORDER — DOFETILIDE 125 MCG PO CAPS
125.0000 ug | ORAL_CAPSULE | Freq: Two times a day (BID) | ORAL | Status: DC
  Administered 2017-03-07 (×2): 125 ug via ORAL
  Filled 2017-03-07 (×2): qty 1

## 2017-03-07 MED ORDER — PERFLUTREN LIPID MICROSPHERE
INTRAVENOUS | Status: AC
  Filled 2017-03-07: qty 10

## 2017-03-07 MED ORDER — PERFLUTREN LIPID MICROSPHERE
1.0000 mL | INTRAVENOUS | Status: AC | PRN
  Administered 2017-03-07: 2 mL via INTRAVENOUS
  Filled 2017-03-07: qty 10

## 2017-03-07 NOTE — Progress Notes (Signed)
  Echocardiogram 2D Echocardiogram with Definity has been performed.  Nolon RodBrown, Tony 03/07/2017, 3:42 PM

## 2017-03-07 NOTE — Progress Notes (Signed)
Results for Marchelle GearingVERNON, Kaydyn R (MRN [id number]) as of 03/07/2017 11:31  Ref. Range 03/06/2017 07:29 03/06/2017 12:14 03/06/2017 17:28 03/06/2017 21:34 03/07/2017 07:34  Glucose-Capillary Latest Ref Range: 65 - 99 mg/dL 409227 (H) 811315 (H) 914292 (H) 299 (H) 286 (H)  Noted that blood sugar continue to be greater than 200 mg/dl. Recommend increasing Lantus to 45 units daily (apporximately 3.5 units/kg) if blood sugars continue to be elevated.   Smith MinceKendra Amirr Achord RN BSN CDE Diabetes Coordinator Pager: [phone]  8am-5pm

## 2017-03-07 NOTE — Care Management Important Message (Signed)
Important Message  Patient Details  Name: Lawrence GearingJoseph R Delgado MRN: [id number] Date of Birth: 1955-09-29   Medicare Important Message Given:  Yes    Gracin Soohoo Abena 03/07/2017, 9:30 AM

## 2017-03-07 NOTE — Progress Notes (Signed)
Progress Note  Patient Name: Lawrence Delgado Date of Encounter: 03/07/2017  Primary Cardiologist: Dr. Jens Som  Subjective   Feels well, no CP, palpitations or SOB  Inpatient Medications    Scheduled Meds: . apixaban  5 mg Oral BID  . diltiazem  120 mg Oral Daily  . dofetilide  250 mcg Oral BID  . fluticasone furoate-vilanterol  1 puff Inhalation Daily  . furosemide  40 mg Oral Daily  . insulin aspart  0-20 Units Subcutaneous TID WC  . insulin aspart  0-5 Units Subcutaneous QHS  . insulin glargine  35 Units Subcutaneous Daily  . irbesartan  150 mg Oral Daily  . magnesium oxide  400 mg Oral BID  . metFORMIN  500 mg Oral BID WC  . metoprolol succinate  200 mg Oral Daily  . potassium chloride  20 mEq Oral Daily  . rosuvastatin  40 mg Oral q1800  . sodium chloride flush  3 mL Intravenous Q12H  . spironolactone  25 mg Oral Daily   Continuous Infusions: . sodium chloride     PRN Meds: sodium chloride, sodium chloride flush   Vital Signs    Vitals:   03/06/17 0940 03/06/17 1457 03/06/17 2013 03/07/17 0512  BP:  99/69 112/68 99/64  Pulse: 64 (!) 57 62 68  Resp: 16 16 18 20   Temp:  97.8 F (36.6 C) (!) 97.5 F (36.4 C) 97.6 F (36.4 C)  TempSrc:  Oral Oral Oral  SpO2: 99% 99% 100% 100%  Weight:    272 lb 12.8 oz (123.7 kg)  Height:        Intake/Output Summary (Last 24 hours) at 03/07/17 0733 Last data filed at 03/07/17 0600  Gross per 24 hour  Intake              120 ml  Output              475 ml  Net             -355 ml   Filed Weights   03/05/17 0530 03/06/17 0534 03/07/17 0512  Weight: 274 lb 9.6 oz (124.6 kg) 274 lb 3.2 oz (124.4 kg) 272 lb 12.8 oz (123.7 kg)    Telemetry    SR, no further ventricular ectopy - Personally Reviewed  ECG    SR, rate 63, QTc this morning - Personally Reviewed  Physical Exam   GEN: No acute distress.   Neck: No JVD Cardiac: RRR, no murmurs, rubs, or gallops.  Respiratory: Clear to auscultation  bilaterally. GI: Soft, nontender, non-distended, obese MS: No edema; No deformity. Neuro:  Nonfocal  Psych: Normal affect   Labs    Chemistry  Recent Labs Lab 03/05/17 0223 03/06/17 0346 03/07/17 0301  NA 135 134* 134*  K 5.0 4.5 4.6  CL 96* 96* 97*  CO2 31 31 29   GLUCOSE 288* 191* 200*  BUN 17 19 22*  CREATININE 1.20 1.13 1.37*  CALCIUM 9.3 9.0 9.1  GFRNONAA >60 >60 55*  GFRAA >60 >60 >60  ANIONGAP 8 7 8       Radiology    No results found.  Cardiac Studies   No new studies  Patient Profile     61 y.o. male with PMHx of CAD, DM, morbid obesity, HTN, persistent AFib, here for tikosyn initiation  Assessment & Plan    1.  Persistent atrial fibrillation Converted to SR after 1st dose of Tikosyn K, Mg stable Continue Eliquis long term for CHADS2VASC of  3 His QT remains prolonged this morning, will decrease Tikosyn to 125mcg twice daily today. Hopefully EKG's will remain stable and can discharge tomorrow. AF clinic follow up appointment made and entered in AVS.  Echo ordered, not been able to be done recently 2/2 AF with RVR. Would obtain prior to discharge, echo lab called this morning.   2. HTN Stable No change required today  3. DM Appreciate diabetes coordinator recommendations Will need outpatient follow up with PCP   Dr Johney FrameAllred to see later today   Signed, Gypsy BalsamAmber Seiler, NP  03/07/2017, 7:33 AM      I have seen, examined the patient, and reviewed the above assessment and plan.  On exam, RRR.  Changes to above are made where necessary.  QT remains prolonged.  Will decrease tikosyn to 125 mcg BID today.  Anticipate discharge to home tomorrow if QT is stable.  Co Sign: Hillis RangeJames Chevella Pearce, MD 03/07/2017 3:00 PM

## 2017-03-08 DIAGNOSIS — Z79899 Other long term (current) drug therapy: Secondary | ICD-10-CM

## 2017-03-08 DIAGNOSIS — Z5181 Encounter for therapeutic drug level monitoring: Secondary | ICD-10-CM

## 2017-03-08 LAB — GLUCOSE, CAPILLARY: GLUCOSE-CAPILLARY: 228 mg/dL — AB (ref 65–99)

## 2017-03-08 MED ORDER — AMIODARONE HCL 200 MG PO TABS
400.0000 mg | ORAL_TABLET | Freq: Two times a day (BID) | ORAL | Status: DC
  Administered 2017-03-08: 400 mg via ORAL
  Filled 2017-03-08: qty 2

## 2017-03-08 MED ORDER — AMIODARONE HCL 200 MG PO TABS: 400.0000 mg | ORAL_TABLET | Freq: Two times a day (BID) | ORAL | 3 refills | Status: DC

## 2017-03-08 NOTE — Discharge Summary (Signed)
Discharge Summary    Patient ID: Lawrence Delgado,  MRN: [id number], DOB/AGE: 04-11-1956 61 y.o.  Admit date: 03/04/2017 Discharge date: 03/08/2017  Primary Care Provider: [name], Cynthia Primary Cardiologist: Dr. Jens Somrenshaw Primary Electrophysiologist: Dr. Johney FrameAllred  Discharge Diagnoses    Principal Problem:   Persistent atrial fibrillation [facility](HCC) Active Problems:   Visit for monitoring Tikosyn therapy   History of Present Illness     Lawrence GearingJoseph R Lawrence Delgado is a 61 y.o. male with past medical history of paroxysmal atrial fibrillation (on Eliquis), HTN, HLD, and GERD who was examined in the atrial fibrillation clinic on 03/04/2017 and was noted to still be in atrial fibrillation with HR in the 130's. Options were reviewed with the patient and initiation of Tikosyn was recommended.   Hospital Course     Lawrence Delgado [facility]2017 and Mg was found to be low at 1.4, therefore Lawrence Delgado was started on IV supplementation prior to Tikosyn initiation. Lawrence Delgado went into NSR after the first dose was initiated with electrolytes and QT interval remaining stable.   On 03/06/2017, his QT interval was found to be prolonged at 562 ms, therefore his AM dose was held. That afternoon, his QTc was improved and Lawrence Delgado was restarted on Tikosyn at a reduced dosing of 250 mcg BID. QTc remained prolonged on 8/10, therefore dosing was further reduced to 125 mcg BID. An echocardiogram was performed and showed an EF of 35-40% with diffuse HK, likely tachycardia-mediated.   On 03/08/2017, Lawrence Delgado was examined by Dr. Elberta Fortisamnitz and the decision was made to stop Tikosyn and start Amiodarone. Lawrence Delgado Kendrea Cerritos be initiated on 400mg  BID for 1 week followed by 200mg  daily dosing. Follow-up with the atrial fibrillation clinic has been arranged. Lawrence Delgado was discharged home in stable condition.  _____________  Discharge Vitals Blood pressure (!) 107/58, pulse 64, temperature 98 F (36.7 C), temperature source Oral, resp. rate 20, height 6\' 1"  (1.854 m), weight  270 lb (122.5 kg), SpO2 98 %.  Filed Weights   03/06/17 0534 03/07/17 0512 03/08/17 0650  Weight: 274 lb 3.2 oz (124.4 kg) 272 lb 12.8 oz (123.7 kg) 270 lb (122.5 kg)    Labs & Radiologic Studies     CBC No results for input(s): WBC, NEUTROABS, HGB, HCT, MCV, PLT in the last 72 hours. Basic Metabolic Panel  Recent Labs  03/06/17 0346 03/07/17 0301  NA 134* 134*  K 4.5 4.6  CL 96* 97*  CO2 31 29  GLUCOSE 191* 200*  BUN 19 22*  CREATININE 1.13 1.37*  CALCIUM 9.0 9.1  MG 1.8 1.9   Liver Function Tests No results for input(s): AST, ALT, ALKPHOS, BILITOT, PROT, ALBUMIN in the last 72 hours. No results for input(s): LIPASE, AMYLASE in the last 72 hours. Cardiac Enzymes No results for input(s): CKTOTAL, CKMB, CKMBINDEX, TROPONINI in the last 72 hours. BNP Invalid input(s): POCBNP D-Dimer No results for input(s): DDIMER in the last 72 hours. Hemoglobin A1C No results for input(s): HGBA1C in the last 72 hours. Fasting Lipid Panel No results for input(s): CHOL, HDL, LDLCALC, TRIG, CHOLHDL, LDLDIRECT in the last 72 hours. Thyroid Function Tests No results for input(s): TSH, T4TOTAL, T3FREE, THYROIDAB in the last 72 hours.  Invalid input(s): FREET3  No results found.   Diagnostic Studies/Procedures    Echocardiogram: 03/07/2017 Study Conclusions  - Left ventricle: The cavity size was mildly dilated. Systolic   function was normal. Features are consistent with a pseudonormal   left ventricular filling pattern, with  concomitant abnormal   relaxation and increased filling pressure (grade 2 diastolic   dysfunction). Doppler parameters are consistent with elevated   ventricular end-diastolic filling pressure. - Right ventricle: Systolic function was normal. - Tricuspid valve: There was mild regurgitation. - Pulmonary arteries: Systolic pressure was within the normal   range. - Inferior vena cava: The vessel was normal in size. - Pericardium, extracardiac: There was no  pericardial effusion.  Impressions:  - Poor echo image quality even with echocontrast. LVEF estimated at   35-40% with hypokinesis in the mid and apical walls.     Disposition   Pt is being discharged home today in good condition.  Follow-up Plans & Appointments    Follow-up Information    Newman Nip, NP Follow up on 03/14/2017.   Specialties:  Nurse Practitioner, Cardiology Why:  Hospital Follow-Up on 03/14/2017 at 10:00AM Contact information: [address] [place] [address] [phone]            Discharge Medications     Medication List    TAKE these medications   amiodarone 200 MG tablet Commonly known as:  PACERONE Take 2 tablets (400 mg total) by mouth 2 (two) times daily. Starting on 03/15/17, reduce to 1 tablet (200mg ) once daily.   apixaban 5 MG Tabs tablet Commonly known as:  ELIQUIS Take 1 tablet (5 mg total) by mouth 2 (two) times daily.   budesonide-formoterol 80-4.5 MCG/ACT inhaler Commonly known as:  SYMBICORT Inhale 2 puffs into the lungs 2 (two) times daily.   diltiazem 120 MG 24 hr capsule Commonly known as:  CARDIZEM CD Take 1 capsule (120 mg total) by mouth daily.   furosemide 40 MG tablet Commonly known as:  LASIX TAKE 1 TABLET DAILY What changed:  See the new instructions.   insulin lispro 100 UNIT/ML injection Commonly known as:  HUMALOG Inject 7-17 Units into the skin 3 (three) times daily before meals. Sliding scale   insulin lispro protamine-lispro (75-25) 100 UNIT/ML Susp injection Commonly known as:  HUMALOG 75/25 MIX Inject 50 Units into the skin 2 (two) times daily with a meal.   irbesartan 150 MG tablet Commonly known as:  AVAPRO TAKE 1 TABLET DAILY What changed:  See the new instructions.   magnesium oxide 400 MG tablet Commonly known as:  MAG-OX Take 400 mg by mouth daily.   metFORMIN 500 MG tablet Commonly known as:  GLUCOPHAGE Take 500 mg by mouth 2 (two) times daily with a meal.   metoprolol  succinate 50 MG 24 hr tablet Commonly known as:  TOPROL-XL Take 4 tablets (200 mg total) by mouth daily. Take with or immediately following a meal.   nitroGLYCERIN 0.4 MG SL tablet Commonly known as:  NITROSTAT Place 1 tablet (0.4 mg total) under the tongue every 5 (five) minutes as needed.   potassium chloride 10 MEQ tablet Commonly known as:  K-DUR,KLOR-CON Take 20 mEq by mouth daily.   rosuvastatin 40 MG tablet Commonly known as:  CRESTOR TAKE 1 TABLET DAILY What changed:  See the new instructions.   spironolactone 25 MG tablet Commonly known as:  ALDACTONE Take 25 mg by mouth daily.   Vitamin D (Ergocalciferol) 50000 units Caps capsule Commonly known as:  DRISDOL Take 50,000 Units by mouth 2 (two) times a week. Monday and Thursday       Allergies Allergies  Allergen Reactions  . Codeine     Liquid for caused REACTION: Nausea/vomiting  . Clopidogrel Bisulfate Itching and Rash  Outstanding Labs/Studies   Routine Amiodarone Labs including TSH, LFT's, PFT's  Duration of Discharge Encounter   Greater than 30 minutes including physician time.  Signed, Ellsworth Lennox, PA-C 03/08/2017, 10:47 AM  I have seen and examined this patient with [place].  Agree with above, note added to reflect my findings.  On exam, RRR, no murmurs, lungs clear. Noted to the hospital for Tikosyn loading. With adjusting medications, QTc continues to remain prolonged. QTC on last night's EKG was 530 ms. Plan for discharge today off of Tikosyn. We'll start on amiodarone and have him follow-up in atrial fibrillation clinic.    Zayli Villafuerte M. Morgan Rennert MD 03/08/2017 11:02 AM

## 2017-03-11 ENCOUNTER — Telehealth: Payer: Self-pay | Admitting: Cardiology

## 2017-03-11 NOTE — Telephone Encounter (Signed)
New message     Pt would like to know if you received his hospital test results, he has a few questions he would like to go over with you

## 2017-03-11 NOTE — Telephone Encounter (Signed)
S/w pt he states that he was in [facility] then admitted for AFIB but it was cancelled and he started amiodarone. He is calling asking for "what the plan is if this does not work" informed pt to discuss this at Rudi CocoDonna carroll appt Friday and to discuss at this appt and in [place] f/u appt.

## 2017-03-13 ENCOUNTER — Other Ambulatory Visit (HOSPITAL_COMMUNITY): Payer: Self-pay | Admitting: Cardiology

## 2017-03-13 NOTE — Telephone Encounter (Signed)
Yes he was just discharged and it is on that paperwork

## 2017-03-14 ENCOUNTER — Ambulatory Visit (HOSPITAL_COMMUNITY)
Admit: 2017-03-14 | Discharge: 2017-03-14 | Disposition: A | Discharge status: Home / Self Care | Payer: Medicare Other | Attending: Nurse Practitioner | Admitting: Nurse Practitioner

## 2017-03-14 ENCOUNTER — Encounter (HOSPITAL_COMMUNITY): Payer: Self-pay | Admitting: Nurse Practitioner

## 2017-03-14 VITALS — BP 116/72 | HR 48 | Ht 73.0 in | Wt 279.2 lb

## 2017-03-14 DIAGNOSIS — I4581 Long QT syndrome: Secondary | ICD-10-CM | POA: Diagnosis not present

## 2017-03-14 DIAGNOSIS — G4730 Sleep apnea, unspecified: Secondary | ICD-10-CM | POA: Insufficient documentation

## 2017-03-14 DIAGNOSIS — Z87891 Personal history of nicotine dependence: Secondary | ICD-10-CM | POA: Diagnosis not present

## 2017-03-14 DIAGNOSIS — I2510 Atherosclerotic heart disease of native coronary artery without angina pectoris: Secondary | ICD-10-CM | POA: Diagnosis not present

## 2017-03-14 DIAGNOSIS — E119 Type 2 diabetes mellitus without complications: Secondary | ICD-10-CM | POA: Diagnosis not present

## 2017-03-14 DIAGNOSIS — E669 Obesity, unspecified: Secondary | ICD-10-CM | POA: Diagnosis not present

## 2017-03-14 DIAGNOSIS — R001 Bradycardia, unspecified: Secondary | ICD-10-CM | POA: Diagnosis not present

## 2017-03-14 DIAGNOSIS — I481 Persistent atrial fibrillation: Secondary | ICD-10-CM | POA: Insufficient documentation

## 2017-03-14 DIAGNOSIS — I509 Heart failure, unspecified: Secondary | ICD-10-CM | POA: Insufficient documentation

## 2017-03-14 DIAGNOSIS — I252 Old myocardial infarction: Secondary | ICD-10-CM | POA: Insufficient documentation

## 2017-03-14 DIAGNOSIS — J984 Other disorders of lung: Secondary | ICD-10-CM | POA: Diagnosis not present

## 2017-03-14 DIAGNOSIS — Z955 Presence of coronary angioplasty implant and graft: Secondary | ICD-10-CM | POA: Diagnosis not present

## 2017-03-14 DIAGNOSIS — I4819 Other persistent atrial fibrillation: Secondary | ICD-10-CM

## 2017-03-14 DIAGNOSIS — Z794 Long term (current) use of insulin: Secondary | ICD-10-CM | POA: Insufficient documentation

## 2017-03-14 DIAGNOSIS — I110 Hypertensive heart disease with heart failure: Secondary | ICD-10-CM | POA: Diagnosis not present

## 2017-03-14 DIAGNOSIS — K219 Gastro-esophageal reflux disease without esophagitis: Secondary | ICD-10-CM | POA: Insufficient documentation

## 2017-03-14 DIAGNOSIS — E785 Hyperlipidemia, unspecified: Secondary | ICD-10-CM | POA: Insufficient documentation

## 2017-03-14 LAB — COMPREHENSIVE METABOLIC PANEL
ALK PHOS: 37 U/L — AB (ref 38–126)
ALT: 16 U/L — AB (ref 17–63)
AST: 19 U/L (ref 15–41)
Albumin: 3.5 g/dL (ref 3.5–5.0)
Anion gap: 8 (ref 5–15)
BILIRUBIN TOTAL: 0.7 mg/dL (ref 0.3–1.2)
BUN: 30 mg/dL — AB (ref 6–20)
CALCIUM: 10.4 mg/dL — AB (ref 8.9–10.3)
CO2: 27 mmol/L (ref 22–32)
CREATININE: 1.67 mg/dL — AB (ref 0.61–1.24)
Chloride: 98 mmol/L — ABNORMAL LOW (ref 101–111)
GFR, EST AFRICAN AMERICAN: 50 mL/min — AB (ref >60)
GFR, EST NON AFRICAN AMERICAN: 43 mL/min — AB (ref >60)
Glucose, Bld: 273 mg/dL — ABNORMAL HIGH (ref 65–99)
Potassium: 6 mmol/L — ABNORMAL HIGH (ref 3.5–5.1)
Sodium: 133 mmol/L — ABNORMAL LOW (ref 135–145)
TOTAL PROTEIN: 6.8 g/dL (ref 6.5–8.1)

## 2017-03-14 LAB — TSH: TSH: 1.152 u[IU]/mL (ref 0.350–4.500)

## 2017-03-14 LAB — MAGNESIUM: MAGNESIUM: 1.2 mg/dL — AB (ref 1.7–2.4)

## 2017-03-14 MED ORDER — METOPROLOL SUCCINATE ER 50 MG PO TB24: 150.0000 mg | ORAL_TABLET | Freq: Every day | ORAL | 3 refills | Status: DC

## 2017-03-14 MED ORDER — AMIODARONE HCL 200 MG PO TABS: 200.0000 mg | ORAL_TABLET | Freq: Every day | ORAL | 3 refills | Status: DC

## 2017-03-14 NOTE — Progress Notes (Signed)
Primary Care Physician: Samuel Jester, DO Referring Physician: Dr. Loraine Leriche is a 61 y.o. 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 Admitted with new onset atrial fibrillation 10/16. TSH normal. Echocardiogram October 2016 was technically difficult. With Definity ejection fraction was felt to be 20%. Transesophageal echocardiogram October 2016 showed ejection fraction 35-40% and mild mitral regurgitation. Nuclear study 11/16 showed EF 43, prior anterior apical infarct and no ischemia.  He was seen in the ER with rapid afib at 161 bpm and was successfully cardioverted. He was seen initially in afib clinic, 09/30/16, he is in SR, EKG shows sinus brady at 57 bpm. He is asking for medications that can help him stay in rhythm. He wears oxygen and was recently evaluated by a pulmonologist who felt his issues were most likely do to COPD, smoking for many years, but quitting in 2005 and heart failure. He denies using alcohol, mod caffeine, uses cpap. Morbidly obese and sedentary. Left atrium was normal in size in 2016.  Pt was offered Tikosyn at that appointment but he changed his mind saying that he is scared of the drug. He went on to have persisitent afib and recently had an unsuccessful cardioversion.  He is now back in the afib clinic, 7/27, to discuss hospitalization for tikosyn.He is still having RVR at 141 bpm, despite having BB increased to 50 mg tid. He is pending an echo this am but with RVR would not be a good time to get this. Will cancel and plan to get in hospital or when HR is better controlled/SR.Pt is now ready to plan for hospitalization next Tuesday to come in for tikosyn. No missed doses of apixaban. No use of benadryl.  F/u in afib clinic, 8/17, f/u hospitalization for tikosyn. Unfortunately, pt's qtc lengthened and  despite lowering tikosyn dose, he could not safely stay on drug. He was loaded on amiodarone and is  here for f/u. He is in SR but slow at 48 bpm. He does not feel appreciably better.  Today, he denies symptoms of palpitations, chest pain,  orthopnea, PND, lower extremity edema, dizziness, presyncope, syncope, or neurologic sequela. Positive for chronic shortness of breath. The patient is tolerating medications without difficulties and is otherwise without complaint today.   Past Medical History:  Diagnosis Date  . Arthritis    " In my knees   . Atrial fibrillation (HCC) 02/2017  . CAD (coronary artery disease)   . CHF (congestive heart failure) (HCC)   . DM (diabetes mellitus) (HCC)    insulin dependent  . GERD (gastroesophageal reflux disease)   . HLD (hyperlipidemia)   . HTN (hypertension)   . MI (myocardial infarction) (HCC)   . Obesity   . Sleep apnea    Past Surgical History:  Procedure Laterality Date  . CARDIAC CATHETERIZATION     with coronary angiography   . CARDIOVERSION N/A 05/05/2015   Procedure: CARDIOVERSION;  Surgeon: Vesta Mixer, MD;  Location[facility] ENDOSCOPY;  Service: Cardiovascular;  Laterality: N/A;  . CARDIOVERSION N/A 02/14/2017   Procedure: CARDIOVERSION;  Surgeon: Vesta Mixer, MD;  Location: [facility] ENDOSCOPY;  Service: Cardiovascular;  Laterality: N/A;  . CORONARY STENT PLACEMENT     eluting stent placement in the culpric lesion of the LAD  . TEE WITHOUT CARDIOVERSION N/A 05/05/2015   Procedure: TRANSESOPHAGEAL ECHOCARDIOGRAM (TEE);  Surgeon: Vesta Mixer, MD;  Location: [facility] ENDOSCOPY;  Service:  Cardiovascular;  Laterality: N/A;    Current Outpatient Prescriptions  Medication Sig Dispense Refill  . amiodarone (PACERONE) 200 MG tablet Take 1 tablet (200 mg total) by mouth daily. 60 tablet 3  . apixaban (ELIQUIS) 5 MG TABS tablet Take 1 tablet (5 mg total) by mouth 2 (two) times daily. 180 tablet 3  . diltiazem (CARDIZEM CD) 120 MG 24 hr capsule Take 1 capsule (120 mg total) by mouth daily. 30 capsule 6  . furosemide (LASIX) 40 MG tablet TAKE 1 TABLET  DAILY (Patient taking differently: TAKE 1 TABLET (40mg ) BY MOUTH DAILY) 90 tablet 3  . insulin lispro (HUMALOG) 100 UNIT/ML injection Inject 7-17 Units into the skin 3 (three) times daily before meals. Sliding scale    . insulin lispro protamine-lispro (HUMALOG 75/25 MIX) (75-25) 100 UNIT/ML SUSP injection Inject 50 Units into the skin 2 (two) times daily with a meal.     . irbesartan (AVAPRO) 150 MG tablet TAKE 1 TABLET DAILY (Patient taking differently: TAKE 1 TABLET (150mg ) BY MOUTH DAILY) 90 tablet 3  . magnesium oxide (MAG-OX) 400 (241.3 Mg) MG tablet TAKE 1 TABLET DAILY 90 tablet 0  . metFORMIN (GLUCOPHAGE) 500 MG tablet Take 500 mg by mouth 2 (two) times daily with a meal.     . metoprolol succinate (TOPROL-XL) 50 MG 24 hr tablet Take 3 tablets (150 mg total) by mouth daily. 270 tablet 3  . nitroGLYCERIN (NITROSTAT) 0.4 MG SL tablet Place 1 tablet (0.4 mg total) under the tongue every 5 (five) minutes as needed. 25 tablet 5  . potassium chloride (K-DUR) 10 MEQ tablet TAKE (2) TABLETS DAILY 60 tablet 2  . rosuvastatin (CRESTOR) 40 MG tablet TAKE 1 TABLET DAILY (Patient taking differently: TAKE 1 TABLET (40mg ) BY MOUTH DAILY) 90 tablet 3  . spironolactone (ALDACTONE) 25 MG tablet TAKE 1 TABLET DAILY 90 tablet 0  . Vitamin D, Ergocalciferol, (DRISDOL) 50000 UNITS CAPS Take 50,000 Units by mouth 2 (two) times a week. Monday and Thursday    . budesonide-formoterol (SYMBICORT) 80-4.5 MCG/ACT inhaler Inhale 2 puffs into the lungs 2 (two) times daily. (Patient not taking: Reported on 03/14/2017) 1 Inhaler 12   No current facility-administered medications for this encounter.     Allergies  Allergen Reactions  . Codeine     Liquid for caused REACTION: Nausea/vomiting  . Clopidogrel Bisulfate Itching and Rash    Social History   Social History  . Marital status: Married    Spouse name: N/A  . Number of children: N/A  . Years of education: N/A   Occupational History  . Not on file.    Social History Main Topics  . Smoking status: Former Smoker    Packs/day: 3.00    Years: 25.00  . Smokeless tobacco: Never Used     Comment: quit smoking in 2005  . Alcohol use No  . Drug use: No  . Sexual activity: Not on file   Other Topics Concern  . Not on file   Social History Narrative  . No narrative on file    Family History  Problem Relation Age of Onset  . Heart attack Mother   . Heart attack Father     ROS- All systems are reviewed and negative except as per the HPI above  Physical Exam: Vitals:   03/14/17 0951  BP: 116/72  Pulse: (!) 48  Weight: 279 lb 3.2 oz (126.6 kg)  Height: 6\' 1"  (1.854 m)   Wt Readings from Last 3 Encounters:  03/14/17 279 lb 3.2 oz (126.6 kg)  03/08/17 270 lb (122.5 kg)  03/04/17 280 lb 12.8 oz (127.4 kg)    Labs: Lab Results  Component Value Date   NA 133 (L) 03/14/2017   K 6.0 (H) 03/14/2017   CL 98 (L) 03/14/2017   CO2 27 03/14/2017   GLUCOSE 273 (H) 03/14/2017   BUN 30 (H) 03/14/2017   CREATININE 1.67 (H) 03/14/2017   CALCIUM 10.4 (H) 03/14/2017   MG 1.2 (L) 03/14/2017   No results found for: INR Lab Results  Component Value Date   CHOL 142 05/13/2013   HDL 39.20 05/13/2013   LDLCALC 63 05/13/2013   TRIG 197.0 (H) 05/13/2013     GEN- The patient is chronically ill appearing, alert and oriented x 3 today.  Wearing O2 by [place] Head- normocephalic, atraumatic Eyes-  Sclera clear, conjunctiva pink Ears- hearing intact Oropharynx- clear Neck- supple, no JVP Lymph- no cervical lymphadenopathy Lungs- Clear to ausculation bilaterally, normal work of breathing Heart- regular rate and rhythm, no murmurs, rubs or gallops, PMI not laterally displaced GI- soft, NT, ND, + BS Extremities- no clubbing, cyanosis, or edema MS- no significant deformity or atrophy Skin- no rash or lesion Psych- euthymic mood, full affect Neuro- strength and sensation are intact  EKG-S brady at 48 bpm, Pr int 188 ms, qrs int 80 ms, qtc  437 ms Epic records reviewed Echo-Study Conclusions  - Left ventricle: The cavity size was mildly dilated. Systolic   function was normal. Features are consistent with a pseudonormal   left ventricular filling pattern, with concomitant abnormal   relaxation and increased filling pressure (grade 2 diastolic   dysfunction). Doppler parameters are consistent with elevated   ventricular end-diastolic filling pressure. - Right ventricle: Systolic function was normal. - Tricuspid valve: There was mild regurgitation. - Pulmonary arteries: Systolic pressure was within the normal   range. - Inferior vena cava: The vessel was normal in size. - Pericardium, extracardiac: There was no pericardial effusion.  Impressions:  - Poor echo image quality even with echocontrast. LVEF estimated at   35-40% with hypokinesis in the mid and apical walls.  PFT's- 7/11- Severe restriction, interstitial Moderate severe diffusion defect that corrects for alveolar volume  Assessment and Plan: 1. Persistent  afib In Sinus brady today   Failed Tikosyn for prolonged qt Sent home with amiodarone 400 mg bid x one week He will now go to 200 mg amiodarone daily Decrease metoprolol to 150 mg daily CMET/TSH today  2. Lung disease I am concerned re PFT's reporting interstitial disease with amio on board Will send this to attention of Dr. Isaiah Serge for his opinion if [facility] to  continue with amio May need to be considered for ablation F/u next in 2 weeks   Lupita Leash C. Matthew Folks [facility] [address] [place], [place] [address] [phone]

## 2017-03-14 NOTE — Patient Instructions (Addendum)
Your physician has recommended you make the following change in your medication:  1)Decrease Amiodarone to 200mg  once a day 2)Decrease Metoprolol to 150mg  once a day (3 of your 50mg  tablets)

## 2017-03-17 ENCOUNTER — Telehealth: Payer: Self-pay | Admitting: Pulmonary Disease

## 2017-03-17 DIAGNOSIS — J849 Interstitial pulmonary disease, unspecified: Secondary | ICD-10-CM

## 2017-03-17 NOTE — Telephone Encounter (Signed)
Pt is requesting the results from his PFT that was done last month.  PM - please advise. Thanks.

## 2017-03-18 ENCOUNTER — Encounter: Payer: Self-pay | Admitting: Cardiology

## 2017-03-18 NOTE — Telephone Encounter (Signed)
-----   Message from Chilton Greathouse, MD sent at 03/14/2017  6:13 PM EDT ----- We ordered a high res CT earlier this year to eval for lung disease but looks like it is not done yet.  Jazmin Ley- Can you check the status of this and make a follow up appointment with me. Thanks  Praveen Mannam  ----- Message ----- From: Newman Nip, NP Sent: 03/14/2017  12:15 PM To: Lewayne Bunting, MD, Chilton Greathouse, MD

## 2017-03-18 NOTE — Telephone Encounter (Signed)
New message   Pt wife is calling and wants to know what is going on with her husbands test. She wants a call back today.

## 2017-03-18 NOTE — Telephone Encounter (Signed)
Please see 02/14/17 in regards to CT high res. Called and spoke with pt. Pt has been scheduled for OV with PM on 04/28/17. I have offered sooner apt pt declined due to transportation.  CT high res has been ordered.  Nothing further needed.

## 2017-03-18 NOTE — Telephone Encounter (Signed)
Duplicate documentation This encounter was created in error - please disregard.

## 2017-03-25 ENCOUNTER — Ambulatory Visit (HOSPITAL_COMMUNITY): Payer: Medicare Other

## 2017-04-01 ENCOUNTER — Ambulatory Visit (HOSPITAL_COMMUNITY): Payer: Medicare Other | Admitting: Nurse Practitioner

## 2017-04-03 ENCOUNTER — Ambulatory Visit (HOSPITAL_COMMUNITY)
Admission: RE | Admit: 2017-04-03 | Discharge: 2017-04-03 | Disposition: A | Discharge status: Home / Self Care | Payer: Medicare Other | Source: Ambulatory Visit | Attending: Pulmonary Disease | Admitting: Pulmonary Disease

## 2017-04-03 DIAGNOSIS — R938 Abnormal findings on diagnostic imaging of other specified body structures: Secondary | ICD-10-CM | POA: Insufficient documentation

## 2017-04-03 DIAGNOSIS — I2510 Atherosclerotic heart disease of native coronary artery without angina pectoris: Secondary | ICD-10-CM | POA: Insufficient documentation

## 2017-04-03 DIAGNOSIS — R918 Other nonspecific abnormal finding of lung field: Secondary | ICD-10-CM | POA: Diagnosis not present

## 2017-04-03 DIAGNOSIS — I700 Atherosclerosis of aorta: Secondary | ICD-10-CM | POA: Diagnosis not present

## 2017-04-03 DIAGNOSIS — J432 Centrilobular emphysema: Secondary | ICD-10-CM | POA: Insufficient documentation

## 2017-04-03 DIAGNOSIS — J849 Interstitial pulmonary disease, unspecified: Secondary | ICD-10-CM

## 2017-04-04 ENCOUNTER — Telehealth: Payer: Self-pay | Admitting: Pulmonary Disease

## 2017-04-04 DIAGNOSIS — R918 Other nonspecific abnormal finding of lung field: Secondary | ICD-10-CM

## 2017-04-04 NOTE — Telephone Encounter (Signed)
Pt is calling for his CT results ---CT was done on 9/6.  PM please advise of these results.  Thanks

## 2017-04-07 NOTE — Telephone Encounter (Signed)
I called the number but there was no answer  Please let the patient know that the CT does not show any interstitial lung disease. There is evidence of emphysema. There is evidence of fluid and atherosclerosis consistent with heart disease There are small lung nodules that do not appear suspicious but can be followed on CT without contrast in 1 year (please order) There may be a thyroid nodule as well. He will need a thyroid ultrasound and can be done by primary care. Forward results to PCP  I am happy to call back and talk to him if he wants.  Chilton GreathousePraveen Latania Bascomb MD [facility] 04/07/2017, 12:06 PM

## 2017-04-07 NOTE — Telephone Encounter (Signed)
Pt is aware of results. Pt would like to discuss results further with PM, as he has additional questions. CT has been ordered for one year. 04/03/17 CT [facility].  PM please advise. Thanks.

## 2017-04-08 ENCOUNTER — Telehealth: Payer: Self-pay | Admitting: Cardiology

## 2017-04-08 ENCOUNTER — Inpatient Hospital Stay (HOSPITAL_COMMUNITY): Admission: RE | Admit: 2017-04-08 | Payer: Medicare Other | Source: Ambulatory Visit | Admitting: Nurse Practitioner

## 2017-04-08 NOTE — Telephone Encounter (Signed)
Dr. Isaiah SergeMannam Lawrence Delgado states you can call himt today at any time and tomorrow after 7am. Please call him at [phone].

## 2017-04-08 NOTE — Telephone Encounter (Signed)
I called but got the voice mail. I left a message requesting him to let us know a time and number where he can be reached.  Chilton GreathousePraveen Teigen Bellin MD [facility] 04/08/2017, 8:04 AM

## 2017-04-08 NOTE — Telephone Encounter (Signed)
New Message     Pt tried to call hayley back

## 2017-04-08 NOTE — Telephone Encounter (Signed)
Patient is returning phone call.  °

## 2017-04-08 NOTE — Telephone Encounter (Signed)
Returned call to patient who reports he had CT chest completed and it showed fluid around his heart and he was wondering what he needs to do about it.     Per chart review CT results favored pulmonary edema, cardiomegaly (results in Epic).  Pulmonology reviewed results with patient per telephone note.   Discussed this with patient.   Patient denies swelling or increased SOB (reports he gets SOB with exertion without his O2).   Reports he has not been weighing himself as well but will start this back.     Patient is taking 40mg  lasix daily and 12.5 spironolactone daily.      Advised will route to Dr. Jens Somrenshaw for review and/or recommendations.   Advised to weigh himself daily, monitor salt and fluid intake, and he may need OV to assess.

## 2017-04-08 NOTE — Telephone Encounter (Signed)
Patient aware and verbalized understanding. °

## 2017-04-08 NOTE — Telephone Encounter (Signed)
Agree with plan to continue diuretics; low Na diet; fluid restrict to 1 liter daily Olga MillersBrian Amila Callies

## 2017-04-08 NOTE — Telephone Encounter (Signed)
I called and answered his questions

## 2017-04-08 NOTE — Telephone Encounter (Signed)
ATC pt, no answer. Left message to call back to receive his CT results.

## 2017-04-08 NOTE — Telephone Encounter (Signed)
Please call,pt wants to know what is going to be done about the fluid found around his heart from his test results.

## 2017-04-08 NOTE — Telephone Encounter (Signed)
Attempt to call back, line busy

## 2017-04-15 ENCOUNTER — Ambulatory Visit (HOSPITAL_COMMUNITY)
Admission: RE | Admit: 2017-04-15 | Discharge: 2017-04-15 | Disposition: A | Discharge status: Home / Self Care | Payer: Medicare Other | Source: Ambulatory Visit | Attending: Nurse Practitioner | Admitting: Nurse Practitioner

## 2017-04-15 ENCOUNTER — Encounter (HOSPITAL_COMMUNITY): Payer: Self-pay | Admitting: Nurse Practitioner

## 2017-04-15 VITALS — BP 120/68 | HR 54 | Ht 73.0 in | Wt 284.0 lb

## 2017-04-15 DIAGNOSIS — Z87891 Personal history of nicotine dependence: Secondary | ICD-10-CM | POA: Diagnosis not present

## 2017-04-15 DIAGNOSIS — I252 Old myocardial infarction: Secondary | ICD-10-CM | POA: Insufficient documentation

## 2017-04-15 DIAGNOSIS — Z955 Presence of coronary angioplasty implant and graft: Secondary | ICD-10-CM | POA: Insufficient documentation

## 2017-04-15 DIAGNOSIS — I700 Atherosclerosis of aorta: Secondary | ICD-10-CM | POA: Insufficient documentation

## 2017-04-15 DIAGNOSIS — K219 Gastro-esophageal reflux disease without esophagitis: Secondary | ICD-10-CM | POA: Diagnosis not present

## 2017-04-15 DIAGNOSIS — I4581 Long QT syndrome: Secondary | ICD-10-CM | POA: Diagnosis not present

## 2017-04-15 DIAGNOSIS — E119 Type 2 diabetes mellitus without complications: Secondary | ICD-10-CM | POA: Diagnosis not present

## 2017-04-15 DIAGNOSIS — G4730 Sleep apnea, unspecified: Secondary | ICD-10-CM | POA: Diagnosis not present

## 2017-04-15 DIAGNOSIS — E785 Hyperlipidemia, unspecified: Secondary | ICD-10-CM | POA: Diagnosis not present

## 2017-04-15 DIAGNOSIS — I110 Hypertensive heart disease with heart failure: Secondary | ICD-10-CM | POA: Insufficient documentation

## 2017-04-15 DIAGNOSIS — I4891 Unspecified atrial fibrillation: Secondary | ICD-10-CM | POA: Insufficient documentation

## 2017-04-15 DIAGNOSIS — Z794 Long term (current) use of insulin: Secondary | ICD-10-CM | POA: Diagnosis not present

## 2017-04-15 DIAGNOSIS — I481 Persistent atrial fibrillation: Secondary | ICD-10-CM

## 2017-04-15 DIAGNOSIS — I2510 Atherosclerotic heart disease of native coronary artery without angina pectoris: Secondary | ICD-10-CM | POA: Insufficient documentation

## 2017-04-15 DIAGNOSIS — R918 Other nonspecific abnormal finding of lung field: Secondary | ICD-10-CM | POA: Diagnosis not present

## 2017-04-15 DIAGNOSIS — J432 Centrilobular emphysema: Secondary | ICD-10-CM | POA: Insufficient documentation

## 2017-04-15 DIAGNOSIS — I4819 Other persistent atrial fibrillation: Secondary | ICD-10-CM

## 2017-04-15 LAB — COMPREHENSIVE METABOLIC PANEL
ALT: 16 U/L — AB (ref 17–63)
AST: 17 U/L (ref 15–41)
Albumin: 3.4 g/dL — ABNORMAL LOW (ref 3.5–5.0)
Alkaline Phosphatase: 43 U/L (ref 38–126)
Anion gap: 7 (ref 5–15)
BILIRUBIN TOTAL: 0.7 mg/dL (ref 0.3–1.2)
BUN: 18 mg/dL (ref 6–20)
CALCIUM: 9.4 mg/dL (ref 8.9–10.3)
CHLORIDE: 95 mmol/L — AB (ref 101–111)
CO2: 32 mmol/L (ref 22–32)
CREATININE: 1.62 mg/dL — AB (ref 0.61–1.24)
GFR, EST AFRICAN AMERICAN: 52 mL/min — AB (ref >60)
GFR, EST NON AFRICAN AMERICAN: 45 mL/min — AB (ref >60)
Glucose, Bld: 358 mg/dL — ABNORMAL HIGH (ref 65–99)
Potassium: 4.5 mmol/L (ref 3.5–5.1)
Sodium: 134 mmol/L — ABNORMAL LOW (ref 135–145)
TOTAL PROTEIN: 7.4 g/dL (ref 6.5–8.1)

## 2017-04-15 LAB — MAGNESIUM: MAGNESIUM: 1.6 mg/dL — AB (ref 1.7–2.4)

## 2017-04-15 NOTE — Progress Notes (Signed)
Primary Care Physician: Samuel Jester, DO Referring Physician: Dr. Loraine Leriche is a 61 y.o. 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 Admitted with new onset atrial fibrillation 10/16. TSH normal. Echocardiogram October 2016 was technically difficult. With Definity ejection fraction was felt to be 20%. Transesophageal echocardiogram October 2016 showed ejection fraction 35-40% and mild mitral regurgitation. Nuclear study 11/16 showed EF 43, prior anterior apical infarct and no ischemia.  He was seen in the ER with rapid afib, 09/18/16 at 161 bpm and was successfully cardioverted. He was seen initially in afib clinic, 09/30/16, he was in SR, EKG showed sinus brady at 57 bpm. He asked for medications that could help him stay in rhythm. He wears oxygen and was recently evaluated by a pulmonologist who felt his issues were most likely do to COPD, smoking for many years, but quit in 2005 and heart failure. He denies using alcohol, mod caffeine, uses cpap. Morbidly obese and sedentary. Left atrium was normal in size in 2016.  Pt was offered Tikosyn at that appointment but he changed his mind saying that he is scared of the drug. He went on to have persisitent afib and recently had an unsuccessful cardioversion.  He presented  back in the afib clinic, 02/21/17, to discuss hospitalization for tikosyn.He is still having RVR at 141 bpm, despite having BB increased to 50 mg tid. He is pending an echo this am but with RVR would not be a good time to get this. Will cancel and plan to get in hospital or when HR is better controlled/SR.Pt is now ready to plan for hospitalization next Tuesday to come in for tikosyn. No missed doses of apixaban. No use of benadryl.  F/u in afib clinic, 03/14/17, f/u hospitalization for tikosyn. Unfortunately, pt's qtc lengthened and  despite lowering tikosyn dose, he could not safely stay on drug. He was loaded on  amiodarone and is here for f/u. He is in SR but slow at 48 bpm. He does not feel appreciably better.  F/u afib clinic 9/18. EKG show NSR. He recently had a chest CT which showed NO findings of interstitial disease.Patchy ground glass attenuation favored to represent pulmonary edema. I reduced spironolactone to 1/2 tab after labs showed a Kt of 6.0 and creatinine increased to 1.67 one month ago. His weight is up 5 lbs but not sure if truly weight gain or fluid, no obvious fluid by exam. Feels his breathing is improved in SR but still has shortness of breath at times.  Today, he denies symptoms of palpitations, chest pain,  orthopnea, PND, lower extremity edema, dizziness, presyncope, syncope, or neurologic sequela. Positive for chronic shortness of breath. The patient is tolerating medications without difficulties and is otherwise without complaint today.   Past Medical History:  Diagnosis Date  . Arthritis    " In my knees   . Atrial fibrillation (HCC) 02/2017  . CAD (coronary artery disease)   . CHF (congestive heart failure) (HCC)   . DM (diabetes mellitus) (HCC)    insulin dependent  . GERD (gastroesophageal reflux disease)   . HLD (hyperlipidemia)   . HTN (hypertension)   . MI (myocardial infarction) (HCC)   . Obesity   . Sleep apnea    Past Surgical History:  Procedure Laterality Date  . CARDIAC CATHETERIZATION     with coronary angiography   . CARDIOVERSION N/A 05/05/2015   Procedure: CARDIOVERSION;  Surgeon: Vesta Mixer, MD;  Location: [facility] ENDOSCOPY;  Service: Cardiovascular;  Laterality: N/A;  . CARDIOVERSION N/A 02/14/2017   Procedure: CARDIOVERSION;  Surgeon: Vesta Mixer, MD;  Location: [facility] ENDOSCOPY;  Service: Cardiovascular;  Laterality: N/A;  . CORONARY STENT PLACEMENT     eluting stent placement in the culpric lesion of the LAD  . TEE WITHOUT CARDIOVERSION N/A 05/05/2015   Procedure: TRANSESOPHAGEAL ECHOCARDIOGRAM (TEE);  Surgeon: Vesta Mixer, MD;  Location: [facility]  ENDOSCOPY;  Service: Cardiovascular;  Laterality: N/A;    Current Outpatient Prescriptions  Medication Sig Dispense Refill  . amiodarone (PACERONE) 200 MG tablet Take 1 tablet (200 mg total) by mouth daily. 60 tablet 3  . apixaban (ELIQUIS) 5 MG TABS tablet Take 1 tablet (5 mg total) by mouth 2 (two) times daily. 180 tablet 3  . diltiazem (CARDIZEM CD) 120 MG 24 hr capsule Take 1 capsule (120 mg total) by mouth daily. 30 capsule 6  . furosemide (LASIX) 40 MG tablet TAKE 1 TABLET DAILY (Patient taking differently: TAKE 1 TABLET ( ) BY MOUTH DAILY) 90 tablet 3  . insulin lispro (HUMALOG) 100 UNIT/ML injection Inject 7-17 Units into the skin 3 (three) times daily before meals. Sliding scale    . insulin lispro protamine-lispro (HUMALOG 75/25 MIX) (75-25) 100 UNIT/ML SUSP injection Inject 50 Units into the skin 2 (two) times daily with a meal.     . irbesartan (AVAPRO) 150 MG tablet TAKE 1 TABLET DAILY (Patient taking differently: TAKE 1 TABLET ( ) BY MOUTH DAILY) 90 tablet 3  . magnesium oxide (MAG-OX) 400 (241.3 Mg) MG tablet TAKE 1 TABLET DAILY 90 tablet 0  . metFORMIN (GLUCOPHAGE) 500 MG tablet Take 500 mg by mouth 2 (two) times daily with a meal.     . metoprolol succinate (TOPROL-XL) 50 MG 24 hr tablet Take 3 tablets (150 mg total) by mouth daily. 270 tablet 3  . nitroGLYCERIN (NITROSTAT) 0.4 MG SL tablet Place 1 tablet (0.4 mg total) under the tongue every 5 (five) minutes as needed. 25 tablet 5  . potassium chloride (K-DUR) 10 MEQ tablet TAKE (2) TABLETS DAILY 60 tablet 2  . rosuvastatin (CRESTOR) 40 MG tablet TAKE 1 TABLET DAILY (Patient taking differently: TAKE 1 TABLET ( ) BY MOUTH DAILY) 90 tablet 3  . spironolactone (ALDACTONE) 25 MG tablet TAKE 1 TABLET DAILY 90 tablet 0  . Vitamin D, Ergocalciferol, (DRISDOL) 50000 UNITS CAPS Take 50,000 Units by mouth 2 (two) times a week. Monday and Thursday    . budesonide-formoterol (SYMBICORT) 80-4.5 MCG/ACT inhaler Inhale 2 puffs into  the lungs 2 (two) times daily. (Patient not taking: Reported on 03/14/2017) 1 Inhaler 12   No current facility-administered medications for this encounter.     Allergies  Allergen Reactions  . Codeine     Liquid for caused REACTION: Nausea/vomiting  . Clopidogrel Bisulfate Itching and Rash    Social History   Social History  . Marital status: Married    Spouse name: N/A  . Number of children: N/A  . Years of education: N/A   Occupational History  . Not on file.   Social History Main Topics  . Smoking status: Former Smoker    Packs/day: 3.00    Years: 25.00  . Smokeless tobacco: Never Used     Comment: quit smoking in 2005  . Alcohol use No  . Drug use: No  . Sexual activity: Not on file   Other Topics Concern  . Not on file   Social  History Narrative  . No narrative on file    Family History  Problem Relation Age of Onset  . Heart attack Mother   . Heart attack Father     ROS- All systems are reviewed and negative except as per the HPI above  Physical Exam: Vitals:   04/15/17 0858  BP: 120/68  Pulse: (!) 54  Weight: 284 lb (128.8 kg)  Height:  (1.854 m)   Wt Readings from Last 3 Encounters:  04/15/17 284 lb (128.8 kg)  03/14/17 279 lb 3.2 oz (126.6 kg)  03/08/17 270 lb (122.5 kg)    Labs: Lab Results  Component Value Date   NA 134 (L) 04/15/2017   K 4.5 04/15/2017   CL 95 (L) 04/15/2017   CO2 32 04/15/2017   GLUCOSE 358 (H) 04/15/2017   BUN 18 04/15/2017   CREATININE 1.62 (H) 04/15/2017   CALCIUM 9.4 04/15/2017   MG 1.6 (L) 04/15/2017   No results found for: INR Lab Results  Component Value Date   CHOL 142 05/13/2013   HDL 39.20 05/13/2013   LDLCALC 63 05/13/2013   TRIG 197.0 (H) 05/13/2013     GEN- The patient is chronically ill appearing, alert and oriented x 3 today.  Wearing O2 by [place] Head- normocephalic, atraumatic Eyes-  Sclera clear, conjunctiva pink Ears- hearing intact Oropharynx- clear Neck- supple, no JVP Lymph-  no cervical lymphadenopathy Lungs- Clear to ausculation bilaterally, normal work of breathing Heart- regular rate and rhythm, no murmurs, rubs or gallops, PMI not laterally displaced GI- soft, NT, ND, + BS Extremities- no clubbing, cyanosis, or edema MS- no significant deformity or atrophy Skin- no rash or lesion Psych- euthymic mood, full affect Neuro- strength and sensation are intact  EKG-S brady at 54 bpm, Pr int 172 ms, qrs int 78 ms, qtc 451 ms Epic records reviewed Echo-Study Conclusions  - Left ventricle: The cavity size was mildly dilated. Systolic   function was normal. Features are consistent with a pseudonormal   left ventricular filling pattern, with concomitant abnormal   relaxation and increased filling pressure (grade 2 diastolic   dysfunction). Doppler parameters are consistent with elevated   ventricular end-diastolic filling pressure. - Right ventricle: Systolic function was normal. - Tricuspid valve: There was mild regurgitation. - Pulmonary arteries: Systolic pressure was within the normal   range. - Inferior vena cava: The vessel was normal in size. - Pericardium, extracardiac: There was no pericardial effusion.  Impressions:  - Poor echo image quality even with echocontrast. LVEF estimated at   35-40% with hypokinesis in the mid and apical walls.  Chest CT-04/03/17-IMPRESSION: 1. No convincing findings of interstitial lung disease. Interlobular septal thickening and patchy ground-glass attenuation throughout both lungs. Given the cardiomegaly, these findings are favored to represent pulmonary edema. 2. Mild-to-moderate centrilobular emphysema and diffuse bronchial wall thickening, suggesting COPD. 3. Small solid pulmonary nodules, largest 5 mm. No follow-up needed if patient is low-risk (and has no known or suspected primary neoplasm). Non-contrast chest CT can be considered in 12 months if patient is high-risk. This recommendation follows the  consensus statement: Guidelines for Management of Incidental Pulmonary Nodules Detected on CT Images: From the Fleischner Society 2017; Radiology 2017; 284:228-243. 4. Three-vessel coronary atherosclerosis. 5. Questionable 1.6 cm left thyroid lobe nodule, for which thyroid ultrasound correlation is warranted if not previously performed. This follows ACR consensus guidelines: Managing Incidental Thyroid Nodules Detected on Imaging: White Paper of the ACR Incidental Thyroid Findings Committee. J Am Coll Radiol 2015; 12:143-150.  Aortic Atherosclerosis (ICD10-I70.0) and Emphysema (ICD10-J43.9).   PFT's- 7/11- Severe restriction, interstitial Moderate severe diffusion defect that corrects for alveolar volume  Assessment and Plan: 1. Persistent  afib In Sinus since amiodarone loaded  Failed Tikosyn for prolonged qt Continue amiodarone 200 mg qd Continue metoprolol to 150 mg daily Will d/c Cardizem as he is now in SR, BP controlled CMET/TSH today  2. Lung disease Per pulmonology No interstitial disease by recent CT Continue amiodarone    3. LV dysfunction Continue furosemide, Arb Metoprolol as current dose  Avoid salt, limit fluids, daily weights  F/u with Dr. Jens Som 10/3, Dr. Isaiah Serge 10/1 afib clinic as needed    Elvina Sidle. [facility] [address] [place], [place] [address] [phone]

## 2017-04-15 NOTE — Addendum Note (Signed)
Encounter addended by: Shona Simpson, RN on: 04/15/2017  4:15 PM<BR>    Actions taken: Medication long-term status modified, Order list changed

## 2017-04-21 NOTE — Progress Notes (Deleted)
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 Admitted with new onset atrial fibrillation 10/16. TSH normal. Transesophageal echocardiogram October 2016 showed ejection fraction 35-40% and mild mitral regurgitation. Nuclear study 11/16 showed EF 43, prior anterior apical infarct and no ischemia. Had DCCV 09/18/16. Follow-up echocardiogram August 2018 was poor quality but ejection fraction estimated at 35-40%. Chest CT September 2018 showed possible pulmonary edema, COPD, pulmonary nodule with follow-up recommended 12 months, 1.6 cm left thyroid lobe nodule and thyroid ultrasound recommended. Patient was in recurrent atrial fibrillation at last office visit. He was admitted for tikosyn but developed QT prolongation and this medication was discontinued. He was then placed on amiodarone. There is a question of interstitial lung disease but not found on his CT. Since last seen,   Current Outpatient Prescriptions  Medication Sig Dispense Refill  . amiodarone (PACERONE) 200 MG tablet Take 1 tablet (200 mg total) by mouth daily. 60 tablet 3  . apixaban (ELIQUIS) 5 MG TABS tablet Take 1 tablet (5 mg total) by mouth 2 (two) times daily. 180 tablet 3  . budesonide-formoterol (SYMBICORT) 80-4.5 MCG/ACT inhaler Inhale 2 puffs into the lungs 2 (two) times daily. (Patient not taking: Reported on 03/14/2017) 1 Inhaler 12  . furosemide (LASIX) 40 MG tablet TAKE 1 TABLET DAILY (Patient taking differently: TAKE 1 TABLET ( ) BY MOUTH DAILY) 90 tablet 3  . insulin lispro (HUMALOG) 100 UNIT/ML injection Inject 7-17 Units into the skin 3 (three) times daily before meals. Sliding scale    . insulin lispro protamine-lispro (HUMALOG 75/25 MIX) (75-25) 100 UNIT/ML SUSP injection Inject 50 Units into the skin 2 (two) times daily with a meal.     . irbesartan (AVAPRO) 150 MG tablet TAKE 1 TABLET DAILY (Patient taking differently: TAKE 1  TABLET ( ) BY MOUTH DAILY) 90 tablet 3  . magnesium oxide (MAG-OX) 400 (241.3 Mg) MG tablet TAKE 1 TABLET DAILY 90 tablet 0  . metFORMIN (GLUCOPHAGE) 500 MG tablet Take 500 mg by mouth 2 (two) times daily with a meal.     . metoprolol succinate (TOPROL-XL) 50 MG 24 hr tablet Take 3 tablets (150 mg total) by mouth daily. 270 tablet 3  . nitroGLYCERIN (NITROSTAT) 0.4 MG SL tablet Place 1 tablet (0.4 mg total) under the tongue every 5 (five) minutes as needed. 25 tablet 5  . potassium chloride (K-DUR) 10 MEQ tablet TAKE (2) TABLETS DAILY 60 tablet 2  . rosuvastatin (CRESTOR) 40 MG tablet TAKE 1 TABLET DAILY (Patient taking differently: TAKE 1 TABLET ( ) BY MOUTH DAILY) 90 tablet 3  . spironolactone (ALDACTONE) 25 MG tablet TAKE 1 TABLET DAILY 90 tablet 0  . Vitamin D, Ergocalciferol, (DRISDOL) 50000 UNITS CAPS Take 50,000 Units by mouth 2 (two) times a week. Monday and Thursday     No current facility-administered medications for this visit.      Past Medical History:  Diagnosis Date  . Arthritis    " In my knees   . Atrial fibrillation (HCC) 02/2017  . CAD (coronary artery disease)   . CHF (congestive heart failure) (HCC)   . DM (diabetes mellitus) (HCC)    insulin dependent  . GERD (gastroesophageal reflux disease)   . HLD (hyperlipidemia)   . HTN (hypertension)   . MI (myocardial infarction) (HCC)   . Obesity   . Sleep apnea     Past Surgical History:  Procedure Laterality Date  .  CARDIAC CATHETERIZATION     with coronary angiography   . CARDIOVERSION N/A 05/05/2015   Procedure: CARDIOVERSION;  Surgeon: Vesta Mixer, MD;  Location: [facility] ENDOSCOPY;  Service: Cardiovascular;  Laterality: N/A;  . CARDIOVERSION N/A 02/14/2017   Procedure: CARDIOVERSION;  Surgeon: Vesta Mixer, MD;  Location: [facility] ENDOSCOPY;  Service: Cardiovascular;  Laterality: N/A;  . CORONARY STENT PLACEMENT     eluting stent placement in the culpric lesion of the LAD  . TEE WITHOUT CARDIOVERSION N/A  05/05/2015   Procedure: TRANSESOPHAGEAL ECHOCARDIOGRAM (TEE);  Surgeon: Vesta Mixer, MD;  Location: [facility] ENDOSCOPY;  Service: Cardiovascular;  Laterality: N/A;    Social History   Social History  . Marital status: Married    Spouse name: N/A  . Number of children: N/A  . Years of education: N/A   Occupational History  . Not on file.   Social History Main Topics  . Smoking status: Former Smoker    Packs/day: 3.00    Years: 25.00  . Smokeless tobacco: Never Used     Comment: quit smoking in 2005  . Alcohol use No  . Drug use: No  . Sexual activity: Not on file   Other Topics Concern  . Not on file   Social History Narrative  . No narrative on file    Family History  Problem Relation Age of Onset  . Heart attack Mother   . Heart attack Father     ROS: no fevers or chills, productive cough, hemoptysis, dysphasia, odynophagia, melena, hematochezia, dysuria, hematuria, rash, seizure activity, orthopnea, PND, pedal edema, claudication. Remaining systems are negative.  Physical Exam: Well-developed well-nourished in no acute distress.  Skin is warm and dry.  HEENT is normal.  Neck is supple.  Chest is clear to auscultation with normal expansion.  Cardiovascular exam is regular rate and rhythm.  Abdominal exam nontender or distended. No masses palpated. Extremities show no edema. neuro grossly intact  ECG- personally reviewed  A/P  1  Olga Millers, MD

## 2017-04-23 ENCOUNTER — Telehealth (HOSPITAL_COMMUNITY): Payer: Self-pay | Admitting: *Deleted

## 2017-04-23 NOTE — Telephone Encounter (Signed)
Pt spouse called reporting an upset stomach due to the magnesium he is taking twice a day.  He stated he is now having diarrhea.  Pt was instructed to cut down to once a day and give it 5 days.  If this does not improve, we will know that the magnesium is not causing this.  He should resume 2 a day if this is not the cause.  Pt understood and will call back in a week to let us know

## 2017-04-28 ENCOUNTER — Encounter: Payer: Self-pay | Admitting: Pulmonary Disease

## 2017-04-28 ENCOUNTER — Ambulatory Visit (INDEPENDENT_AMBULATORY_CARE_PROVIDER_SITE_OTHER): Payer: Medicare Other | Admitting: Pulmonary Disease

## 2017-04-28 DIAGNOSIS — Z23 Encounter for immunization: Secondary | ICD-10-CM

## 2017-04-28 DIAGNOSIS — R918 Other nonspecific abnormal finding of lung field: Secondary | ICD-10-CM

## 2017-04-28 MED ORDER — FLUTICASONE FUROATE-VILANTEROL 200-25 MCG/INH IN AEPB: 1.0000 | INHALATION_SPRAY | Freq: Every day | RESPIRATORY_TRACT | 5 refills | Status: DC

## 2017-04-28 MED ORDER — FLUTICASONE FUROATE-VILANTEROL 200-25 MCG/INH IN AEPB: 1.0000 | INHALATION_SPRAY | Freq: Every day | RESPIRATORY_TRACT | 0 refills | Status: DC

## 2017-04-28 NOTE — Patient Instructions (Addendum)
Your oxygen levels did not go down in office today. It's okay to stop the oxygen during the daytime. However continue to use it during the night We'll stop the Symbicort and start you on breo inhaler I have reviewed your CT scan and pulmonary function tests Your test results showed that her shortness of breath is from a combination of COPD and heart disease There are small pulmonary nodules that we will follow with a CT in 1 year's time.  Follow-up in 6 months.

## 2017-04-28 NOTE — Progress Notes (Signed)
Lawrence Delgado    [id number]    03-Mar-1956  Primary Care Physician:Butler, Aram Beecham, DO  Referring Physician: Samuel Jester, DO [address] [place], [place] [address]  Chief complaint:   Follow up for dyspnea Emphysema  HPI: 61 year old with significant coronary history of MI, stent to LAD, right coronary artery, atrial fibrillation cardiomyopathy (EF 20%] . He has been referred for evaluation of chronic dyspnea on exertion, wheezing. He does not have dyspnea at rest. He denies any cough, sputum production, chest pain, palpitations, fevers, chills.  He was hospitalized in October 2015 for new onset A. fib and has been on 4 L oxygen 24/7 since then. He would like to get off the oxygen. There is also question of sleep apnea, obesity hypoventilation syndrome. He feels that he does not have any problems with sleep.   He has about 90-pack-year smoking history. He quit in 2005. He is currently disabled with no known exposures.  Interim History: He was hospitalized in August 2018 for atrial fibrillation with RVR. He was initially started on Tikosyn. This was stopped and put on amiodarone. He follows up at the A. fib clinic. He was on Breo during this hospitalization and feels that this works better for him than the Symbicort. He has chronic dyspnea on exertion. Denies any cough, sputum production.  Outpatient Encounter Prescriptions as of 04/28/2017  Medication Sig  . amiodarone (PACERONE) 200 MG tablet Take 1 tablet (200 mg total) by mouth daily.  Marland Kitchen apixaban (ELIQUIS) 5 MG TABS tablet Take 1 tablet (5 mg total) by mouth 2 (two) times daily.  . budesonide-formoterol (SYMBICORT) 80-4.5 MCG/ACT inhaler Inhale 2 puffs into the lungs 2 (two) times daily.  . furosemide (LASIX) 40 MG tablet TAKE 1 TABLET DAILY (Patient taking differently: TAKE 1 TABLET ( ) BY MOUTH DAILY)  . insulin lispro (HUMALOG) 100 UNIT/ML injection Inject 7-17 Units into the skin 3 (three) times daily before  meals. Sliding scale  . insulin lispro protamine-lispro (HUMALOG 75/25 MIX) (75-25) 100 UNIT/ML SUSP injection Inject 50 Units into the skin 2 (two) times daily with a meal.   . irbesartan (AVAPRO) 150 MG tablet TAKE 1 TABLET DAILY (Patient taking differently: TAKE 1 TABLET ( ) BY MOUTH DAILY)  . magnesium oxide (MAG-OX) 400 (241.3 Mg) MG tablet TAKE 1 TABLET DAILY  . metFORMIN (GLUCOPHAGE) 500 MG tablet Take 500 mg by mouth 2 (two) times daily with a meal.   . metoprolol succinate (TOPROL-XL) 50 MG 24 hr tablet Take 3 tablets (150 mg total) by mouth daily.  . nitroGLYCERIN (NITROSTAT) 0.4 MG SL tablet Place 1 tablet (0.4 mg total) under the tongue every 5 (five) minutes as needed.  . rosuvastatin (CRESTOR) 40 MG tablet TAKE 1 TABLET DAILY (Patient taking differently: TAKE 1 TABLET ( ) BY MOUTH DAILY)  . spironolactone (ALDACTONE) 25 MG tablet TAKE 1 TABLET DAILY  . Vitamin D, Ergocalciferol, (DRISDOL) 50000 UNITS CAPS Take 50,000 Units by mouth 2 (two) times a week. Monday and Thursday   No facility-administered encounter medications on file as of 04/28/2017.     Allergies as of 04/28/2017 - Review Complete 04/28/2017  Allergen Reaction Noted  . Codeine    . Clopidogrel bisulfate Itching and Rash     Past Medical History:  Diagnosis Date  . Arthritis    " In my knees   . Atrial fibrillation (HCC) 02/2017  . CAD (coronary artery disease)   . CHF (congestive heart failure) (HCC)   .  DM (diabetes mellitus) (HCC)    insulin dependent  . GERD (gastroesophageal reflux disease)   . HLD (hyperlipidemia)   . HTN (hypertension)   . MI (myocardial infarction) (HCC)   . Obesity   . Sleep apnea     Past Surgical History:  Procedure Laterality Date  . CARDIAC CATHETERIZATION     with coronary angiography   . CARDIOVERSION N/A 05/05/2015   Procedure: CARDIOVERSION;  Surgeon: Vesta Mixer, MD;  Location: [facility] ENDOSCOPY;  Service: Cardiovascular;  Laterality: N/A;  . CARDIOVERSION  N/A 02/14/2017   Procedure: CARDIOVERSION;  Surgeon: Vesta Mixer, MD;  Location: [facility] ENDOSCOPY;  Service: Cardiovascular;  Laterality: N/A;  . CORONARY STENT PLACEMENT     eluting stent placement in the culpric lesion of the LAD  . TEE WITHOUT CARDIOVERSION N/A 05/05/2015   Procedure: TRANSESOPHAGEAL ECHOCARDIOGRAM (TEE);  Surgeon: Vesta Mixer, MD;  Location: [facility] ENDOSCOPY;  Service: Cardiovascular;  Laterality: N/A;    Family History  Problem Relation Age of Onset  . Heart attack Mother   . Heart attack Father     Social History   Social History  . Marital status: Married    Spouse name: N/A  . Number of children: N/A  . Years of education: N/A   Occupational History  . Not on file.   Social History Main Topics  . Smoking status: Former Smoker    Packs/day: 3.00    Years: 25.00    Quit date: 2005  . Smokeless tobacco: Never Used     Comment: quit smoking in 2005  . Alcohol use No  . Drug use: No  . Sexual activity: Not on file   Other Topics Concern  . Not on file   Social History Narrative  . No narrative on file    Review of systems: Review of Systems  Constitutional: Negative for fever and chills.  HENT: Negative.   Eyes: Negative for blurred vision.  Respiratory: as per HPI  Cardiovascular: Negative for chest pain and palpitations.  Gastrointestinal: Negative for vomiting, diarrhea, blood per rectum. Genitourinary: Negative for dysuria, urgency, frequency and hematuria.  Musculoskeletal: Negative for myalgias, back pain and joint pain.  Skin: Negative for itching and rash.  Neurological: Negative for dizziness, tremors, focal weakness, seizures and loss of consciousness.  Endo/Heme/Allergies: Negative for environmental allergies.  Psychiatric/Behavioral: Negative for depression, suicidal ideas and hallucinations.  All other systems reviewed and are negative.  Physical Exam: Blood pressure 136/74, pulse 68, height  (1.854 m), weight 128.6 kg  (283 lb 9.6 oz), SpO2 90 %. Gen:      No acute distress HEENT:  EOMI, sclera anicteric Neck:     No masses; no thyromegaly Lungs:    Clear to auscultation bilaterally; normal respiratory effort CV:         Regular rate and rhythm; no murmurs Abd:      + bowel sounds; soft, non-tender; no palpable masses, no distension Ext:    No edema; adequate peripheral perfusion Skin:      Warm and dry; no rash Neuro: alert and oriented x 3 Psych: normal mood and affect  Data Reviewed: Chest x-ray 05/05/15- Cardiomegaly with pulmonary edema, small bilateral effusions Chest x-ray 05/07/15-cardiomegaly with improvement in pulmonary edema. No pleural effusions High-resolution CT 04/03/17-no interstitial lung disease, moderate emphysema, CHF Subcentimeter pulmonary nodules I have reviewed all images personally.  Echo 05/03/15- Technically difficult study - definity contrast given. There is   severe global hypokinesis, LVEF 20%. This is  a marked reduction   in function compared to the prior study in 2015.  PFTs 02/05/17 FVC 2.19 [42%], FEV1 1.1 [46%], F/F 83, TLC 56%, DLCO 51% Severe restriction and diffusion defect.  Assessment:  COPD His CT and PFTs were reviewed. There are emphysematous changes on the CT however PFTs do not show significant obstruction.. There is evidence of CHF which may be contributing to the symptoms of dyspnea, restriction and diffusion defect. There is no evidence of interstitial lung disease  He continues on the Symbicort but feels that breo works better for him and wants to switch. He is on chronic oxygen and would like to get off. He did not desat in office on exercise. I told him it's okay to stop supplemental oxygen during the daytime. He'll continue the supplemental oxygen at night. He'll need to be reassessed at next visit.  Suspected OSA/OHA Home sleep study ordered but patient canceled. He does not want to get reevaluated  Subcentimeter pulmonary nodules Follow-up  with repeat CT in 1 year.  Plan/Recommendations: - Continue home O2 at night. Stop O2 at rest and with exertion - Change symbicort to breo - Flu shot - Follow up CT in 1 year  Chilton Greathouse MD [facility] Pager [phone] 04/28/2017, 10:37 AM  CC: Samuel Jester, DO

## 2017-04-30 ENCOUNTER — Ambulatory Visit: Payer: Medicare Other | Admitting: Cardiology

## 2017-05-14 ENCOUNTER — Telehealth: Payer: Self-pay | Admitting: Cardiology

## 2017-05-14 NOTE — Telephone Encounter (Signed)
Continue present meds and follow HR; take additional 40 mg lasix for worsening edema or weight gain of 2-3 lbs; fu as scheduled  Lawrence MillersBrian Emmalou Hunger

## 2017-05-14 NOTE — Telephone Encounter (Signed)
New message   Patient states he may need medication increased. Please call  Pt c/o medication issue:  1. Name of Medication: spironolactone (ALDACTONE) 25 MG tablet  2. How are you currently taking this medication (dosage and times per day)? Pt taking half a pill  3. Are you having a reaction (difficulty breathing--STAT)? NO  4. What is your medication issue?  Patient thinks medication needs to be adjusted.   Patient c/o Palpitations:  High priority if patient c/o lightheadedness, shortness of breath, or chest pain  1) How long have you had palpitations/irregular HR/ Afib? Are you having the symptoms now?  No  2) Are you currently experiencing lightheadedness, SOB or CP? Lightheaded sometimes  3) Do you have a history of afib (atrial fibrillation) or irregular heart rhythm? Yes- afib  4) Have you checked your BP or HR? (document readings if available): HR in the 40's  5) Are you experiencing any other symptoms? "wheezing a little"

## 2017-05-14 NOTE — Telephone Encounter (Signed)
Pt of Dr. Jens Somrenshaw Also seen by Dr. Johney FrameAllred and A Fib clinic  Hx A Fib w RVR  A Fib:  Identifies no specific acute concerns. States he's had some problems for the past few weeks he's noticed. Mainly concerned regarding an uptick in A Fib w rapid rate. He's had episodes where his HR goes into the 150s or 160s.  He was cardioverted in ER in August, followed by Dr. Johney FrameAllred and Rudi Cocoonna Carroll as outpatient.  Pt had expressed aversion to initiating tikosyn so remains on metoprolol and amiodarone.  He did note his HR was in 40s or 50s when checked today, he obtained this reading by a home pulse ox. I explained that these devices often may be inaccurate esp if pt has a rate controlled A Fib, as they don't necessarily "pick up" all the beats.   Foot swelling:  He reports recently his spironolactone was reduced to 1/2 tablet daily. He has continued his furosemide 40mg  daily. He notes swelling of feet at the end of the day, which resolves when he wakes in the morning. Pt notes no new fatigue, dyspnea, obvious weight gain, etc. He states he has "a little wheeze" but no cough, cold - didn't elicit further information.  ------------------------  Pt would like to see if anything recommended as far as med changes (resume prior Spironolactone dose?)  He wanted to check w Dr. Jens Somrenshaw to see if a sooner return OV was recommended.

## 2017-05-15 NOTE — Telephone Encounter (Signed)
Patient returned call. Notified him of MD recommendations. Med list updated. Advised him to notify our office if he is having to use PRN lasix frequently

## 2017-05-15 NOTE — Telephone Encounter (Signed)
Attempted to return call to patient. Phone rang, no answer, no option to leave VM

## 2017-05-26 ENCOUNTER — Telehealth: Payer: Self-pay | Admitting: Cardiology

## 2017-05-26 NOTE — Telephone Encounter (Signed)
New message    Wife calling regarding question on his health.     No  Chest pain  No sob.

## 2017-05-26 NOTE — Telephone Encounter (Signed)
Returned call to patient he stated for the past 3 to 4 days right calf feels tight at times.No swelling or redness.Spoke to DOD Dr.Croitoru he advised to see PCP.

## 2017-05-30 ENCOUNTER — Other Ambulatory Visit (HOSPITAL_COMMUNITY): Payer: Self-pay | Admitting: Cardiology

## 2017-05-30 NOTE — Telephone Encounter (Signed)
REFILL 

## 2017-06-02 ENCOUNTER — Other Ambulatory Visit (HOSPITAL_COMMUNITY): Payer: Self-pay | Admitting: Cardiology

## 2017-06-27 ENCOUNTER — Telehealth (HOSPITAL_COMMUNITY): Payer: Self-pay | Admitting: *Deleted

## 2017-06-27 NOTE — Telephone Encounter (Signed)
Pt called stating he has diarrhea every day that he takes magnesium. He would like to stop magnesium supplement. This was originally started when pt was loaded on tikosyn (taken off due to QT). Instructed pt to stop magnesium but if diarrhea persists he should follow up with PCP.

## 2017-08-13 NOTE — Progress Notes (Deleted)
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 Admitted with new onset atrial fibrillation 10/16. TSH normal. Transesophageal echocardiogram October 2016 showed ejection fraction 35-40% and mild mitral regurgitation. Nuclear study 11/16 showed EF 43, prior anterior apical infarct and no ischemia. Echocardiogram repeated August 2018. Ejection fraction 35-40%, grade 2 diastolic dysfunction. Chest CT September 2018 showed probable edema. There was emphysema noted and small pulmonary nodules and follow-up recommended in one year. There was also a possible left thyroid nodule and thyroid ultrasound recommended. Patient was hospitalized for tikosyn for atrial fibrillation but QT prolonged and he was placed on amiodarone by Dr Elberta Fortisamnitz. Since last seen,   Current Outpatient Medications  Medication Sig Dispense Refill  . amiodarone (PACERONE) 200 MG tablet Take 1 tablet (200 mg total) by mouth daily. 60 tablet 3  . apixaban (ELIQUIS) 5 MG TABS tablet Take 1 tablet (5 mg total) by mouth 2 (two) times daily. 180 tablet 3  . fluticasone furoate-vilanterol (BREO ELLIPTA) 200-25 MCG/INH AEPB Inhale 1 puff into the lungs daily. 1 each 5  . fluticasone furoate-vilanterol (BREO ELLIPTA) 200-25 MCG/INH AEPB Inhale 1 puff into the lungs daily. 1 each 0  . furosemide (LASIX) 40 MG tablet Take 40 mg by mouth daily. Can take additional 40mg  as needed for weight gain of 2-3lbs    . insulin lispro (HUMALOG) 100 UNIT/ML injection Inject 7-17 Units into the skin 3 (three) times daily before meals. Sliding scale    . insulin lispro protamine-lispro (HUMALOG 75/25 MIX) (75-25) 100 UNIT/ML SUSP injection Inject 50 Units into the skin 2 (two) times daily with a meal.     . irbesartan (AVAPRO) 150 MG tablet TAKE 1 TABLET DAILY (Patient taking differently: TAKE 1 TABLET (150mg ) BY MOUTH DAILY) 90 tablet 3  . metFORMIN (GLUCOPHAGE) 500 MG tablet  Take 500 mg by mouth 2 (two) times daily with a meal.     . metoprolol succinate (TOPROL-XL) 50 MG 24 hr tablet Take 3 tablets (150 mg total) by mouth daily. 270 tablet 3  . nitroGLYCERIN (NITROSTAT) 0.4 MG SL tablet Place 1 tablet (0.4 mg total) under the tongue every 5 (five) minutes as needed. 25 tablet 5  . rosuvastatin (CRESTOR) 40 MG tablet TAKE 1 TABLET DAILY (Patient taking differently: TAKE 1 TABLET (40mg ) BY MOUTH DAILY) 90 tablet 3  . spironolactone (ALDACTONE) 25 MG tablet TAKE 1 TABLET DAILY 90 tablet 1  . Vitamin D, Ergocalciferol, (DRISDOL) 50000 UNITS CAPS Take 50,000 Units by mouth 2 (two) times a week. Monday and Thursday     No current facility-administered medications for this visit.      Past Medical History:  Diagnosis Date  . Arthritis    " In my knees   . Atrial fibrillation (HCC) 02/2017  . CAD (coronary artery disease)   . CHF (congestive heart failure) (HCC)   . DM (diabetes mellitus) (HCC)    insulin dependent  . GERD (gastroesophageal reflux disease)   . HLD (hyperlipidemia)   . HTN (hypertension)   . MI (myocardial infarction) (HCC)   . Obesity   . Sleep apnea     Past Surgical History:  Procedure Laterality Date  . CARDIAC CATHETERIZATION     with coronary angiography   . CARDIOVERSION N/A 05/05/2015   Procedure: CARDIOVERSION;  Surgeon: Vesta MixerPhilip J Nahser, MD;  [facility];  Service: Cardiovascular;  Laterality: N/A;  . CARDIOVERSION  N/A 02/14/2017   Procedure: CARDIOVERSION;  Surgeon: Elease Hashimoto, Deloris Ping, MD;  Location: [facility] ENDOSCOPY;  Service: Cardiovascular;  Laterality: N/A;  . CORONARY STENT PLACEMENT     eluting stent placement in the culpric lesion of the LAD  . TEE WITHOUT CARDIOVERSION N/A 05/05/2015   Procedure: TRANSESOPHAGEAL ECHOCARDIOGRAM (TEE);  Surgeon: Vesta Mixer, MD;  Location: [facility] ENDOSCOPY;  Service: Cardiovascular;  Laterality: N/A;    Social History   Socioeconomic History  . Marital status: Married    Spouse  name: Not on file  . Number of children: Not on file  . Years of education: Not on file  . Highest education level: Not on file  Social Needs  . Financial resource strain: Not on file  . Food insecurity - worry: Not on file  . Food insecurity - inability: Not on file  . Transportation needs - medical: Not on file  . Transportation needs - non-medical: Not on file  Occupational History  . Not on file  Tobacco Use  . Smoking status: Former Smoker    Packs/day: 3.00    Years: 25.00    Pack years: 75.00    Last attempt to quit: 2005    Years since quitting: 14.0  . Smokeless tobacco: Never Used  . Tobacco comment: quit smoking in 2005  Substance and Sexual Activity  . Alcohol use: No  . Drug use: No  . Sexual activity: Not on file  Other Topics Concern  . Not on file  Social History Narrative  . Not on file    Family History  Problem Relation Age of Onset  . Heart attack Mother   . Heart attack Father     ROS: no fevers or chills, productive cough, hemoptysis, dysphasia, odynophagia, melena, hematochezia, dysuria, hematuria, rash, seizure activity, orthopnea, PND, pedal edema, claudication. Remaining systems are negative.  Physical Exam: Well-developed well-nourished in no acute distress.  Skin is warm and dry.  HEENT is normal.  Neck is supple.  Chest is clear to auscultation with normal expansion.  Cardiovascular exam is regular rate and rhythm.  Abdominal exam nontender or distended. No masses palpated. Extremities show no edema. neuro grossly intact  ECG- personally reviewed  A/P  1  Lawrence Millers, MD

## 2017-08-15 ENCOUNTER — Other Ambulatory Visit (HOSPITAL_COMMUNITY): Payer: Self-pay | Admitting: *Deleted

## 2017-08-15 MED ORDER — AMIODARONE HCL 200 MG PO TABS: 200.0000 mg | ORAL_TABLET | Freq: Every day | ORAL | 3 refills | Status: DC

## 2017-08-15 MED ORDER — AMIODARONE HCL 200 MG PO TABS: 200.0000 mg | ORAL_TABLET | Freq: Every day | ORAL | 3 refills | Status: AC

## 2017-08-20 ENCOUNTER — Ambulatory Visit: Payer: Medicare Other | Admitting: Cardiology

## 2017-08-27 ENCOUNTER — Other Ambulatory Visit (HOSPITAL_COMMUNITY): Payer: Self-pay | Admitting: Nurse Practitioner

## 2017-09-03 ENCOUNTER — Telehealth: Payer: Self-pay | Admitting: Cardiology

## 2017-09-03 NOTE — Telephone Encounter (Signed)
Call placed to the patient. He stated that the swelling does go down over night but that he has to adjust his pillows at night to help with the shortness of breath. He refused an appointment with a PA stating that [place] could only be [place] and by Dr. Jens Somrenshaw. The patient would like to know if he may increase his lasix.He does have an appointment with his PCP tomorrow.

## 2017-09-03 NOTE — Telephone Encounter (Signed)
[name]  

## 2017-09-03 NOTE — Telephone Encounter (Signed)
Returned call to patient.He stated he has been sob,increased swelling in both ankles.Stated he has gained 5 to 8 lbs within the past 2 weeks.Stated he has [place] [facility] [place] [place] [place] [facility],wants to be seen sooner there.Advised [facility].

## 2017-09-03 NOTE — Telephone Encounter (Signed)
New message  Patient calling to ask if furosemide (LASIX) 40 MG tablet needs to be increased. Please call  Pt c/o swelling: STAT is pt has developed SOB within 24 hours  1) How much weight have you gained and in what time span? n/a  2) If swelling, where is the swelling located? Left leg  3) Are you currently taking a fluid pill? yes  4) Are you currently SOB? Yes, when laying down  5) Do you have a log of your daily weights (if so, list)? 285 today, 277 a week ago  6) Have you gained 3 pounds in a day or 5 pounds in a week? n/a  7) Have you traveled recently? no

## 2017-09-03 NOTE — Telephone Encounter (Signed)
Follow up   Patient is calling back about his SOB. Please call.

## 2017-09-03 NOTE — Telephone Encounter (Signed)
Spoke with pt, he reports his weight is up 5-8 lbs in 2 weeks. [place] He has a follow up 11-26-17. He was instructed to take an extra furosemide now. He was instructed to take 80 mg of furosemide Thursday and Friday. He has an appointment with his medical doctor Tuesday next week and will make sure to get lab work checked at that appointment.

## 2017-09-26 DEATH — deceased

## 2017-10-08 ENCOUNTER — Ambulatory Visit: Payer: Medicare Other | Admitting: Cardiology

## 2017-10-24 ENCOUNTER — Ambulatory Visit: Payer: Medicare Other | Admitting: Pulmonary Disease

## 2017-11-06 ENCOUNTER — Ambulatory Visit: Payer: Medicare Other | Admitting: Pulmonary Disease

## 2017-11-21 NOTE — Progress Notes (Signed)
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 Nuclear study 11/16 showed EF 43, prior anterior apical infarct and no ischemia.  Last echocardiogram August 2018 showed ejection fraction 35 to 40%, grade 2 diastolic dysfunction and mild tricuspid regurgitation.  Chest CT September 2018 showed CHF, emphysema, coronary atherosclerosis, thyroid nodule and pulmonary nodules.  Thyroid ultrasound and follow-up chest CT suggested.  Lung disease followed by pulmonary.  Pt also with PAF.  Patient tried on Tikosyn but QT lengthened.  He was therefore placed on amiodarone.  Since last seen,  patient denies dyspnea, chest pain, palpitations or syncope.  Current Outpatient Medications  Medication Sig Dispense Refill  . amiodarone (PACERONE) 200 MG tablet Take 1 tablet (200 mg total) by mouth daily. 90 tablet 3  . apixaban (ELIQUIS) 5 MG TABS tablet Take 1 tablet (5 mg total) by mouth 2 (two) times daily. 180 tablet 3  . furosemide (LASIX) 40 MG tablet Take 40 mg by mouth daily. Can take additional 40mg  as needed for weight gain of 2-3lbs    . insulin lispro (HUMALOG) 100 UNIT/ML injection Inject 7-17 Units into the skin 3 (three) times daily before meals. Sliding scale    . insulin lispro protamine-lispro (HUMALOG 75/25 MIX) (75-25) 100 UNIT/ML SUSP injection Inject 50 Units into the skin 2 (two) times daily with a meal.     . irbesartan (AVAPRO) 150 MG tablet TAKE 1 TABLET DAILY (Patient taking differently: TAKE 1 TABLET (150mg ) BY MOUTH DAILY) 90 tablet 3  . metFORMIN (GLUCOPHAGE) 500 MG tablet Take 500 mg by mouth 2 (two) times daily with a meal.     . metoprolol succinate (TOPROL-XL) 50 MG 24 hr tablet Take 3 tablets (150 mg total) by mouth daily. 270 tablet 3  . rosuvastatin (CRESTOR) 40 MG tablet TAKE 1 TABLET DAILY (Patient taking differently: TAKE 1 TABLET (40mg ) BY MOUTH DAILY) 90 tablet 3  . spironolactone  (ALDACTONE) 25 MG tablet TAKE 1 TABLET DAILY 90 tablet 1  . Vitamin D, Ergocalciferol, (DRISDOL) 50000 UNITS CAPS Take 50,000 Units by mouth 2 (two) times a week. Monday and Thursday    . nitroGLYCERIN (NITROSTAT) 0.4 MG SL tablet Place 1 tablet (0.4 mg total) under the tongue every 5 (five) minutes as needed. 25 tablet 5   No current facility-administered medications for this visit.      Past Medical History:  Diagnosis Date  . Arthritis    " In my knees   . Atrial fibrillation (HCC) 02/2017  . CAD (coronary artery disease)   . CHF (congestive heart failure) (HCC)   . DM (diabetes mellitus) (HCC)    insulin dependent  . GERD (gastroesophageal reflux disease)   . HLD (hyperlipidemia)   . HTN (hypertension)   . MI (myocardial infarction) (HCC)   . Obesity   . Sleep apnea     Past Surgical History:  Procedure Laterality Date  . CARDIAC CATHETERIZATION     with coronary angiography   . CARDIOVERSION N/A 05/05/2015   Procedure: CARDIOVERSION;  Surgeon: Vesta Mixer, MD;  Location: [facility] ENDOSCOPY;  Service: Cardiovascular;  Laterality: N/A;  . CARDIOVERSION N/A 02/14/2017   Procedure: CARDIOVERSION;  Surgeon: Vesta Mixer, MD;  Location: [facility] ENDOSCOPY;  Service: Cardiovascular;  Laterality: N/A;  . CORONARY STENT PLACEMENT     eluting stent placement in the culpric lesion of the LAD  . TEE WITHOUT CARDIOVERSION  N/A 05/05/2015   Procedure: TRANSESOPHAGEAL ECHOCARDIOGRAM (TEE);  Surgeon: Vesta Mixer, MD;  Location: [facility] ENDOSCOPY;  Service: Cardiovascular;  Laterality: N/A;    Social History   Socioeconomic History  . Marital status: Married    Spouse name: Not on file  . Number of children: Not on file  . Years of education: Not on file  . Highest education level: Not on file  Occupational History  . Not on file  Social Needs  . Financial resource strain: Not on file  . Food insecurity:    Worry: Not on file    Inability: Not on file  . Transportation needs:     Medical: Not on file    Non-medical: Not on file  Tobacco Use  . Smoking status: Former Smoker    Packs/day: 3.00    Years: 25.00    Pack years: 75.00    Last attempt to quit: 2005    Years since quitting: 14.3  . Smokeless tobacco: Never Used  . Tobacco comment: quit smoking in 2005  Substance and Sexual Activity  . Alcohol use: No  . Drug use: No  . Sexual activity: Not on file  Lifestyle  . Physical activity:    Days per week: Not on file    Minutes per session: Not on file  . Stress: Not on file  Relationships  . Social connections:    Talks on phone: Not on file    Gets together: Not on file    Attends religious service: Not on file    Active member of club or organization: Not on file    Attends meetings of clubs or organizations: Not on file    Relationship status: Not on file  . Intimate partner violence:    Fear of current or ex partner: Not on file    Emotionally abused: Not on file    Physically abused: Not on file    Forced sexual activity: Not on file  Other Topics Concern  . Not on file  Social History Narrative  . Not on file    Family History  Problem Relation Age of Onset  . Heart attack Mother   . Heart attack Father     ROS: no fevers or chills, productive cough, hemoptysis, dysphasia, odynophagia, melena, hematochezia, dysuria, hematuria, rash, seizure activity, orthopnea, PND, pedal edema, claudication. Remaining systems are negative.  Physical Exam: Well-developed obese in no acute distress.  Skin is warm and dry.  HEENT is normal.  Neck is supple.  Chest is clear to auscultation with normal expansion.  Cardiovascular exam is regular rate and rhythm.  Abdominal exam nontender or distended. No masses palpated. Extremities show trace edema. neuro grossly intact  ECG-sinus bradycardia, septal infarct, no ST changes.  Personally reviewed  A/P  1 paroxysmal atrial fibrillation-patient remains in sinus rhythm.  His QT prolonged with  Tikosyn.  Continue amiodarone.  Check TSH, liver functions and chest x-ray.  Continue Toprol at present dose.  Continue apixaban.  Check hemoglobin and renal function.  2 coronary artery disease-patient is not having chest pain.  Continue medical therapy with statin.  No aspirin given need for apixaban.  3 hypertension-blood pressure is controlled.  Continue present medications.  4 hyperlipidemia-continue statin.  5 ischemic cardiomyopathy-continue beta-blocker.  Discontinue ARB.  Begin Entresto 24/26 twice daily.  Decrease Spironolactone to 12.5 mg daily.  Check potassium and renal function in 1 week.  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  We will continue.  We discussed low-sodium diet and fluid restriction.  7 question thyroid nodule on previous CT scan-thyroid ultrasound recommended and we will arrange.  Lawrence MillersBrian Crenshaw, MD

## 2017-11-26 ENCOUNTER — Encounter: Payer: Self-pay | Admitting: Cardiology

## 2017-11-26 ENCOUNTER — Ambulatory Visit (INDEPENDENT_AMBULATORY_CARE_PROVIDER_SITE_OTHER): Payer: Medicare Other | Admitting: Cardiology

## 2017-11-26 VITALS — BP 140/76 | HR 91 | Ht 73.0 in | Wt 275.8 lb

## 2017-11-26 DIAGNOSIS — I2510 Atherosclerotic heart disease of native coronary artery without angina pectoris: Secondary | ICD-10-CM

## 2017-11-26 DIAGNOSIS — E785 Hyperlipidemia, unspecified: Secondary | ICD-10-CM | POA: Diagnosis not present

## 2017-11-26 DIAGNOSIS — E041 Nontoxic single thyroid nodule: Secondary | ICD-10-CM | POA: Diagnosis not present

## 2017-11-26 DIAGNOSIS — I480 Paroxysmal atrial fibrillation: Secondary | ICD-10-CM | POA: Diagnosis not present

## 2017-11-26 DIAGNOSIS — I10 Essential (primary) hypertension: Secondary | ICD-10-CM | POA: Diagnosis not present

## 2017-11-26 MED ORDER — SPIRONOLACTONE 25 MG PO TABS: 12.5000 mg | ORAL_TABLET | Freq: Every day | ORAL | 1 refills | Status: DC

## 2017-11-26 MED ORDER — SACUBITRIL-VALSARTAN 24-26 MG PO TABS: 1.0000 | ORAL_TABLET | Freq: Two times a day (BID) | ORAL | 6 refills | Status: DC

## 2017-11-26 NOTE — Patient Instructions (Signed)
Medication Instructions:   DECREASE SPIRONOLACTONE TO 12.5 MG ONCE DAILY=1/2 OF THE 25 MG TABLET ONCE DAILY  STOP IRBESARTAN  AFTER 2 DAYS OFF IRBESARTAN START ENTRESTO 24/26 MG ONE TABLET TWICE DAILY  Labwork:  Your physician recommends that you HAVE L;AB WORK TODAY  Testing/Procedures:  A chest x-ray takes a picture of the organs and structures inside the chest, including the heart, lungs, and blood vessels. This test can show several things, including, whether the heart is enlarges; whether fluid is building up in the lungs; and whether pacemaker / defibrillator leads are still in place.   THYROID ULTRASOUND  Follow-Up:  Your physician wants you to follow-up in: 6 MONTHS WITH DR Jens Som You will receive a reminder letter in the mail two months in advance. If you don't receive a letter, please call our office to schedule the follow-up appointment.   If you need a refill on your cardiac medications before your next appointment, please call your pharmacy.

## 2017-11-27 ENCOUNTER — Other Ambulatory Visit: Payer: Self-pay | Admitting: Cardiology

## 2017-11-27 DIAGNOSIS — I2510 Atherosclerotic heart disease of native coronary artery without angina pectoris: Secondary | ICD-10-CM

## 2017-11-27 NOTE — Telephone Encounter (Signed)
REFILL 

## 2017-11-28 ENCOUNTER — Telehealth: Payer: Self-pay | Admitting: Cardiology

## 2017-11-28 NOTE — Telephone Encounter (Signed)
Anthony Sar, RN; Freddi Starr, RN          Jenna/[name]   I rescheduled the ultrasound for this patient to 12-16-17 at 11:30. The patient had a question regarding the chest x-ray and lab work, if he could get it done at [facility]. Can you call him and let him know?   Thanks  [employer] patient and notified him that he can have labs & CXR same day as thyroid US at [facility]

## 2017-11-28 NOTE — Telephone Encounter (Signed)
Routed to [employer] scheduling pool and Programmer, multimedia

## 2017-11-28 NOTE — Telephone Encounter (Signed)
New message    Patient to reschedule US THYROID at [facility]. Patient requesting appointment around 5/20. Please call

## 2017-12-01 ENCOUNTER — Ambulatory Visit (HOSPITAL_COMMUNITY): Admission: RE | Admit: 2017-12-01 | Payer: Medicare Other | Source: Ambulatory Visit

## 2017-12-02 NOTE — Addendum Note (Signed)
Addended by: Freddi Starr on: 12/02/2017 09:17 AM   Modules accepted: Orders

## 2017-12-10 ENCOUNTER — Ambulatory Visit: Payer: Medicare Other | Admitting: Pulmonary Disease

## 2017-12-16 ENCOUNTER — Ambulatory Visit (HOSPITAL_COMMUNITY)
Admission: RE | Admit: 2017-12-16 | Discharge: 2017-12-16 | Disposition: A | Discharge status: Home / Self Care | Payer: Medicare Other | Source: Ambulatory Visit | Attending: Cardiology | Admitting: Cardiology

## 2017-12-16 ENCOUNTER — Other Ambulatory Visit (HOSPITAL_COMMUNITY)
Admission: RE | Admit: 2017-12-16 | Discharge: 2017-12-16 | Disposition: A | Discharge status: Home / Self Care | Payer: Medicare Other | Source: Ambulatory Visit | Attending: Cardiology | Admitting: Cardiology

## 2017-12-16 DIAGNOSIS — I480 Paroxysmal atrial fibrillation: Secondary | ICD-10-CM

## 2017-12-16 DIAGNOSIS — Z79899 Other long term (current) drug therapy: Secondary | ICD-10-CM | POA: Insufficient documentation

## 2017-12-16 DIAGNOSIS — I517 Cardiomegaly: Secondary | ICD-10-CM | POA: Insufficient documentation

## 2017-12-16 DIAGNOSIS — I878 Other specified disorders of veins: Secondary | ICD-10-CM | POA: Diagnosis not present

## 2017-12-16 DIAGNOSIS — E041 Nontoxic single thyroid nodule: Secondary | ICD-10-CM | POA: Diagnosis not present

## 2017-12-16 LAB — CBC
HEMATOCRIT: 40.3 % (ref 39.0–52.0)
HEMOGLOBIN: 13.4 g/dL (ref 13.0–17.0)
MCH: 29.8 pg (ref 26.0–34.0)
MCHC: 33.3 g/dL (ref 30.0–36.0)
MCV: 89.6 fL (ref 78.0–100.0)
Platelets: 202 10*3/uL (ref 150–400)
RBC: 4.5 MIL/uL (ref 4.22–5.81)
RDW: 13 % (ref 11.5–15.5)
WBC: 7.7 10*3/uL (ref 4.0–10.5)

## 2017-12-16 LAB — BASIC METABOLIC PANEL
Anion gap: 10 (ref 5–15)
BUN: 16 mg/dL (ref 6–20)
CO2: 32 mmol/L (ref 22–32)
CREATININE: 1.72 mg/dL — AB (ref 0.61–1.24)
Calcium: 8.7 mg/dL — ABNORMAL LOW (ref 8.9–10.3)
Chloride: 93 mmol/L — ABNORMAL LOW (ref 101–111)
GFR calc Af Amer: 48 mL/min — ABNORMAL LOW (ref >60)
GFR calc non Af Amer: 41 mL/min — ABNORMAL LOW (ref >60)
Glucose, Bld: 344 mg/dL — ABNORMAL HIGH (ref 65–99)
Potassium: 3.5 mmol/L (ref 3.5–5.1)
Sodium: 135 mmol/L (ref 135–145)

## 2017-12-16 LAB — TSH: TSH: 0.642 u[IU]/mL (ref 0.350–4.500)

## 2017-12-17 ENCOUNTER — Telehealth: Payer: Self-pay | Admitting: Cardiology

## 2017-12-17 NOTE — Telephone Encounter (Signed)
Spoke to pt. Gave results of CXR, labs, and Thyroid US. Pt verbalized thanks.

## 2017-12-17 NOTE — Telephone Encounter (Signed)
New message  Patient calling for [place] results

## 2018-01-19 ENCOUNTER — Ambulatory Visit: Payer: Medicare Other | Admitting: Pulmonary Disease

## 2018-02-04 ENCOUNTER — Telehealth: Payer: Self-pay | Admitting: Pulmonary Disease

## 2018-02-04 NOTE — Telephone Encounter (Signed)
Spoke with pt, he would like to know if he can take Breo with Entresto? Dr. Isaiah SergeMannam please advise.    Patient Instructions by Chilton GreathouseMannam, Praveen, MD at 04/28/2017 10:45 AM  Author: Chilton GreathouseMannam, Praveen, MD Author Type: Physician Filed: 04/28/2017 10:58 AM  Note Status: Addendum Cosign: Cosign Not Required Encounter Date: 04/28/2017  Editor: Chilton GreathouseMannam, Praveen, MD (Physician)  Prior Versions: 1. Chilton GreathouseMannam, Praveen, MD (Physician) at 04/28/2017 10:57 AM - Addendum   2. Chilton GreathouseMannam, Praveen, MD (Physician) at 04/28/2017 10:39 AM - Addendum   3. Chilton GreathouseMannam, Praveen, MD (Physician) at 04/28/2017 10:39 AM - Signed    Your oxygen levels did not go down in office today. It's okay to stop the oxygen during the daytime. However continue to use it during the night We'll stop the Symbicort and start you on breo inhaler I have reviewed your CT scan and pulmonary function tests Your test results showed that her shortness of breath is from a combination of COPD and heart disease There are small pulmonary nodules that we will follow with a CT in 1 year's time.  Follow-up in 6 months.

## 2018-02-05 MED ORDER — FLUTICASONE FUROATE-VILANTEROL 200-25 MCG/INH IN AEPB: 1.0000 | INHALATION_SPRAY | Freq: Every day | RESPIRATORY_TRACT | 5 refills | Status: AC

## 2018-02-05 NOTE — Telephone Encounter (Signed)
Attempted to contact pt. I did not receive an answer. There was no option for me to leave a message. Will try back.  

## 2018-02-05 NOTE — Telephone Encounter (Signed)
Spoke with patient. He is aware of Dr. Shirlee MoreMannam's response. Patient asked if we could send in a refill on his Breo 200. He was given a sample during his last OV. [facility]. Advised him I would send this in today. He verbalized understanding. Nothing else needed at time of call.

## 2018-02-05 NOTE — Telephone Encounter (Signed)
Pt is calling back [phone]

## 2018-02-05 NOTE — Telephone Encounter (Signed)
Virgel BouquetBreo is okay to use with Entresto.

## 2018-02-18 ENCOUNTER — Other Ambulatory Visit: Payer: Self-pay | Admitting: Cardiology

## 2018-02-18 NOTE — Telephone Encounter (Signed)
Rx request sent to pharmacy.  

## 2018-02-19 IMAGING — CT CT CHEST HIGH RESOLUTION W/O CM
2 of 5 series · 15 of 36 positions shown, 18 images · non-contrast
Comparison: 09/18/2016 chest radiograph.

CLINICAL DATA: Chronic dyspnea for 2 years requiring oxygen
therapy. Evaluate for interstitial lung disease. History of CHF.

EXAM:
CT CHEST WITHOUT CONTRAST
TECHNIQUE: Multidetector CT imaging of the chest was performed following the
standard protocol without intravenous contrast. High resolution
imaging of the lungs, as well as inspiratory and expiratory imaging,
was performed.

[Series 3: thorax · axial · 0.61mm/px · z∈[+1340,+1594]mm · 12 of 141 slices shown, 15 images]
[im 7/141  mediastinal]
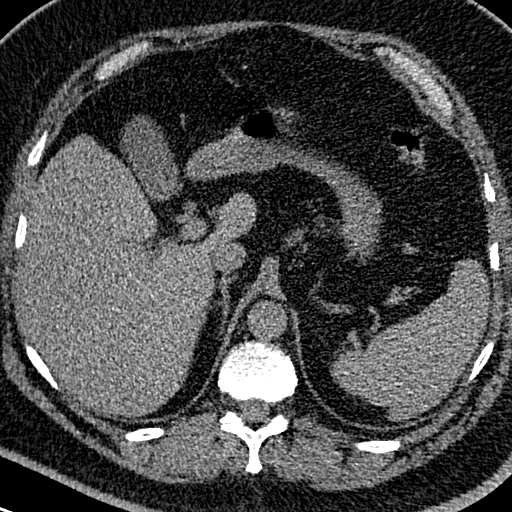
[im 7/141  lung]
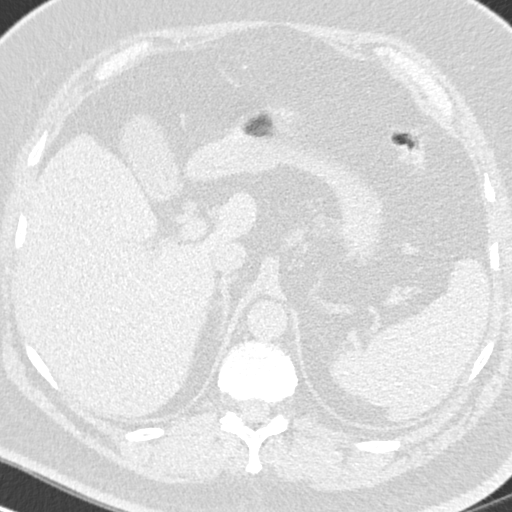
[im 21/141  lung]
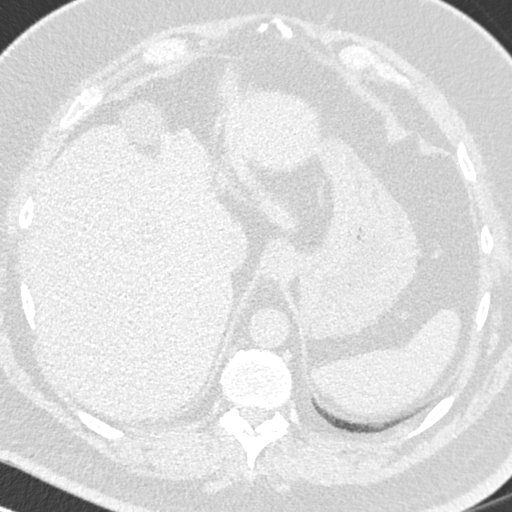
[im 34/141  lung]
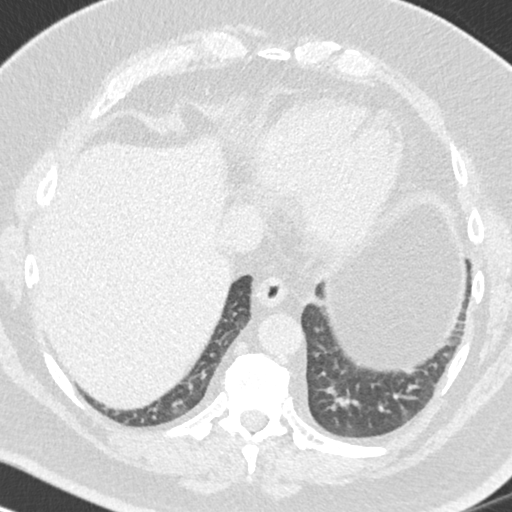
[im 41/141  lung]
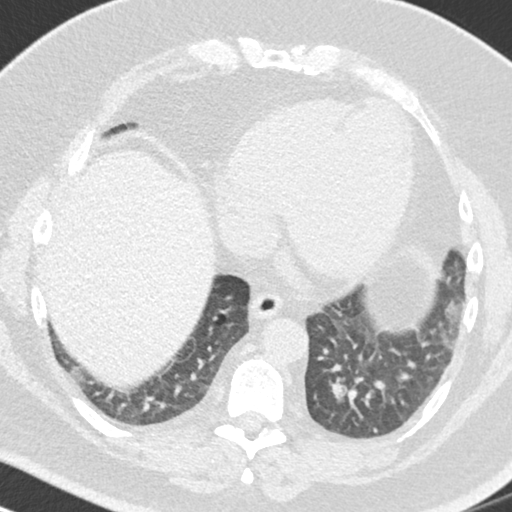
[im 54/141  mediastinal]
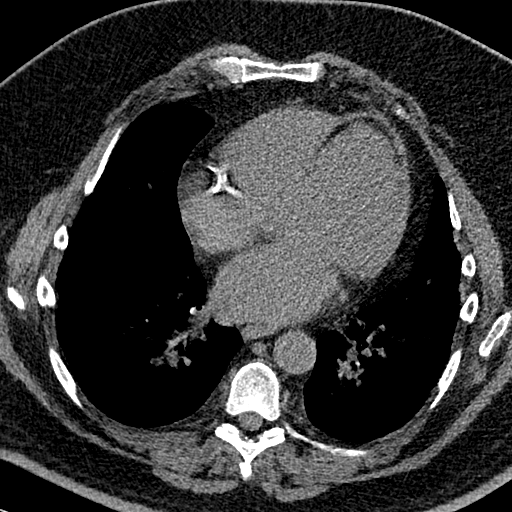
[im 54/141  lung]
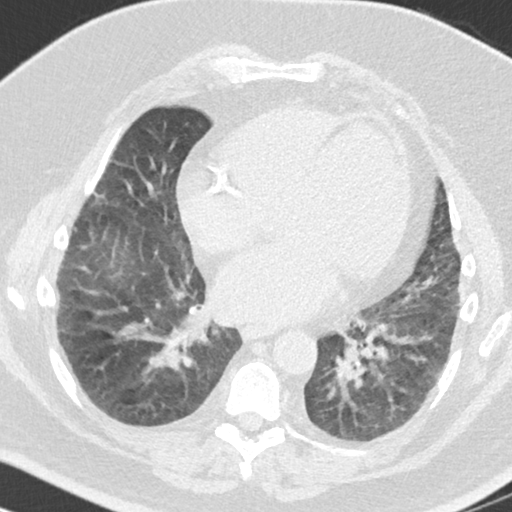
[im 67/141  lung]
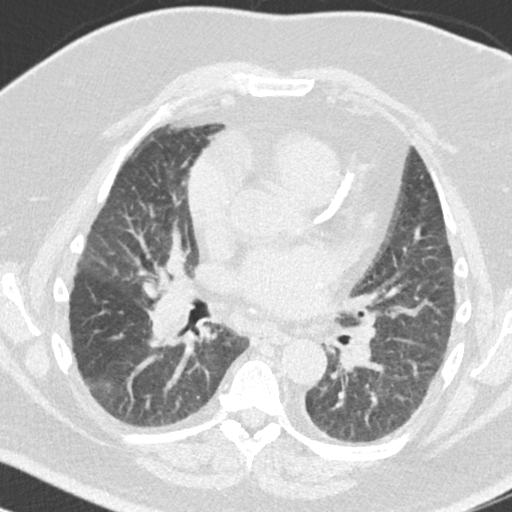
[im 74/141  lung]
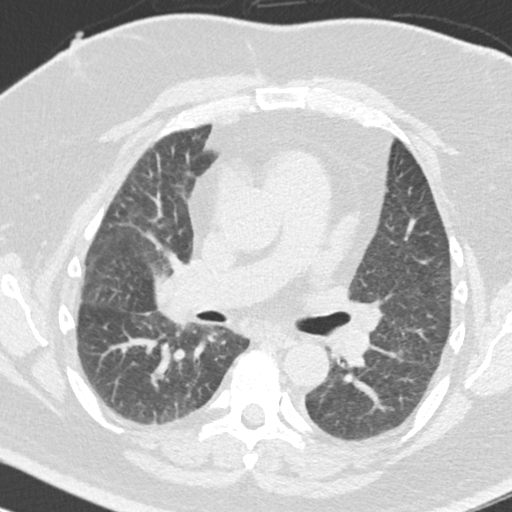
[im 87/141  lung]
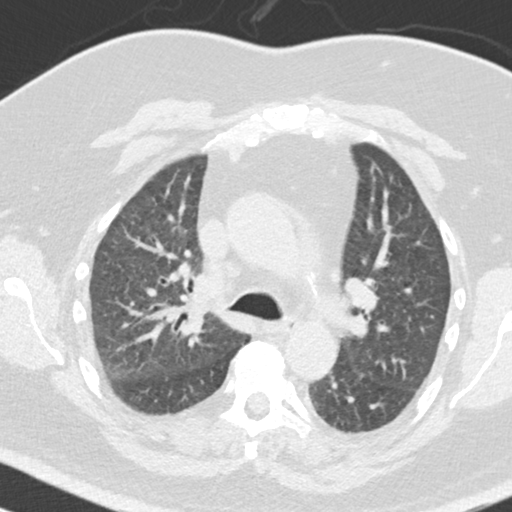
[im 101/141  mediastinal]
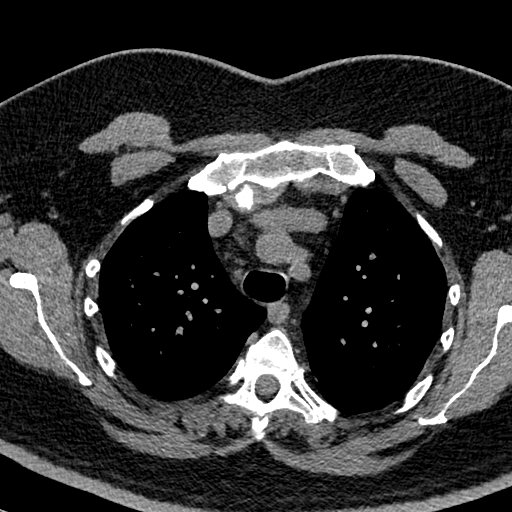
[im 101/141  lung]
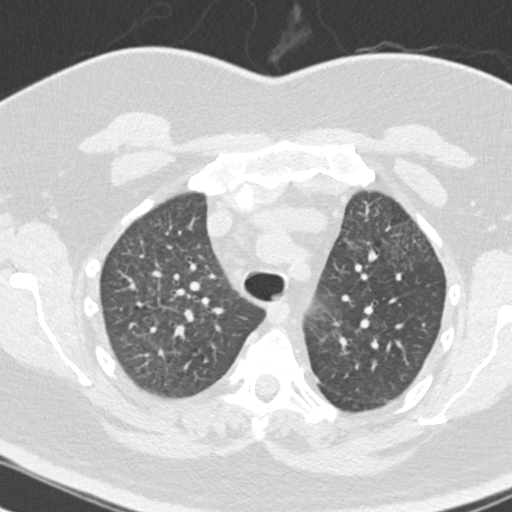
[im 107/141  lung]
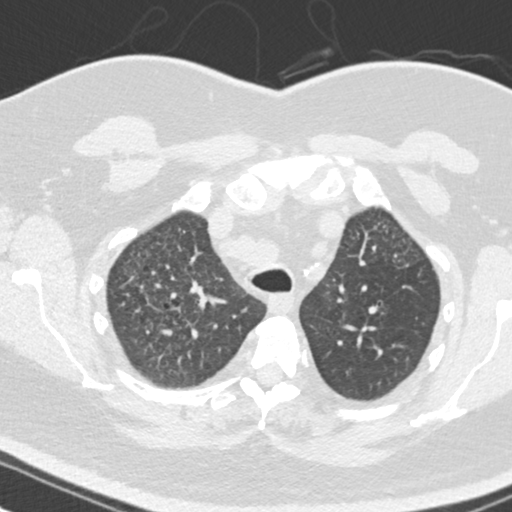
[im 121/141  lung]
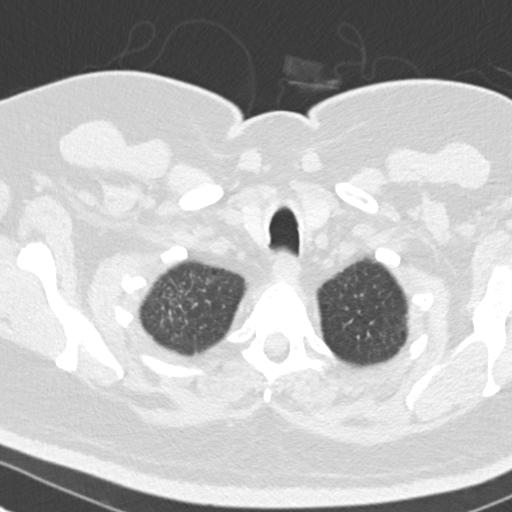
[im 134/141  lung]
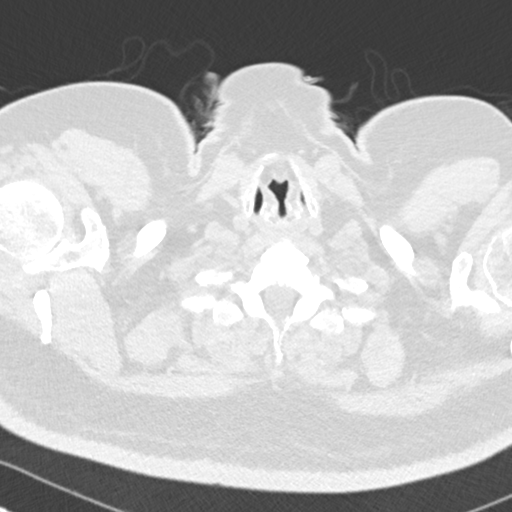

[Series 9: coronal · coronal · 0.58mm/px · 3 of 101 slices shown]
[im 21/101  lung]
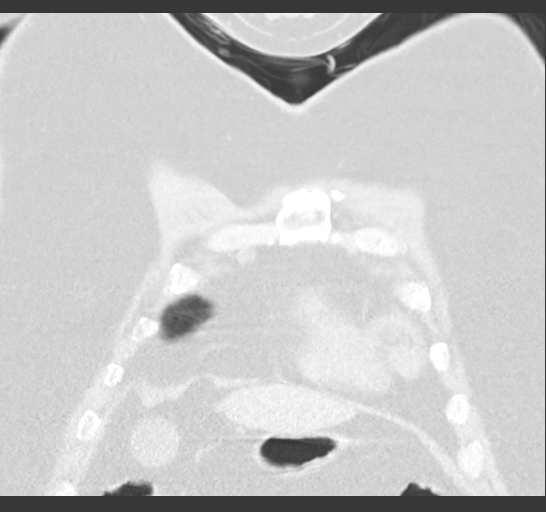
[im 41/101  lung]
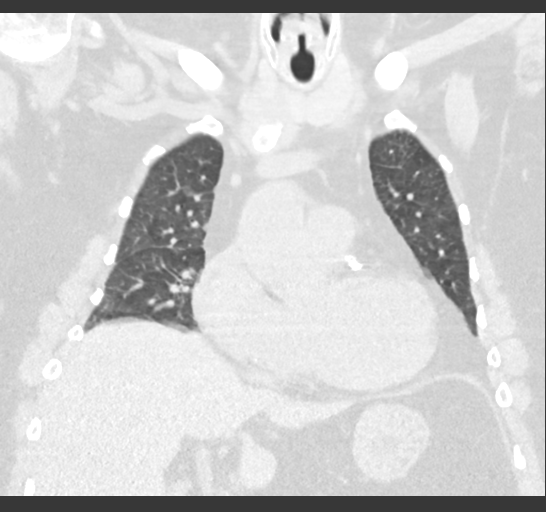
[im 61/101  lung]
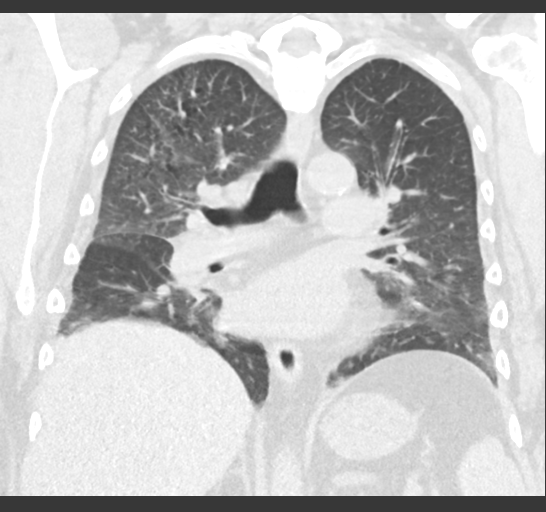

[15 of 36 positions shown; findings below may reference images not displayed]

FINDINGS: Cardiovascular: Mild cardiomegaly. No significant pericardial
fluid/thickening. Left anterior descending, left circumflex and
right coronary atherosclerosis. Atherosclerotic nonaneurysmal
thoracic aorta. Top-normal caliber main pulmonary artery (3.2 cm
diameter).

Mediastinum/Nodes: Questionable hypodense 1.6 cm medial left thyroid
lobe nodule (series 3/ image 28). Unremarkable esophagus. No
pathologically enlarged axillary, mediastinal or gross hilar lymph
nodes, noting limited sensitivity for the detection of hilar
adenopathy on this noncontrast study.

Lungs/Pleura: No pneumothorax. No pleural effusion. Mild to moderate
centrilobular emphysema and diffuse bronchial wall thickening. Two
scattered small solid pulmonary nodules in the left lung, largest 5
mm in the peripheral left upper lobe (series 7/image 70). No acute
consolidative airspace disease or lung masses. No significant air
trapping on the limited expiration sequence. There is interlobular
septal thickening and patchy ground-glass attenuation throughout
both lungs. No significant traction bronchiectasis, architectural
distortion, parenchymal banding or frank honeycombing. No clear
basilar gradient to these findings.

Upper abdomen: Unremarkable.

Musculoskeletal: No aggressive appearing focal osseous lesions. Mild
thoracic spondylosis.
IMPRESSION: 1. No convincing findings of interstitial lung disease. Interlobular
septal thickening and patchy ground-glass attenuation throughout
both lungs. Given the cardiomegaly, these findings are favored to
represent pulmonary edema.
2. Mild-to-moderate centrilobular emphysema and diffuse bronchial
wall thickening, suggesting COPD.
3. Small solid pulmonary nodules, largest 5 mm. No follow-up needed
if patient is low-risk (and has no known or suspected primary
neoplasm). Non-contrast chest CT can be considered in 12 months if
patient is high-risk. This recommendation follows the consensus
statement: Guidelines for Management of Incidental Pulmonary Nodules
Detected on CT Images: From the [HOSPITAL] 0590; Radiology
0590; [DATE].
4. Three-vessel coronary atherosclerosis.
5. Questionable 1.6 cm left thyroid lobe nodule, for which thyroid
ultrasound correlation is warranted if not previously performed.
This follows ACR consensus guidelines: Managing Incidental Thyroid
Nodules Detected on Imaging: White Paper of [REDACTED]. [HOSPITAL] 2188; [DATE].

Aortic Atherosclerosis (FD4X5-SRT.T) and Emphysema (FD4X5-2ZN.0).

## 2018-02-25 ENCOUNTER — Ambulatory Visit: Payer: Medicare Other | Admitting: Pulmonary Disease

## 2018-03-02 ENCOUNTER — Other Ambulatory Visit: Payer: Self-pay | Admitting: Cardiology

## 2018-04-07 ENCOUNTER — Ambulatory Visit: Payer: Medicare Other | Admitting: Pulmonary Disease

## 2018-04-09 ENCOUNTER — Ambulatory Visit (HOSPITAL_COMMUNITY): Payer: Medicare Other

## 2018-04-10 ENCOUNTER — Telehealth: Payer: Self-pay | Admitting: Cardiology

## 2018-04-10 NOTE — Telephone Encounter (Signed)
New Message:    *STAT* If patient is at the pharmacy, call can be transferred to refill team.   1. Which medications need to be refilled? (please list name of each medication and dose if known) spironolactone (ALDACTONE) 25 MG tablet  2. Which pharmacy/location (including street and city if local pharmacy) is medication to be sent to? [facility] [place],  - [address]  3. Do they need a 30 day or 90 day supply? 90 Days

## 2018-04-13 MED ORDER — SPIRONOLACTONE 25 MG PO TABS: 12.5000 mg | ORAL_TABLET | Freq: Every day | ORAL | 1 refills | Status: AC

## 2018-05-18 ENCOUNTER — Other Ambulatory Visit: Payer: Self-pay | Admitting: Cardiology

## 2018-05-18 DIAGNOSIS — I2510 Atherosclerotic heart disease of native coronary artery without angina pectoris: Secondary | ICD-10-CM

## 2018-05-31 ENCOUNTER — Other Ambulatory Visit: Payer: Self-pay

## 2018-06-01 ENCOUNTER — Other Ambulatory Visit: Payer: Self-pay | Admitting: Pulmonary Disease

## 2018-06-01 ENCOUNTER — Telehealth: Payer: Self-pay

## 2018-06-01 ENCOUNTER — Ambulatory Visit (HOSPITAL_COMMUNITY): Payer: Medicare Other

## 2018-06-01 DIAGNOSIS — R918 Other nonspecific abnormal finding of lung field: Secondary | ICD-10-CM

## 2018-06-01 MED ORDER — METOPROLOL SUCCINATE ER 50 MG PO TB24: 50.0000 mg | ORAL_TABLET | ORAL | 0 refills | Status: DC

## 2018-06-01 NOTE — Telephone Encounter (Signed)
-----   Message from Chilton Greathouse, MD sent at 06/01/2018  4:12 PM EST ----- Regarding: RE: Please enter a new CT order Lawrence Delgado- Can you order CT chest without contrast for lung nodules.   ----- Message ----- From: Amado Nash Sent: 06/01/2018   8:07 AM EST To: Chilton Greathouse, MD Subject: Please enter a new CT order                    Dr. Isaiah Delgado,  [name] wife called to reschedule a CT Chest appointment that was on for today 06/01/18.  When I tried to schedule the appointment for a later day this month, I was not able to do so, because the order will expire.  Could you please enter another order for this procedure and I will be happy to call the patient and reschedule their appointment.   Thank you, Brigid Re [facility] Centralized Scheduling

## 2018-06-01 NOTE — Telephone Encounter (Signed)
CT has been ordered. Nothing further is needed.  

## 2018-06-02 ENCOUNTER — Telehealth: Payer: Self-pay | Admitting: Cardiology

## 2018-06-02 MED ORDER — METOPROLOL SUCCINATE ER 50 MG PO TB24: 150.0000 mg | ORAL_TABLET | ORAL | 0 refills | Status: AC

## 2018-06-02 NOTE — Telephone Encounter (Signed)
New Message:      Please call, needs clarification on pt's Metoprolol.

## 2018-06-02 NOTE — Telephone Encounter (Signed)
Pt is to take 3 tabs daily 150 mg not 50 mg .Zack Seal

## 2018-06-10 ENCOUNTER — Ambulatory Visit: Payer: Medicare Other | Admitting: Pulmonary Disease

## 2018-06-19 ENCOUNTER — Ambulatory Visit (HOSPITAL_COMMUNITY): Payer: Medicare Other

## 2018-06-20 ENCOUNTER — Other Ambulatory Visit: Payer: Self-pay | Admitting: Cardiology

## 2018-06-23 ENCOUNTER — Inpatient Hospital Stay (HOSPITAL_COMMUNITY): Payer: Medicare Other

## 2018-06-23 ENCOUNTER — Encounter (HOSPITAL_COMMUNITY): Payer: Self-pay | Admitting: *Deleted

## 2018-06-23 ENCOUNTER — Other Ambulatory Visit: Payer: Self-pay

## 2018-06-23 ENCOUNTER — Inpatient Hospital Stay (HOSPITAL_COMMUNITY)
Admission: EM | Admit: 2018-06-23 | Discharge: 2018-07-29 | DRG: 308 | Disposition: E | Payer: Medicare Other | Attending: Pulmonary Disease | Admitting: Pulmonary Disease

## 2018-06-23 ENCOUNTER — Emergency Department (HOSPITAL_COMMUNITY): Payer: Medicare Other

## 2018-06-23 DIAGNOSIS — I480 Paroxysmal atrial fibrillation: Secondary | ICD-10-CM | POA: Diagnosis present

## 2018-06-23 DIAGNOSIS — Z885 Allergy status to narcotic agent status: Secondary | ICD-10-CM

## 2018-06-23 DIAGNOSIS — Z888 Allergy status to other drugs, medicaments and biological substances status: Secondary | ICD-10-CM

## 2018-06-23 DIAGNOSIS — K219 Gastro-esophageal reflux disease without esophagitis: Secondary | ICD-10-CM | POA: Diagnosis present

## 2018-06-23 DIAGNOSIS — I25119 Atherosclerotic heart disease of native coronary artery with unspecified angina pectoris: Secondary | ICD-10-CM | POA: Diagnosis present

## 2018-06-23 DIAGNOSIS — M170 Bilateral primary osteoarthritis of knee: Secondary | ICD-10-CM | POA: Diagnosis present

## 2018-06-23 DIAGNOSIS — N184 Chronic kidney disease, stage 4 (severe): Secondary | ICD-10-CM | POA: Diagnosis present

## 2018-06-23 DIAGNOSIS — Z955 Presence of coronary angioplasty implant and graft: Secondary | ICD-10-CM

## 2018-06-23 DIAGNOSIS — N189 Chronic kidney disease, unspecified: Secondary | ICD-10-CM | POA: Diagnosis not present

## 2018-06-23 DIAGNOSIS — Z66 Do not resuscitate: Secondary | ICD-10-CM | POA: Diagnosis not present

## 2018-06-23 DIAGNOSIS — Z87891 Personal history of nicotine dependence: Secondary | ICD-10-CM

## 2018-06-23 DIAGNOSIS — N179 Acute kidney failure, unspecified: Secondary | ICD-10-CM | POA: Diagnosis present

## 2018-06-23 DIAGNOSIS — I130 Hypertensive heart and chronic kidney disease with heart failure and stage 1 through stage 4 chronic kidney disease, or unspecified chronic kidney disease: Secondary | ICD-10-CM | POA: Diagnosis present

## 2018-06-23 DIAGNOSIS — G4733 Obstructive sleep apnea (adult) (pediatric): Secondary | ICD-10-CM | POA: Diagnosis present

## 2018-06-23 DIAGNOSIS — I255 Ischemic cardiomyopathy: Secondary | ICD-10-CM | POA: Diagnosis present

## 2018-06-23 DIAGNOSIS — E1165 Type 2 diabetes mellitus with hyperglycemia: Secondary | ICD-10-CM | POA: Diagnosis present

## 2018-06-23 DIAGNOSIS — J690 Pneumonitis due to inhalation of food and vomit: Secondary | ICD-10-CM | POA: Diagnosis not present

## 2018-06-23 DIAGNOSIS — R4020 Unspecified coma: Secondary | ICD-10-CM | POA: Diagnosis present

## 2018-06-23 DIAGNOSIS — E1122 Type 2 diabetes mellitus with diabetic chronic kidney disease: Secondary | ICD-10-CM | POA: Diagnosis present

## 2018-06-23 DIAGNOSIS — I4819 Other persistent atrial fibrillation: Secondary | ICD-10-CM | POA: Diagnosis present

## 2018-06-23 DIAGNOSIS — Z6839 Body mass index (BMI) 39.0-39.9, adult: Secondary | ICD-10-CM

## 2018-06-23 DIAGNOSIS — I5042 Chronic combined systolic (congestive) and diastolic (congestive) heart failure: Secondary | ICD-10-CM | POA: Diagnosis present

## 2018-06-23 DIAGNOSIS — I252 Old myocardial infarction: Secondary | ICD-10-CM

## 2018-06-23 DIAGNOSIS — Z8249 Family history of ischemic heart disease and other diseases of the circulatory system: Secondary | ICD-10-CM

## 2018-06-23 DIAGNOSIS — Z7901 Long term (current) use of anticoagulants: Secondary | ICD-10-CM

## 2018-06-23 DIAGNOSIS — J9601 Acute respiratory failure with hypoxia: Secondary | ICD-10-CM

## 2018-06-23 DIAGNOSIS — Z7951 Long term (current) use of inhaled steroids: Secondary | ICD-10-CM

## 2018-06-23 DIAGNOSIS — F5104 Psychophysiologic insomnia: Secondary | ICD-10-CM | POA: Diagnosis present

## 2018-06-23 DIAGNOSIS — J189 Pneumonia, unspecified organism: Secondary | ICD-10-CM | POA: Diagnosis not present

## 2018-06-23 DIAGNOSIS — G931 Anoxic brain damage, not elsewhere classified: Secondary | ICD-10-CM

## 2018-06-23 DIAGNOSIS — I4901 Ventricular fibrillation: Principal | ICD-10-CM | POA: Diagnosis present

## 2018-06-23 DIAGNOSIS — R570 Cardiogenic shock: Secondary | ICD-10-CM | POA: Diagnosis not present

## 2018-06-23 DIAGNOSIS — I5022 Chronic systolic (congestive) heart failure: Secondary | ICD-10-CM | POA: Diagnosis not present

## 2018-06-23 DIAGNOSIS — K649 Unspecified hemorrhoids: Secondary | ICD-10-CM | POA: Diagnosis present

## 2018-06-23 DIAGNOSIS — E876 Hypokalemia: Secondary | ICD-10-CM

## 2018-06-23 DIAGNOSIS — I462 Cardiac arrest due to underlying cardiac condition: Secondary | ICD-10-CM | POA: Diagnosis present

## 2018-06-23 DIAGNOSIS — Z452 Encounter for adjustment and management of vascular access device: Secondary | ICD-10-CM

## 2018-06-23 DIAGNOSIS — Z9289 Personal history of other medical treatment: Secondary | ICD-10-CM

## 2018-06-23 DIAGNOSIS — R579 Shock, unspecified: Secondary | ICD-10-CM

## 2018-06-23 DIAGNOSIS — I959 Hypotension, unspecified: Secondary | ICD-10-CM | POA: Diagnosis not present

## 2018-06-23 DIAGNOSIS — Z515 Encounter for palliative care: Secondary | ICD-10-CM | POA: Diagnosis not present

## 2018-06-23 DIAGNOSIS — I469 Cardiac arrest, cause unspecified: Secondary | ICD-10-CM | POA: Diagnosis present

## 2018-06-23 DIAGNOSIS — E785 Hyperlipidemia, unspecified: Secondary | ICD-10-CM | POA: Diagnosis present

## 2018-06-23 DIAGNOSIS — G9341 Metabolic encephalopathy: Secondary | ICD-10-CM | POA: Diagnosis present

## 2018-06-23 DIAGNOSIS — H5500 Unspecified nystagmus: Secondary | ICD-10-CM | POA: Diagnosis not present

## 2018-06-23 DIAGNOSIS — Z794 Long term (current) use of insulin: Secondary | ICD-10-CM

## 2018-06-23 DIAGNOSIS — Z79899 Other long term (current) drug therapy: Secondary | ICD-10-CM

## 2018-06-23 DIAGNOSIS — R001 Bradycardia, unspecified: Secondary | ICD-10-CM | POA: Diagnosis not present

## 2018-06-23 LAB — I-STAT ARTERIAL BLOOD GAS, ED
Acid-Base Excess: 3 mmol/L — ABNORMAL HIGH (ref 0.0–2.0)
BICARBONATE: 30.9 mmol/L — AB (ref 20.0–28.0)
O2 SAT: 80 %
Patient temperature: 98.6
TCO2: 33 mmol/L — AB (ref 22–32)
pCO2 arterial: 58.7 mmHg — ABNORMAL HIGH (ref 32.0–48.0)
pH, Arterial: 7.329 — ABNORMAL LOW (ref 7.350–7.450)
pO2, Arterial: 49 mmHg — ABNORMAL LOW (ref 83.0–108.0)

## 2018-06-23 LAB — I-STAT CHEM 8, ED
BUN: 16 mg/dL (ref 6–20)
Calcium, Ion: 0.87 mmol/L — CL (ref 1.15–1.40)
Chloride: 95 mmol/L — ABNORMAL LOW (ref 98–111)
Creatinine, Ser: 1.7 mg/dL — ABNORMAL HIGH (ref 0.61–1.24)
GLUCOSE: 375 mg/dL — AB (ref 70–99)
HEMATOCRIT: 44 % (ref 39.0–52.0)
HEMOGLOBIN: 15 g/dL (ref 13.0–17.0)
POTASSIUM: 5.1 mmol/L (ref 3.5–5.1)
SODIUM: 134 mmol/L — AB (ref 135–145)
TCO2: 34 mmol/L — ABNORMAL HIGH (ref 22–32)

## 2018-06-23 LAB — CBC WITH DIFFERENTIAL/PLATELET
BAND NEUTROPHILS: 7 %
BASOS ABS: 0 10*3/uL (ref 0.0–0.1)
BASOS PCT: 0 %
Eosinophils Absolute: 0.3 10*3/uL (ref 0.0–0.5)
Eosinophils Relative: 1 %
HCT: 47.7 % (ref 39.0–52.0)
Hemoglobin: 15.1 g/dL (ref 13.0–17.0)
LYMPHS PCT: 30 %
Lymphs Abs: 7.9 10*3/uL — ABNORMAL HIGH (ref 0.7–4.0)
MCH: 29.5 pg (ref 26.0–34.0)
MCHC: 31.7 g/dL (ref 30.0–36.0)
MCV: 93.2 fL (ref 80.0–100.0)
MONO ABS: 1.1 10*3/uL — AB (ref 0.1–1.0)
Metamyelocytes Relative: 1 %
Monocytes Relative: 4 %
NEUTROS PCT: 57 %
NRBC: 0.1 % (ref 0.0–0.2)
Neutro Abs: 16.9 10*3/uL — ABNORMAL HIGH (ref 1.7–7.7)
PLATELETS: 312 10*3/uL (ref 150–400)
RBC: 5.12 MIL/uL (ref 4.22–5.81)
RDW: 12.9 % (ref 11.5–15.5)
WBC MORPHOLOGY: INCREASED
WBC: 26.4 10*3/uL — AB (ref 4.0–10.5)
nRBC: 0 /100 WBC

## 2018-06-23 LAB — BASIC METABOLIC PANEL
ANION GAP: 13 (ref 5–15)
Anion gap: 18 — ABNORMAL HIGH (ref 5–15)
BUN: 12 mg/dL (ref 6–20)
BUN: 18 mg/dL (ref 6–20)
CHLORIDE: 94 mmol/L — AB (ref 98–111)
CHLORIDE: 95 mmol/L — AB (ref 98–111)
CO2: 27 mmol/L (ref 22–32)
CO2: 20 mmol/L — ABNORMAL LOW (ref 22–32)
Calcium: 7.2 mg/dL — ABNORMAL LOW (ref 8.9–10.3)
Calcium: 7.6 mg/dL — ABNORMAL LOW (ref 8.9–10.3)
Creatinine, Ser: 1.93 mg/dL — ABNORMAL HIGH (ref 0.61–1.24)
Creatinine, Ser: 2.51 mg/dL — ABNORMAL HIGH (ref 0.61–1.24)
GFR calc Af Amer: 43 mL/min — ABNORMAL LOW (ref >60)
GFR calc Af Amer: 31 mL/min — ABNORMAL LOW (ref >60)
GFR calc non Af Amer: 37 mL/min — ABNORMAL LOW (ref >60)
GFR calc non Af Amer: 27 mL/min — ABNORMAL LOW (ref >60)
GLUCOSE: 499 mg/dL — AB (ref 70–99)
Glucose, Bld: 564 mg/dL (ref 70–99)
Potassium: 3.1 mmol/L — ABNORMAL LOW (ref 3.5–5.1)
Potassium: 3.4 mmol/L — ABNORMAL LOW (ref 3.5–5.1)
SODIUM: 133 mmol/L — AB (ref 135–145)
Sodium: 134 mmol/L — ABNORMAL LOW (ref 135–145)

## 2018-06-23 LAB — COMPREHENSIVE METABOLIC PANEL
ALT: 18 U/L (ref 0–44)
ANION GAP: 13 (ref 5–15)
AST: 50 U/L — ABNORMAL HIGH (ref 15–41)
Albumin: 2.8 g/dL — ABNORMAL LOW (ref 3.5–5.0)
Alkaline Phosphatase: 54 U/L (ref 38–126)
BUN: 11 mg/dL (ref 6–20)
CALCIUM: 8 mg/dL — AB (ref 8.9–10.3)
CHLORIDE: 95 mmol/L — AB (ref 98–111)
CO2: 30 mmol/L (ref 22–32)
Creatinine, Ser: 1.99 mg/dL — ABNORMAL HIGH (ref 0.61–1.24)
GFR calc Af Amer: 41 mL/min — ABNORMAL LOW (ref >60)
GFR calc non Af Amer: 35 mL/min — ABNORMAL LOW (ref >60)
Glucose, Bld: 398 mg/dL — ABNORMAL HIGH (ref 70–99)
POTASSIUM: 2.2 mmol/L — AB (ref 3.5–5.1)
SODIUM: 138 mmol/L (ref 135–145)
Total Bilirubin: 0.9 mg/dL (ref 0.3–1.2)
Total Protein: 6.8 g/dL (ref 6.5–8.1)

## 2018-06-23 LAB — URINALYSIS, ROUTINE W REFLEX MICROSCOPIC
BILIRUBIN URINE: NEGATIVE
Glucose, UA: >=500 mg/dL — AB
Ketones, ur: NEGATIVE mg/dL
Nitrite: NEGATIVE
PH: 5 (ref 5.0–8.0)
Protein, ur: 100 mg/dL — AB
SPECIFIC GRAVITY, URINE: 1.024 (ref 1.005–1.030)

## 2018-06-23 LAB — ABO/RH: ABO/RH(D): O POS

## 2018-06-23 LAB — I-STAT CG4 LACTIC ACID, ED
LACTIC ACID, VENOUS: 4.78 mmol/L — AB (ref 0.5–1.9)
Lactic Acid, Venous: 7.7 mmol/L (ref 0.5–1.9)

## 2018-06-23 LAB — I-STAT VENOUS BLOOD GAS, ED
ACID-BASE EXCESS: 1 mmol/L (ref 0.0–2.0)
BICARBONATE: 29.6 mmol/L — AB (ref 20.0–28.0)
O2 Saturation: 39 %
PCO2 VEN: 60.3 mmHg — AB (ref 44.0–60.0)
PO2 VEN: 26 mmHg — AB (ref 32.0–45.0)
TCO2: 31 mmol/L (ref 22–32)
pH, Ven: 7.299 (ref 7.250–7.430)

## 2018-06-23 LAB — CBC
HEMATOCRIT: 40.1 % (ref 39.0–52.0)
HEMOGLOBIN: 13.6 g/dL (ref 13.0–17.0)
MCH: 29.7 pg (ref 26.0–34.0)
MCHC: 33.9 g/dL (ref 30.0–36.0)
MCV: 87.6 fL (ref 80.0–100.0)
Platelets: 273 10*3/uL (ref 150–400)
RBC: 4.58 MIL/uL (ref 4.22–5.81)
RDW: 12.9 % (ref 11.5–15.5)
WBC: 21.4 10*3/uL — ABNORMAL HIGH (ref 4.0–10.5)
nRBC: 0 % (ref 0.0–0.2)

## 2018-06-23 LAB — TYPE AND SCREEN
ABO/RH(D): O POS
Antibody Screen: NEGATIVE

## 2018-06-23 LAB — MAGNESIUM: Magnesium: 1.2 mg/dL — ABNORMAL LOW (ref 1.7–2.4)

## 2018-06-23 LAB — POCT I-STAT 3, ART BLOOD GAS (G3+)
BICARBONATE: 24.4 mmol/L (ref 20.0–28.0)
O2 SAT: 97 %
PO2 ART: 92 mmHg (ref 83.0–108.0)
TCO2: 26 mmol/L (ref 22–32)
pCO2 arterial: 36.3 mmHg (ref 32.0–48.0)
pH, Arterial: 7.435 (ref 7.350–7.450)

## 2018-06-23 LAB — GLUCOSE, CAPILLARY
GLUCOSE-CAPILLARY: 486 mg/dL — AB (ref 70–99)
GLUCOSE-CAPILLARY: 524 mg/dL — AB (ref 70–99)
Glucose-Capillary: 545 mg/dL (ref 70–99)

## 2018-06-23 LAB — LIPASE, BLOOD: Lipase: 33 U/L (ref 11–51)

## 2018-06-23 LAB — PROTIME-INR
INR: 1.45
PROTHROMBIN TIME: 17.5 s — AB (ref 11.4–15.2)

## 2018-06-23 LAB — I-STAT TROPONIN, ED: TROPONIN I, POC: 0.07 ng/mL (ref 0.00–0.08)

## 2018-06-23 LAB — MRSA PCR SCREENING: MRSA by PCR: NEGATIVE

## 2018-06-23 MED ORDER — INSULIN ASPART 100 UNIT/ML ~~LOC~~ SOLN: 0.0000 [IU] | SUBCUTANEOUS | Status: DC

## 2018-06-23 MED ORDER — PHENYLEPHRINE HCL-NACL 10-0.9 MG/250ML-% IV SOLN
0.0000 ug/min | INTRAVENOUS | Status: DC
  Administered 2018-06-23: 25 ug/min via INTRAVENOUS
  Administered 2018-06-24: 110 ug/min via INTRAVENOUS
  Administered 2018-06-24 (×2): 120 ug/min via INTRAVENOUS
  Administered 2018-06-24: 110 ug/min via INTRAVENOUS
  Filled 2018-06-23 (×5): qty 250

## 2018-06-23 MED ORDER — SODIUM CHLORIDE 0.9 % IV SOLN
1.0000 g | Freq: Once | INTRAVENOUS | Status: DC
  Filled 2018-06-23 (×2): qty 10

## 2018-06-23 MED ORDER — MAGNESIUM SULFATE 2 GM/50ML IV SOLN
2.0000 g | Freq: Once | INTRAVENOUS | Status: AC
  Administered 2018-06-23: 2 g via INTRAVENOUS
  Filled 2018-06-23: qty 50

## 2018-06-23 MED ORDER — SODIUM CHLORIDE 0.9 % IV SOLN
250.0000 mL | INTRAVENOUS | Status: DC
  Administered 2018-06-25 (×2): 250 mL via INTRAVENOUS

## 2018-06-23 MED ORDER — DOCUSATE SODIUM 50 MG/5ML PO LIQD: 100.0000 mg | Freq: Two times a day (BID) | ORAL | Status: DC | PRN

## 2018-06-23 MED ORDER — INSULIN REGULAR(HUMAN) IN NACL 100-0.9 UT/100ML-% IV SOLN
INTRAVENOUS | Status: DC
  Administered 2018-06-23: 4.9 [IU]/h via INTRAVENOUS
  Administered 2018-06-24: 16.3 [IU]/h via INTRAVENOUS
  Administered 2018-06-24: 8.1 [IU]/h via INTRAVENOUS
  Administered 2018-06-25: 9.6 [IU]/h via INTRAVENOUS
  Administered 2018-06-25: 11 [IU]/h via INTRAVENOUS
  Administered 2018-06-26: 9.7 [IU]/h via INTRAVENOUS
  Filled 2018-06-23 (×6): qty 100

## 2018-06-23 MED ORDER — ONDANSETRON HCL 4 MG/2ML IJ SOLN: 4.0000 mg | Freq: Four times a day (QID) | INTRAMUSCULAR | Status: DC | PRN

## 2018-06-23 MED ORDER — FENTANYL CITRATE (PF) 100 MCG/2ML IJ SOLN: 100.0000 ug | INTRAMUSCULAR | Status: DC | PRN

## 2018-06-23 MED ORDER — SODIUM CHLORIDE 0.9 % IV SOLN: INTRAVENOUS | Status: DC | PRN

## 2018-06-23 MED ORDER — VASOPRESSIN 20 UNIT/ML IV SOLN
0.0300 [IU]/min | INTRAVENOUS | Status: DC
  Administered 2018-06-23: 0.03 [IU]/min via INTRAVENOUS
  Filled 2018-06-23 (×2): qty 2

## 2018-06-23 MED ORDER — LACTATED RINGERS IV SOLN
INTRAVENOUS | Status: DC
  Administered 2018-06-24 – 2018-06-25 (×2): via INTRAVENOUS

## 2018-06-23 MED ORDER — NOREPINEPHRINE 4 MG/250ML-% IV SOLN
2.0000 ug/min | INTRAVENOUS | Status: DC
  Administered 2018-06-23: 5 ug/min via INTRAVENOUS
  Filled 2018-06-23: qty 250

## 2018-06-23 MED ORDER — CHLORHEXIDINE GLUCONATE 0.12% ORAL RINSE (MEDLINE KIT)
15.0000 mL | Freq: Two times a day (BID) | OROMUCOSAL | Status: DC
  Administered 2018-06-23 – 2018-06-29 (×12): 15 mL via OROMUCOSAL

## 2018-06-23 MED ORDER — MAGNESIUM SULFATE 2 GM/50ML IV SOLN: 2.0000 g | Freq: Once | INTRAVENOUS | Status: DC

## 2018-06-23 MED ORDER — NOREPINEPHRINE 4 MG/250ML-% IV SOLN: 0.0000 ug/min | INTRAVENOUS | Status: DC

## 2018-06-23 MED ORDER — CALCIUM GLUCONATE-NACL 1-0.675 GM/50ML-% IV SOLN
1.0000 g | Freq: Once | INTRAVENOUS | Status: AC
  Administered 2018-06-23: 1000 mg via INTRAVENOUS
  Filled 2018-06-23: qty 50

## 2018-06-23 MED ORDER — PIPERACILLIN-TAZOBACTAM 3.375 G IVPB 30 MIN
3.3750 g | Freq: Once | INTRAVENOUS | Status: AC
  Administered 2018-06-23: 3.375 g via INTRAVENOUS
  Filled 2018-06-23: qty 50

## 2018-06-23 MED ORDER — ORAL CARE MOUTH RINSE
15.0000 mL | OROMUCOSAL | Status: DC
  Administered 2018-06-23 – 2018-06-29 (×54): 15 mL via OROMUCOSAL

## 2018-06-23 MED ORDER — SODIUM CHLORIDE 0.9 % IV SOLN
3.0000 g | Freq: Four times a day (QID) | INTRAVENOUS | Status: DC
  Administered 2018-06-23 – 2018-06-25 (×7): 3 g via INTRAVENOUS
  Filled 2018-06-23 (×9): qty 3

## 2018-06-23 MED ORDER — ACETAMINOPHEN 325 MG PO TABS: 650.0000 mg | ORAL_TABLET | ORAL | Status: DC | PRN

## 2018-06-23 MED ORDER — SODIUM CHLORIDE 0.9 % IV BOLUS
1000.0000 mL | Freq: Once | INTRAVENOUS | Status: AC
  Administered 2018-06-23: 1000 mL via INTRAVENOUS

## 2018-06-23 MED ORDER — NOREPINEPHRINE 4 MG/250ML-% IV SOLN
5.0000 ug/min | INTRAVENOUS | Status: DC
  Administered 2018-06-23: 30 ug/min via INTRAVENOUS
  Administered 2018-06-25: 10 ug/min via INTRAVENOUS
  Administered 2018-06-25: 16 ug/min via INTRAVENOUS
  Administered 2018-06-26: 10 ug/min via INTRAVENOUS
  Administered 2018-06-26: 12 ug/min via INTRAVENOUS
  Administered 2018-06-27 – 2018-06-28 (×3): 5 ug/min via INTRAVENOUS
  Filled 2018-06-23 (×8): qty 250

## 2018-06-23 MED ORDER — SODIUM CHLORIDE 0.9 % IV SOLN
250.0000 mL | INTRAVENOUS | Status: DC
  Administered 2018-06-23: 250 mL via INTRAVENOUS

## 2018-06-23 MED ORDER — SODIUM CHLORIDE 0.9 % IV SOLN: 250.0000 mL | INTRAVENOUS | Status: DC

## 2018-06-23 MED ORDER — MIDAZOLAM HCL 2 MG/2ML IJ SOLN: 2.0000 mg | INTRAMUSCULAR | Status: DC | PRN

## 2018-06-23 MED ORDER — FAMOTIDINE 40 MG/5ML PO SUSR
20.0000 mg | Freq: Two times a day (BID) | ORAL | Status: DC
  Administered 2018-06-23 – 2018-06-26 (×6): 20 mg
  Filled 2018-06-23 (×6): qty 2.5

## 2018-06-23 MED ORDER — POTASSIUM CHLORIDE 10 MEQ/100ML IV SOLN
10.0000 meq | INTRAVENOUS | Status: AC
  Administered 2018-06-23 (×4): 10 meq via INTRAVENOUS
  Filled 2018-06-23 (×4): qty 100

## 2018-06-23 MED ORDER — POTASSIUM CHLORIDE 10 MEQ/100ML IV SOLN: 10.0000 meq | INTRAVENOUS | Status: DC

## 2018-06-23 MED FILL — Medication: Qty: 1 | Status: AC

## 2018-06-23 NOTE — Progress Notes (Signed)
Responded to ED page to support cardiac arrest patient and staff.  Per staff.. call was made to patient wife.  Wife arrived, visit at bedside along with patient mother. Pt is supported by other family members.  Family was updated by medical staff ( nurse Clydie BraunKaren). Pt is schedule to go to ICU.  Will follow as needed.  Lawrence Delgado, Lawrence Delgado, [place], [facility], Pager [phone]

## 2018-06-23 NOTE — Consult Note (Addendum)
Cardiology Consultation:   Patient ID: JCION BUDDENHAGEN; [id number]; 04/24/1956   Admit date: 06/21/2018 Date of Consult: 06/13/2018  Primary Care Provider: Samuel Jester, DO Primary Cardiologist: No primary care provider on file. Primary Electrophysiologist:  None   Patient Profile:   Lawrence Delgado is a 62 y.o. 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  History of Present Illness:   Lawrence Delgado was at his PCP office when he collapsed.  CPR was started by staff.  He was in V. fib by EMS and was shocked once.  He required prolonged CPR, but had ROSC after about 30 minutes.  He had 3 rounds of epinephrine and 3 L of fluid in the field.  He was intubated in the field.  There was a small amount of blood in the ET tube at the ER.  He was in/out of PEA on arrival to the emergency room but shortly afterwards, had carotid pulses.  A bedside ultrasound did show some cardiac activity and he had some agonal breathing.  His oxygen saturations were in the mid 70s on 100% O2.  His chest x-ray showed dense right-sided airspace disease.  He was admitted and is being managed by the critical care team.  He is intubated and has a central line in place.  He is on norepinephrine to maintain his blood pressure and vasopressin was recently started.  He is to get the norepinephrine weaned off.  The decision was made not to cool him because of prolonged downtime and bleeding on admission.    Cardiology was asked to evaluate him.  His wife was with him in the doctor's office today.  He was in his usual state of health.  He had taken only his Eliquis this a.m.  He was not having chest pain or shortness of breath.    He was sitting in the waiting room at the doctor's  office, and said I feel dizzy, then collapsed.  He has otherwise been in his usual state of health.  He has chronic insomnia, no recent change.  He has chronic dyspnea on exertion, no recent change.  He has not been waking with lower extremity edema.  He had a small amount of bright red blood per rectum, most likely secondary to hemorrhoids.  He has not had any recent illnesses, fevers or chills.  He has not had cough or cold symptoms.   Past Medical History:  Diagnosis Date  . Arthritis    " In my knees   . Atrial fibrillation (HCC) 02/2017  . CAD (coronary artery disease)   . CHF (congestive heart failure) (HCC)   . DM (diabetes mellitus) (HCC)    insulin dependent  . GERD (gastroesophageal reflux disease)   . HLD (hyperlipidemia)   . HTN (hypertension)   . MI (myocardial infarction) (HCC)   . Obesity   . Sleep apnea     Past Surgical History:  Procedure Laterality Date  . CARDIAC CATHETERIZATION     with coronary angiography   . CARDIOVERSION N/A 05/05/2015   Procedure: CARDIOVERSION;  Surgeon: Vesta Mixer, MD;  Location: [facility] ENDOSCOPY;  Service: Cardiovascular;  Laterality: N/A;  . CARDIOVERSION N/A 02/14/2017  Procedure: CARDIOVERSION;  Surgeon: Elease Hashimoto Deloris Ping, MD;  Location: [facility] ENDOSCOPY;  Service: Cardiovascular;  Laterality: N/A;  . CORONARY STENT PLACEMENT     eluting stent placement in the culpric lesion of the LAD  . TEE WITHOUT CARDIOVERSION N/A 05/05/2015   Procedure: TRANSESOPHAGEAL ECHOCARDIOGRAM (TEE);  Surgeon: Vesta Mixer, MD;  Location: [facility] ENDOSCOPY;  Service: Cardiovascular;  Laterality: N/A;     Prior to Admission medications   Medication Sig Start Date End Date Taking? Authorizing Provider  amiodarone (PACERONE) 200 MG tablet Take 1 tablet (200 mg total) by mouth daily. 08/15/17  Yes Newman Nip, NP  apixaban (ELIQUIS) 5 MG TABS tablet Take 1 tablet (5 mg total) by mouth 2 (two) times daily. 09/13/15  Yes Crenshaw, Madolyn Frieze, MD  ENTRESTO 24-26 MG  TAKE  (1)  TABLET TWICE A DAY. Patient taking differently: Take 1 tablet by mouth 2 (two) times daily.  06/22/18  Yes Lewayne Bunting, MD  fluticasone furoate-vilanterol (BREO ELLIPTA) 200-25 MCG/INH AEPB Inhale 1 puff into the lungs daily. 02/05/18  Yes Mannam, Praveen, MD  furosemide (LASIX) 40 MG tablet TAKE 1 TABLET DAILY Patient taking differently: Take 40 mg by mouth See admin instructions. Take 40mg  by mouth once daily. May take and additional 40mg  once daily as needed for 2-3 pound weight gain. 05/18/18  Yes Lewayne Bunting, MD  insulin lispro (HUMALOG) 100 UNIT/ML injection Inject 7-17 Units into the skin See admin instructions. Three times daily per Sliding Scale   Yes [provider]  insulin lispro protamine-lispro (HUMALOG 75/25 MIX) (75-25) 100 UNIT/ML SUSP injection Inject 50 Units into the skin 2 (two) times daily with a meal.    Yes [provider]  metFORMIN (GLUCOPHAGE) 500 MG tablet Take 500 mg by mouth 2 (two) times daily with a meal.    Yes [provider]  metoprolol succinate (TOPROL-XL) 50 MG 24 hr tablet Take 3 tablets (150 mg total) by mouth as directed. Take with or immediately following a meal. Patient taking differently: Take 150 mg by mouth daily. Take with or immediately following a meal. 06/02/18  Yes Crenshaw, Madolyn Frieze, MD  nitroGLYCERIN (NITROSTAT) 0.4 MG SL tablet Place 1 tablet (0.4 mg total) under the tongue every 5 (five) minutes as needed. Patient taking differently: Place 0.4 mg under the tongue every 5 (five) minutes as needed for chest pain.  03/27/16  Yes Lewayne Bunting, MD  rosuvastatin (CRESTOR) 40 MG tablet Take 1 tablet (40 mg total) by mouth daily. TAKE 1 TABLET (40mg ) BY MOUTH DAILY Patient taking differently: Take 40 mg by mouth daily.  02/18/18  Yes Lewayne Bunting, MD  spironolactone (ALDACTONE) 25 MG tablet Take 0.5 tablets (12.5 mg total) by mouth daily. 04/13/18  Yes Lewayne Bunting, MD  Vitamin D, Ergocalciferol,  (DRISDOL) 50000 UNITS CAPS Take 50,000 Units by mouth 2 (two) times a week. Monday and Thursday   Yes [provider]    Inpatient Medications: Scheduled Meds: . famotidine  20 mg Per Tube BID  . insulin aspart  0-15 Units Subcutaneous Q4H   Continuous Infusions: . sodium chloride    . sodium chloride 250 mL (Jul 17, 2018 1332)  . sodium chloride    . sodium chloride    . ampicillin-sulbactam (UNASYN) IV    . calcium gluconate    . lactated ringers    . magnesium sulfate 1 - 4 g bolus IVPB    . norepinephrine (LEVOPHED) Adult infusion 30 mcg/min (2018/07/17 1603)  .  potassium chloride    . vasopressin (PITRESSIN) infusion - *FOR SHOCK* 0.03 Units/min (03-Aug-2017 1608)   PRN Meds: Place/Maintain arterial line **AND** sodium chloride, acetaminophen, docusate, fentaNYL (SUBLIMAZE) injection, midazolam, ondansetron (ZOFRAN) IV  Allergies:    Allergies  Allergen Reactions  . Codeine Nausea And Vomiting  . Clopidogrel Bisulfate Itching and Rash    Social History:   Social History   Socioeconomic History  . Marital status: Married    Spouse name: Not on file  . Number of children: Not on file  . Years of education: Not on file  . Highest education level: Not on file  Occupational History  . Not on file  Social Needs  . Financial resource strain: Not on file  . Food insecurity:    Worry: Not on file    Inability: Not on file  . Transportation needs:    Medical: Not on file    Non-medical: Not on file  Tobacco Use  . Smoking status: Former Smoker    Packs/day: 3.00    Years: 25.00    Pack years: 75.00    Last attempt to quit: 2005    Years since quitting: 14.9  . Smokeless tobacco: Never Used  . Tobacco comment: quit smoking in 2005  Substance and Sexual Activity  . Alcohol use: No  . Drug use: No  . Sexual activity: Not on file  Lifestyle  . Physical activity:    Days per week: Not on file    Minutes per session: Not on file  . Stress: Not on file    Relationships  . Social connections:    Talks on phone: Not on file    Gets together: Not on file    Attends religious service: Not on file    Active member of club or organization: Not on file    Attends meetings of clubs or organizations: Not on file    Relationship status: Not on file  . Intimate partner violence:    Fear of current or ex partner: Not on file    Emotionally abused: Not on file    Physically abused: Not on file    Forced sexual activity: Not on file  Other Topics Concern  . Not on file  Social History Narrative  . Not on file    Family History:   Family History  Problem Relation Age of Onset  . Heart attack Mother   . Heart attack Father    Family Status:  Family Status  Relation Name Status  . Mother  Alive  . Father  Deceased at age 452012  . MGM  Deceased  . MGF  Deceased  . PGM  Deceased  . PGF  Deceased    ROS:  Please see the history of present illness.  All other ROS reviewed and negative.     Physical Exam/Data:   Vitals:   03-Aug-2017 1515 03-Aug-2017 1530 03-Aug-2017 1600 03-Aug-2017 1615  BP: (!) 83/69 108/62 103/71 118/83  Pulse: (!) 56 (!) 53 (!) 47 (!) 51  Resp: (!) 24 (!) 22 20 (!) 21  Temp:   97.9 F (36.6 C) 97.9 F (36.6 C)  SpO2: 97% 100% 100% 100%  Weight:      Height:        Intake/Output Summary (Last 24 hours) at Feb 12, 2018 1645 Last data filed at Feb 12, 2018 1603 Gross per 24 hour  Intake 1350 ml  Output -  Net 1350 ml   Filed Weights   03-Aug-2017 1500  Weight: 125.1 kg   Body mass index is 36.39 kg/m.  General:  Well nourished, morbidly obese male, on the vent, not currently sedated HEENT: normal for age Lymph: no adenopathy Neck: no JVD seen, not able to assess well secondary to body habitus, equipment Endocrine:  No thryomegaly Vascular: No carotid bruits; 4/4 extremity pulses 1+ Cardiac:  normal S1, S2; RRR; no murmur  Lungs: Bilateral rhonchorous breath sounds with rales, no wheezing  Abd: soft, nontender, no  hepatomegaly  Ext: no edema Musculoskeletal:  No deformities, not able to assess strength Skin: warm and dry  Neuro: On the vent, not currently sedated, moves a little when stimulated  EKG:  The EKG was personally reviewed and demonstrates: Sinus rhythm, heart rate 89, mild lateral ST depression, different from 11/26/2017 ECG  Telemetry:  Telemetry was personally reviewed and demonstrates: Sinus rhythm, sinus bradycardia, no significant ectopy  Relevant CV Studies:  ECHO: 02/2017 - Left ventricle: The cavity size was mildly dilated. Systolic   function was normal. Features are consistent with a pseudonormal   left ventricular filling pattern, with concomitant abnormal   relaxation and increased filling pressure (grade 2 diastolic   dysfunction). Doppler parameters are consistent with elevated   ventricular end-diastolic filling pressure. - Right ventricle: Systolic function was normal. - Tricuspid valve: There was mild regurgitation. - Pulmonary arteries: Systolic pressure was within the normal   range. - Inferior vena cava: The vessel was normal in size. - Pericardium, extracardiac: There was no pericardial effusion.  Impressions:  - Poor echo image quality even with echocontrast. LVEF estimated at   35-40% with hypokinesis in the mid and apical walls.  CATH: 12/30/2003  INDICATION:  Mr. Pardee is a 62 year old gentleman who presented on Dec 26, 2003, with anterior myocardial infarction.  Dr. Gerri Spore performed drug-  eluting stent placement in the culprit lesion of the LAD.  He also had a  severe stenosis of the proximal RCA.  Patient now returns for planned staged  intervention.   PROCEDURAL TECHNIQUE:  Informed consent was obtained.  Under 1% lidocaine  local anesthesia, a 6 French sheath was placed in the right common femoral  artery using the modified Seldinger technique.  Anticoagulation was  initiated with Bivalirudin.  ACT was confirmed to be greater than 225   seconds.  The patient had been maintained on Plavix since the time of his  initial presentation.   A 6 [place] guide was advanced over a wire and engaged in the ostium of  the RCA.  A Luge wire was advanced to the distal vessel without difficulty.  The distal portion of the lesion was directly stented using a 3.5 x 24 mm  TAXUS at 18 atmospheres.  The proximal portion of the lesion was then  stented using an overlapping 3.5 x 20 mm TAXUS also deployed at 18  atmospheres.  The entirety of both stents was then post dilated using a 3.75  mm power sail balloon at 18 atmospheres.  Careful attention was paid to the  region of overlap.  The patient tolerated the procedure well and was  transferred to the holding room in stable condition.  Bivalirudin infusion  was discontinued at the end of the case.   COMPLICATIONS:  None.   IMPRESSION/PLAN:  Successful drug-eluting stent placement reducing stenosis  from 80% to 0% in the mid RCA.  Patient will be maintained on Plavix for at  least a year.  The sheath will be removed two hours after completion  of the  procedure.   Laboratory Data:  Chemistry Recent Labs  Lab July 03, 2018 1259 Jul 03, 2018 1301 July 03, 2018 1455  NA 138 134* 134*  K 2.2* 5.1 3.1*  CL 95* 95* 94*  CO2 30  --  27  GLUCOSE 398* 375* 499*  BUN 11 16 12   CREATININE 1.99* 1.70* 1.93*  CALCIUM 8.0*  --  7.2*  GFRNONAA 35*  --  37*  GFRAA 41*  --  43*  ANIONGAP 13  --  13    Lab Results  Component Value Date   ALT 18 2018/07/03   AST 50 (H) 07-03-2018   ALKPHOS 54 Jul 03, 2018   BILITOT 0.9 Jul 03, 2018   Hematology Recent Labs  Lab 2018/07/03 1259 2018-07-03 1301  WBC 26.4*  --   RBC 5.12  --   HGB 15.1 15.0  HCT 47.7 44.0  MCV 93.2  --   MCH 29.5  --   MCHC 31.7  --   RDW 12.9  --   PLT 312  --    Cardiac Enzymes  Recent Labs  Lab July 03, 2018 1301  TROPIPOC 0.07    Magnesium:  Magnesium  Date Value Ref Range Status  Jul 03, 2018 1.2 (L) 1.7 - 2.4 mg/dL Final     Comment:    Performed at [facility], [address] [place], [place] [address]    Radiology/Studies:  Dg Chest Port 1 View  Result Date: 07/03/2018 CLINICAL DATA:  Central catheter placement.  Hypoxia. EXAM: PORTABLE CHEST 1 VIEW COMPARISON:  03-Jul-2018 study obtained earlier in the day FINDINGS: Endotracheal tube tip is 4.9 cm above the carina. Nasogastric tube tip and side port are below the diaphragm. Central catheter tip is in the superior vena cava. No pneumothorax. There is widespread airspace consolidation throughout much of the right lung, stable. There is interstitial edema with perihilar interstitial edema. There is cardiomegaly with pulmonary venous hypertension. No adenopathy. No bone lesions. IMPRESSION: Tube and catheter positions as described without pneumothorax. Pulmonary vascular congestion with perihilar interstitial edema. Extensive airspace consolidation, likely pneumonia, throughout much of the right lung. Electronically Signed   By: Bretta Bang III M.D.   On: Jul 03, 2018 15:47   Dg Chest Port 1 View  Result Date: July 03, 2018 CLINICAL DATA:  Post arrest. Status post endotracheal and esophagogastric tube placement. EXAM: PORTABLE CHEST 1 VIEW COMPARISON:  PA and lateral chest x-ray dated Dec 16, 2017 FINDINGS: The endotracheal tube tip lies at the inferior margin of the clavicular heads approximately 4 cm above the carina. The esophagogastric tube tip and proximal port project below the expected location of the GE junction. There is confluent airspace opacity throughout much of the right lung which is new. The left lung exhibits mildly increased interstitial markings slightly more conspicuous overall. The pulmonary vascularity is engorged and indistinct. The cardiac silhouette remains enlarged. External pacemaker defibrillator pads are present. There are fractures of the lateral aspects of the right fifth through seventh ribs. IMPRESSION: Confluent airspace  opacity throughout much of the right lung consistent with asymmetric pulmonary edema or other alveolar filling process. Coarse interstitial markings throughout the left lung without alveolar infiltrates. Mild cardiomegaly with pulmonary vascular congestion. Displaced fractures of the posterolateral aspects of the right fifth, sixth, and seventh ribs. No pneumothorax. The support tubes are in reasonable position. Electronically Signed   By: David  [place] M.D.   On: 07-03-18 13:28    Assessment and Plan:   1.  Cardiac arrest: -The cause of his cardiac arrest  may be a primary arrhythmia - Ischemia is also a concern, ECG is not a STEMI but has some ST depression in the lateral leads not seen 11/2017 ECG - Cycle cardiac enzymes, echocardiogram is ordered. -Follow-up on the results of these and then decide on further evaluation.  2.  Ischemic cardiomyopathy: -Echocardiogram is ordered. - No beta-blocker due to hypotension and bradycardia - If his EF is less than 30%, or significant arrhythmia is noted, he may need an ICD.  3.  Chronic combined systolic and diastolic CHF: -Per his wife, volume status and respiratory status have been at baseline.  However, his baseline is poor, she does not think he can walk much over 100 feet without stopping for breath. - His weight is in line or a little below weights in the system for the past year. - We will leave diuresis to the pulmonary team as they are managing his respiratory status -Sherryll Burger is on hold due to elevated creatinine   Active Problems:   Cardiac arrest [facility])   For questions or updates, please contact [facility] Please consult [email or web address] for contact info under Cardiology/STEMI.   Signed, Theodore Demark, PA-C  08-Jul-2018 4:45 PM   I have examined the patient and reviewed assessment and plan and discussed with patient.  Agree with above as stated.  HR improved.  Will watch for bradycardia.  No indication for temp pacer.   Supportive care.  Monitor for CNS recovery.  Holding beta blocker.  Some ST depressions noted but had prolonged CPR.  Cr increased as well.    Will follow.  Lance Muss

## 2018-06-23 NOTE — Progress Notes (Signed)
Pt observed to be in vtach. BP steadily dropping, aline flattening. Pt noted to be cyanotic. Pt sustained in vtach for roughly 2 minutes. [facility] notified. Family (wife) notified.  Pt spontaneously came out of VT. BP sustaining. Orders from Dr. Arsenio LoaderSommer to switch Levo gtt to Neo gtt.  Will follow orders and continue to monitor.  2330- updated family at bedside

## 2018-06-23 NOTE — ED Provider Notes (Signed)
[facility] Provider Note   CSN: [id number] Arrival date & time: 06/11/2018  1256     History   Chief Complaint Chief Complaint  Patient presents with  . Cardiac Arrest    HPI Marchelle GearingJoseph R Jablonski is a 62 y.o. male.  He arrives in cardiac arrest level 5 caveat.  Patient was at his PCPs office for unclear reason when he acutely collapsed and was apneic and pulseless.  CPR was started by staff.  EMS had him in V. fib and shocked x1.  He is been between [facility] and [facility] for 30 minutes of transport.  He was intubated by them there was no blood in the airway but after the ET tube was then he had some blood from the ET tube.  Blood sugar was 300s+.  He is received 3 rounds of epi.  On arrival to the trauma bay he has carotid pulses.  Bedside ultrasound showed cardiac activity.  He has some agonal breathing.  Sats are low in the mid 70s on 100%.  The history is provided by the EMS personnel.  Cardiac Arrest  Witnessed by:  Healthcare provider Incident location: pcp office. Time since incident:  45 minutes Time before BLS initiated:  1-2 minutes Condition upon EMS arrival:  Unresponsive Pulse:  Absent Initial cardiac rhythm per EMS:  Ventricular fibrillation Treatments prior to arrival:  Intubation, ACLS protocol and vascular access Medications given prior to ED:  Epinephrine IV access type:  Intraosseous Airway:  Intubation prior to arrival Rhythm on admission to ED:  PEA   Past Medical History:  Diagnosis Date  . Arthritis    " In my knees   . Atrial fibrillation (HCC) 02/2017  . CAD (coronary artery disease)   . CHF (congestive heart failure) (HCC)   . DM (diabetes mellitus) (HCC)    insulin dependent  . GERD (gastroesophageal reflux disease)   . HLD (hyperlipidemia)   . HTN (hypertension)   . MI (myocardial infarction) (HCC)   . Obesity   . Sleep apnea     Patient Active Problem List   Diagnosis Date Noted  . Visit for monitoring Tikosyn  therapy 03/08/2017  . Persistent atrial fibrillation 03/04/2017  . Dyspnea and respiratory abnormalities   . Hypoxia   . Pleural effusion   . Acute systolic CHF (congestive heart failure) (HCC) 05/04/2015  . Atrial fibrillation (HCC) 05/02/2015  . Atrial fibrillation with RVR (HCC)   . CHEST PAIN 05/04/2010  . Morbid obesity (HCC) 12/22/2008  . Sleep apnea 12/22/2008  . EDEMA 12/22/2008  . DYSPNEA 12/22/2008  . Type II diabetes mellitus (HCC) 06/03/2008  . Hyperlipidemia 06/03/2008  . Essential hypertension 06/03/2008  . Coronary atherosclerosis 06/03/2008  . GERD 06/03/2008    Past Surgical History:  Procedure Laterality Date  . CARDIAC CATHETERIZATION     with coronary angiography   . CARDIOVERSION N/A 05/05/2015   Procedure: CARDIOVERSION;  Surgeon: Vesta MixerPhilip J Nahser, MD;  [facility];  Service: Cardiovascular;  Laterality: N/A;  . CARDIOVERSION N/A 02/14/2017   Procedure: CARDIOVERSION;  Surgeon: Vesta MixerNahser, Philip J, MD;  [facility];  Service: Cardiovascular;  Laterality: N/A;  . CORONARY STENT PLACEMENT     eluting stent placement in the culpric lesion of the LAD  . TEE WITHOUT CARDIOVERSION N/A 05/05/2015   Procedure: TRANSESOPHAGEAL ECHOCARDIOGRAM (TEE);  Surgeon: Vesta MixerPhilip J Nahser, MD;  [facility];  Service: Cardiovascular;  Laterality: N/A;        Home Medications  Prior to Admission medications   Medication Sig Start Date End Date Taking? Authorizing Provider  amiodarone (PACERONE) 200 MG tablet Take 1 tablet (200 mg total) by mouth daily. 08/15/17   Newman Nip, NP  apixaban (ELIQUIS) 5 MG TABS tablet Take 1 tablet (5 mg total) by mouth 2 (two) times daily. 09/13/15   Crenshaw, Madolyn Frieze, MD  ENTRESTO 24-26 MG TAKE  (1)  TABLET TWICE A DAY. 06/22/18   Lewayne Bunting, MD  fluticasone furoate-vilanterol (BREO ELLIPTA) 200-25 MCG/INH AEPB Inhale 1 puff into the lungs daily. 02/05/18   Mannam, Colbert Coyer, MD  furosemide (LASIX) 40 MG  tablet Take 40 mg by mouth daily. Can take additional 40mg  as needed for weight gain of 2-3lbs    [provider]  furosemide (LASIX) 40 MG tablet TAKE 1 TABLET DAILY 05/18/18   Lewayne Bunting, MD  insulin lispro (HUMALOG) 100 UNIT/ML injection Inject 7-17 Units into the skin 3 (three) times daily before meals. Sliding scale    [provider]  insulin lispro protamine-lispro (HUMALOG 75/25 MIX) (75-25) 100 UNIT/ML SUSP injection Inject 50 Units into the skin 2 (two) times daily with a meal.     [provider]  metFORMIN (GLUCOPHAGE) 500 MG tablet Take 500 mg by mouth 2 (two) times daily with a meal.     [provider]  metoprolol succinate (TOPROL-XL) 50 MG 24 hr tablet Take 3 tablets (150 mg total) by mouth as directed. Take with or immediately following a meal. 06/02/18   Crenshaw, Madolyn Frieze, MD  nitroGLYCERIN (NITROSTAT) 0.4 MG SL tablet Place 1 tablet (0.4 mg total) under the tongue every 5 (five) minutes as needed. 03/27/16   Lewayne Bunting, MD  nitroGLYCERIN (NITROSTAT) 0.4 MG SL tablet PLACE 1 TABLET UNDER THE TONGUE AT ONSET OF CHEST PAIN EVERY 5 MINTUES UP TO 3 TIMES AS NEEDED 11/27/17   Lewayne Bunting, MD  rosuvastatin (CRESTOR) 40 MG tablet Take 1 tablet (40 mg total) by mouth daily. TAKE 1 TABLET (40mg ) BY MOUTH DAILY 02/18/18   Lewayne Bunting, MD  spironolactone (ALDACTONE) 25 MG tablet Take 0.5 tablets (12.5 mg total) by mouth daily. 04/13/18   Lewayne Bunting, MD  Vitamin D, Ergocalciferol, (DRISDOL) 50000 UNITS CAPS Take 50,000 Units by mouth 2 (two) times a week. Monday and Thursday    [provider]    Family History Family History  Problem Relation Age of Onset  . Heart attack Mother   . Heart attack Father     Social History Social History   Tobacco Use  . Smoking status: Former Smoker    Packs/day: 3.00    Years: 25.00    Pack years: 75.00    Last attempt to quit: 2005    Years since quitting: 14.9  . Smokeless  tobacco: Never Used  . Tobacco comment: quit smoking in 2005  Substance Use Topics  . Alcohol use: No  . Drug use: No     Allergies   Codeine and Clopidogrel bisulfate   Review of Systems Review of Systems  Unable to perform ROS: Intubated     Physical Exam Updated Vital Signs BP (!) 72/56 (BP Location: Right Arm)   Pulse 100   SpO2 (!) 77%   Physical Exam  Constitutional: He appears well-developed and well-nourished.  HENT:  Head: Normocephalic and atraumatic.  Patient orally intubated with blood from the ET tube.  Eyes:  Pulse of 2 mm nonresponsive.  Conjunctiva normal.  Neck:  Trach midline no crepitus.  Cardiovascular:  Distant heart sounds regular.  Pulmonary/Chest:  Coarse breath sounds letter on the right.  No chest wall crepitus.  Abdominal:  Soft obese without masses guarding or rebound.  Genitourinary:  Genitourinary Comments: Circumcised male  Musculoskeletal:  No obvious deformities weak radial pulses moderate femoral pulses  Neurological:  Patient is unresponsive.  He is having some spontaneous breaths.  No withdraw all 4 extremities from pain.  Skin:  Patient is cool and pale.     ED Treatments / Results  Labs (all labs ordered are listed, but only abnormal results are displayed) Labs Reviewed  COMPREHENSIVE METABOLIC PANEL - Abnormal; Notable for the following components:      Result Value   Potassium 2.2 (*)    Chloride 95 (*)    Glucose, Bld 398 (*)    Creatinine, Ser 1.99 (*)    Calcium 8.0 (*)    Albumin 2.8 (*)    AST 50 (*)    GFR calc non Af Amer 35 (*)    GFR calc Af Amer 41 (*)    All other components within normal limits  CBC WITH DIFFERENTIAL/PLATELET - Abnormal; Notable for the following components:   WBC 26.4 (*)    Neutro Abs 16.9 (*)    Lymphs Abs 7.9 (*)    Monocytes Absolute 1.1 (*)    All other components within normal limits  PROTIME-INR - Abnormal; Notable for the following components:   Prothrombin Time 17.5  (*)    All other components within normal limits  MAGNESIUM - Abnormal; Notable for the following components:   Magnesium 1.2 (*)    All other components within normal limits  BASIC METABOLIC PANEL - Abnormal; Notable for the following components:   Sodium 134 (*)    Potassium 3.1 (*)    Chloride 94 (*)    Glucose, Bld 499 (*)    Creatinine, Ser 1.93 (*)    Calcium 7.2 (*)    GFR calc non Af Amer 37 (*)    GFR calc Af Amer 43 (*)    All other components within normal limits  I-STAT VENOUS BLOOD GAS, ED - Abnormal; Notable for the following components:   pCO2, Ven 60.3 (*)    pO2, Ven 26.0 (*)    Bicarbonate 29.6 (*)    All other components within normal limits  I-STAT CG4 LACTIC ACID, ED - Abnormal; Notable for the following components:   Lactic Acid, Venous 7.70 (*)    All other components within normal limits  I-STAT CHEM 8, ED - Abnormal; Notable for the following components:   Sodium 134 (*)    Chloride 95 (*)    Creatinine, Ser 1.70 (*)    Glucose, Bld 375 (*)    Calcium, Ion 0.87 (*)    TCO2 34 (*)    All other components within normal limits  I-STAT CG4 LACTIC ACID, ED - Abnormal; Notable for the following components:   Lactic Acid, Venous 4.78 (*)    All other components within normal limits  I-STAT ARTERIAL BLOOD GAS, ED - Abnormal; Notable for the following components:   pH, Arterial 7.329 (*)    pCO2 arterial 58.7 (*)    pO2, Arterial 49.0 (*)    Bicarbonate 30.9 (*)    TCO2 33 (*)    Acid-Base Excess 3.0 (*)    All other components within normal limits  CULTURE, BLOOD (ROUTINE X 2)  CULTURE, BLOOD (ROUTINE X 2)  CULTURE, BLOOD (ROUTINE X 2)  CULTURE, BLOOD (ROUTINE X 2)  CULTURE, RESPIRATORY  MRSA PCR SCREENING  LIPASE, BLOOD  HIV ANTIBODY (ROUTINE TESTING W REFLEX)  URINALYSIS, ROUTINE W REFLEX MICROSCOPIC  CBC  BLOOD GAS, ARTERIAL  CBC  BASIC METABOLIC PANEL  BLOOD GAS, ARTERIAL  MAGNESIUM  PHOSPHORUS  BLOOD GAS, ARTERIAL  I-STAT TROPONIN, ED    POCT I-STAT 3, ART BLOOD GAS (G3+)  TYPE AND SCREEN  ABO/RH    EKG EKG Interpretation  Date/Time:  Tuesday Jul 02, 2018 12:57:16 EST Ventricular Rate:  89 PR Interval:    QRS Duration: 88 QT Interval:  336 QTC Calculation: 409 R Axis:   22 Text Interpretation:  Sinus rhythm Prolonged PR interval Low voltage, extremity and precordial leads Abnormal R-wave progression, early transition Nonspecific repol abnormality, lateral leads compared with prior 9/18 Confirmed by Meridee Score [phone]) on 2018/07/02 1:05:06 PM   Radiology Ct Head Wo Contrast  Result Date: 02-Jul-2018 CLINICAL DATA:  Altered level of consciousness. EXAM: CT HEAD WITHOUT CONTRAST TECHNIQUE: Contiguous axial images were obtained from the base of the skull through the vertex without intravenous contrast. COMPARISON:  None. FINDINGS: Brain: Mild atrophy. Negative for hydrocephalus. Negative for acute infarct, hemorrhage, mass Vascular: Negative for hyperdense vessel Skull: Negative Sinuses/Orbits: Patient is intubated. Paranasal sinuses clear. Normal orbit. Other: None IMPRESSION: No acute intracranial abnormality. Electronically Signed   By: Marlan Palau M.D.   On: 07-02-2018 17:22   Ct Chest Wo Contrast  Result Date: Jul 02, 2018 CLINICAL DATA:  62 y/o  M; pulseless collapse with apnea post CPR. EXAM: CT CHEST WITHOUT CONTRAST TECHNIQUE: Multidetector CT imaging of the chest was performed following the standard protocol without IV contrast. COMPARISON:  04/03/2017 CT chest. Jul 02, 2018 chest radiograph. FINDINGS: Cardiovascular: Normal heart size. No pericardial effusion. Normal caliber thoracic aorta and main pulmonary artery. Coronary artery calcific atherosclerosis with RCA and LAD stents. Mild aortic calcific atherosclerosis. Left central venous catheter tip extends to upper SVC. Mediastinum/Nodes: No enlarged mediastinal or axillary lymph nodes. Thyroid gland, trachea, and esophagus demonstrate no significant  findings. Lungs/Pleura: Moderate bilateral pleural effusions. Diffuse consolidation greatest in right upper and left lower lobes. No pneumothorax. Upper Abdomen: Enteric tube tip extends to the gastric body. Musculoskeletal: Left 2-7 acute nondisplaced mildly angulated left anterior rib fractures. 5-7 mildly displaced acute right lateral rib fracture. IMPRESSION: 1. Diffuse pulmonary consolidation greatest in right upper lobe and left lower lobe may represent multifocal pneumonia, pulmonary edema, or ARDS. 2. Moderate bilateral pleural effusions. 3. Left 2-7 nondisplaced mildly angulated left anterior rib fractures. 5-7 mildly displaced acute right lateral rib fracture. 4. Coronary artery and aortic calcific Electronically Signed   By: Mitzi Hansen M.D.   On: July 02, 2018 17:31   Dg Chest Port 1 View  Result Date: 07/02/2018 CLINICAL DATA:  Central catheter placement.  Hypoxia. EXAM: PORTABLE CHEST 1 VIEW COMPARISON:  Jul 02, 2018 study obtained earlier in the day FINDINGS: Endotracheal tube tip is 4.9 cm above the carina. Nasogastric tube tip and side port are below the diaphragm. Central catheter tip is in the superior vena cava. No pneumothorax. There is widespread airspace consolidation throughout much of the right lung, stable. There is interstitial edema with perihilar interstitial edema. There is cardiomegaly with pulmonary venous hypertension. No adenopathy. No bone lesions. IMPRESSION: Tube and catheter positions as described without pneumothorax. Pulmonary vascular congestion with perihilar interstitial edema. Extensive airspace consolidation, likely pneumonia, throughout much of the right lung. Electronically Signed   By: Bretta Bang  III M.D.   On: July 04, 2018 15:47   Dg Chest Port 1 View  Result Date: 07-04-18 CLINICAL DATA:  Post arrest. Status post endotracheal and esophagogastric tube placement. EXAM: PORTABLE CHEST 1 VIEW COMPARISON:  PA and lateral chest x-ray dated  Dec 16, 2017 FINDINGS: The endotracheal tube tip lies at the inferior margin of the clavicular heads approximately 4 cm above the carina. The esophagogastric tube tip and proximal port project below the expected location of the GE junction. There is confluent airspace opacity throughout much of the right lung which is new. The left lung exhibits mildly increased interstitial markings slightly more conspicuous overall. The pulmonary vascularity is engorged and indistinct. The cardiac silhouette remains enlarged. External pacemaker defibrillator pads are present. There are fractures of the lateral aspects of the right fifth through seventh ribs. IMPRESSION: Confluent airspace opacity throughout much of the right lung consistent with asymmetric pulmonary edema or other alveolar filling process. Coarse interstitial markings throughout the left lung without alveolar infiltrates. Mild cardiomegaly with pulmonary vascular congestion. Displaced fractures of the posterolateral aspects of the right fifth, sixth, and seventh ribs. No pneumothorax. The support tubes are in reasonable position. Electronically Signed   By: David  [place] M.D.   On: July 04, 2018 13:28    Procedures .Critical Care Performed by: Terrilee Files, MD Authorized by: Terrilee Files, MD   Critical care provider statement:    Critical care time (minutes):  60   Critical care time was exclusive of:  Separately billable procedures and treating other patients   Critical care was necessary to treat or prevent imminent or life-threatening deterioration of the following conditions:  Cardiac failure, circulatory failure, CNS failure or compromise, sepsis, shock and renal failure   Critical care was time spent personally by me on the following activities:  Discussions with consultants, evaluation of patient's response to treatment, examination of patient, ordering and performing treatments and interventions, ordering and review of laboratory  studies, ordering and review of radiographic studies, pulse oximetry, re-evaluation of patient's condition, obtaining history from patient or surrogate, review of old charts, development of treatment plan with patient or surrogate and ventilator management   I assumed direction of critical care for this patient from another provider in my specialty: no     (including critical care time)  Medications Ordered in ED Medications  calcium gluconate 1 g in sodium chloride 0.9 % 100 mL IVPB (has no administration in time range)  sodium chloride 0.9 % bolus 1,000 mL (has no administration in time range)  0.9 %  sodium chloride infusion (has no administration in time range)  0.9 %  sodium chloride infusion (has no administration in time range)  norepinephrine (LEVOPHED) 4mg  in D5W premix infusion (has no administration in time range)  piperacillin-tazobactam (ZOSYN) IVPB 3.375 g (has no administration in time range)     Initial Impression / Assessment and Plan / ED Course  I have reviewed the triage vital signs and the nursing notes.  Pertinent labs & imaging results that were available during my care of the patient were reviewed by me and considered in my medical decision making (see chart for details).  Clinical Course as of Jun 23 1945  Tue 07/04/2018  1304 Discussed with icu - recommend no cooling due to blood in et tube and eliquis   [MB]  1326 Patient's chest x-ray briefly read by me prior to her radiology interpretation.  He is got a white out on the right side.  Aborted antibiotics.  Pressure is dropping and Levophed drip ordered.   [MB]  1327 Was informed to the secretary that the wife is on her way in.   [MB]    Clinical Course User Index [MB] Terrilee Files, MD     Final Clinical Impressions(s) / ED Diagnoses   Final diagnoses:  Cardiac arrest [facility]  Hypokalemia  Community acquired pneumonia of right lung, unspecified part of lung    ED Discharge Orders     None       Terrilee Files, MD 07-12-2018 1948

## 2018-06-23 NOTE — Progress Notes (Deleted)
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 Nuclear study 11/16 showed EF 43, prior anterior apical infarct and no ischemia.  Last echocardiogram August 2018 showed ejection fraction 35 to 40%, grade 2 diastolic dysfunction and mild tricuspid regurgitation. Lung disease/nodule followed by pulmonary.  Pt also with PAF.  Patient tried on Tikosyn but QT lengthened.  He was therefore placed on amiodarone.  Since last seen,   Current Outpatient Medications  Medication Sig Dispense Refill  . amiodarone (PACERONE) 200 MG tablet Take 1 tablet (200 mg total) by mouth daily. 90 tablet 3  . apixaban (ELIQUIS) 5 MG TABS tablet Take 1 tablet (5 mg total) by mouth 2 (two) times daily. 180 tablet 3  . ENTRESTO 24-26 MG TAKE  (1)  TABLET TWICE A DAY. 60 tablet 5  . fluticasone furoate-vilanterol (BREO ELLIPTA) 200-25 MCG/INH AEPB Inhale 1 puff into the lungs daily. 60 each 5  . furosemide (LASIX) 40 MG tablet Take 40 mg by mouth daily. Can take additional 40mg  as needed for weight gain of 2-3lbs    . furosemide (LASIX) 40 MG tablet TAKE 1 TABLET DAILY 90 tablet 1  . insulin lispro (HUMALOG) 100 UNIT/ML injection Inject 7-17 Units into the skin 3 (three) times daily before meals. Sliding scale    . insulin lispro protamine-lispro (HUMALOG 75/25 MIX) (75-25) 100 UNIT/ML SUSP injection Inject 50 Units into the skin 2 (two) times daily with a meal.     . metFORMIN (GLUCOPHAGE) 500 MG tablet Take 500 mg by mouth 2 (two) times daily with a meal.     . metoprolol succinate (TOPROL-XL) 50 MG 24 hr tablet Take 3 tablets (150 mg total) by mouth as directed. Take with or immediately following a meal. 270 tablet 0  . nitroGLYCERIN (NITROSTAT) 0.4 MG SL tablet Place 1 tablet (0.4 mg total) under the tongue every 5 (five) minutes as needed. 25 tablet 5  . nitroGLYCERIN (NITROSTAT) 0.4 MG SL tablet PLACE 1 TABLET UNDER THE TONGUE AT  ONSET OF CHEST PAIN EVERY 5 MINTUES UP TO 3 TIMES AS NEEDED 25 tablet 7  . rosuvastatin (CRESTOR) 40 MG tablet Take 1 tablet (40 mg total) by mouth daily. TAKE 1 TABLET (40mg ) BY MOUTH DAILY 90 tablet 0  . spironolactone (ALDACTONE) 25 MG tablet Take 0.5 tablets (12.5 mg total) by mouth daily. 90 tablet 1  . Vitamin D, Ergocalciferol, (DRISDOL) 50000 UNITS CAPS Take 50,000 Units by mouth 2 (two) times a week. Monday and Thursday     No current facility-administered medications for this visit.      Past Medical History:  Diagnosis Date  . Arthritis    " In my knees   . Atrial fibrillation (HCC) 02/2017  . CAD (coronary artery disease)   . CHF (congestive heart failure) (HCC)   . DM (diabetes mellitus) (HCC)    insulin dependent  . GERD (gastroesophageal reflux disease)   . HLD (hyperlipidemia)   . HTN (hypertension)   . MI (myocardial infarction) (HCC)   . Obesity   . Sleep apnea     Past Surgical History:  Procedure Laterality Date  . CARDIAC CATHETERIZATION     with coronary angiography   . CARDIOVERSION N/A 05/05/2015   Procedure: CARDIOVERSION;  Surgeon: Vesta MixerPhilip J Nahser, MD;  [facility];  Service: Cardiovascular;  Laterality: N/A;  . CARDIOVERSION N/A 02/14/2017   Procedure: CARDIOVERSION;  Surgeon: Elease Hashimoto Deloris Ping, MD;  Location: [facility] ENDOSCOPY;  Service: Cardiovascular;  Laterality: N/A;  . CORONARY STENT PLACEMENT     eluting stent placement in the culpric lesion of the LAD  . TEE WITHOUT CARDIOVERSION N/A 05/05/2015   Procedure: TRANSESOPHAGEAL ECHOCARDIOGRAM (TEE);  Surgeon: Vesta Mixer, MD;  Location: [facility] ENDOSCOPY;  Service: Cardiovascular;  Laterality: N/A;    Social History   Socioeconomic History  . Marital status: Married    Spouse name: Not on file  . Number of children: Not on file  . Years of education: Not on file  . Highest education level: Not on file  Occupational History  . Not on file  Social Needs  . Financial resource strain: Not  on file  . Food insecurity:    Worry: Not on file    Inability: Not on file  . Transportation needs:    Medical: Not on file    Non-medical: Not on file  Tobacco Use  . Smoking status: Former Smoker    Packs/day: 3.00    Years: 25.00    Pack years: 75.00    Last attempt to quit: 2005    Years since quitting: 14.9  . Smokeless tobacco: Never Used  . Tobacco comment: quit smoking in 2005  Substance and Sexual Activity  . Alcohol use: No  . Drug use: No  . Sexual activity: Not on file  Lifestyle  . Physical activity:    Days per week: Not on file    Minutes per session: Not on file  . Stress: Not on file  Relationships  . Social connections:    Talks on phone: Not on file    Gets together: Not on file    Attends religious service: Not on file    Active member of club or organization: Not on file    Attends meetings of clubs or organizations: Not on file    Relationship status: Not on file  . Intimate partner violence:    Fear of current or ex partner: Not on file    Emotionally abused: Not on file    Physically abused: Not on file    Forced sexual activity: Not on file  Other Topics Concern  . Not on file  Social History Narrative  . Not on file    Family History  Problem Relation Age of Onset  . Heart attack Mother   . Heart attack Father     ROS: no fevers or chills, productive cough, hemoptysis, dysphasia, odynophagia, melena, hematochezia, dysuria, hematuria, rash, seizure activity, orthopnea, PND, pedal edema, claudication. Remaining systems are negative.  Physical Exam: Well-developed well-nourished in no acute distress.  Skin is warm and dry.  HEENT is normal.  Neck is supple.  Chest is clear to auscultation with normal expansion.  Cardiovascular exam is regular rate and rhythm.  Abdominal exam nontender or distended. No masses palpated. Extremities show no edema. neuro grossly intact  ECG- personally reviewed  A/P  1  Olga Millers,  MD

## 2018-06-23 NOTE — Progress Notes (Signed)
Pharmacy Antibiotic Note  Lawrence GearingJoseph R Delgado is a 62 y.o. male admitted on 11-18-2017 with pneumonia.  Pharmacy has been consulted for unasyn dosing. Pt is afebrile and WBC is elevated a 26.4. SCr is elevated at 1.93  Plan: Unasyn 3gm IV Q6H F/u renal fxn, C&S, clinical status   Height: 6\' 1"  (185.4 cm) Weight: 275 lb 12.7 oz (125.1 kg) IBW/kg (Calculated) : 79.9  No data recorded.  Recent Labs  Lab 2018-05-14 1259 2018-05-14 1301 2018-05-14 1305  WBC 26.4*  --   --   CREATININE 1.99* 1.70*  --   LATICACIDVEN  --   --  7.70*    Estimated Creatinine Clearance: 64.1 mL/min (A) (by C-G formula based on SCr of 1.7 mg/dL (H)).    Allergies  Allergen Reactions  . Codeine Nausea And Vomiting  . Clopidogrel Bisulfate Itching and Rash    Antimicrobials this admission: Unasyn 11/26>> Zosyn x 1 11/26  Dose adjustments this admission: N/A  Microbiology results: Pending  Thank you for allowing pharmacy to be a part of this patient's care.  Lawrence Delgado, Lawrence LeachRachel Delgado 11-18-2017 3:09 PM

## 2018-06-23 NOTE — ED Notes (Addendum)
Waiting on RT for transport to CT and 2H

## 2018-06-23 NOTE — Progress Notes (Signed)
Patient was transported to CT & then to 2H09 without any complications.

## 2018-06-23 NOTE — Procedures (Signed)
Arterial Catheter Insertion Procedure Note Lawrence GearingJoseph R Delgado [id number] [birth year]  Procedure: Insertion of Arterial Catheter  Indications: Blood pressure monitoring and Frequent blood sampling  Procedure Details Consent: Unable to obtain consent because of emergent medical necessity. Time Out: Verified patient identification, verified procedure, site/side was marked, verified correct patient position, special equipment/implants available, medications/allergies/relevent history reviewed, required imaging and test results available.  Performed  Maximum sterile technique was used including antiseptics, cap, gloves, gown, hand hygiene, mask and sheet. Skin prep: Chlorhexidine; local anesthetic administered 20 gauge catheter was inserted into right radial artery using the Seldinger technique. ULTRASOUND GUIDANCE USED: NO Evaluation Blood flow good; BP tracing poor. Complications: No apparent complications.   Lawrence Delgado, Lawrence Delgado 06/04/2018

## 2018-06-23 NOTE — ED Notes (Signed)
Vasopressin gtt verified by Manuela Schwartzuth RN

## 2018-06-23 NOTE — ED Triage Notes (Signed)
Patient [facility] to ed [facility]  States patient was in his pcp office and collapsed patient became apneic and pulseless, cpr was started by office staff, ems states they had done cpr for approx. 30mins. Upon ems arrival patient was in v-fib and shocked x 1 . Patient has been in and out of ROSC. Marland Kitchen. Patient was intubated per ems 7.0 epip x 3 CBG 323.  1243 pulse check no pulse cpr per lucas 1249 pulse check positive pulse . Rate 100, b/p 72./56,

## 2018-06-23 NOTE — ED Notes (Signed)
CCM in to examine- seaking with family regarding central line placement

## 2018-06-23 NOTE — ED Notes (Signed)
Pt care assumed, bedside report obtained.  Critical Team is at bedside.  Central Line is being placed by Maralyn SagoSarah NP.

## 2018-06-23 NOTE — Progress Notes (Signed)
Clarified with Molli KnockYacoub MD that we will be replacing electrolytes per orders given.

## 2018-06-23 NOTE — H&P (Addendum)
NAME:  Lawrence Delgado, MRN:  [id number], DOB:  Jun 24, 1956, LOS: 0 ADMISSION DATE:  2018/06/25, CONSULTATION DATE:  Jun 25, 2018 REFERRING MD:  Dr. Charm Barges, CHIEF COMPLAINT:  Cardiac Arrest    Brief History   62 y/o M who presented to [facility] on 11/26 after suffering a cardiac arrest.    History of present illness   62 y/o M who presented to [facility] on 11/26 after suffering a cardiac arrest.    The patient was apparently at his PCP office in [facility] for blood work when he collapsed and was pulseless / apneic.  CPR was initiated by office staff.  CPR was completed for approximately 30 minutes.  On EMS arrival, he was in VF and required shock x1.  He had multiple arrests mixed with ROSC.  The patient was placed on the Chelsea device.  He was intubated per EMS.  He was given 3L total IVF.   While in ER, he had further PEA arrest.  The patient was stabilized in the ER.  Initial evaluation notable for CXR with dense right sided airspace disease.  Bloody secretions were noted from ETT.  ABG 7.329 / 58 / 49 / 30.  Na 134, K 5.1 (one drawn 4 minutes before as 2.2, repeat pending), Cl 95, glucose 375, BUN 16, Sr 1.70, troponin 0.07, lactic acid 7.70, WBC 26.4, Hgb 15.1, and platelets 312.    PCCM called for ICU admission.   Past Medical History  OSA Obesity  MI, x5 stents HTN CHF CAD HLD AF on Eliquis GERD DM   Significant Hospital Events   11/26 Admit   Consults:    Procedures:  ETT 11/26 >>  L IJ TLC 11/26 >>   Significant Diagnostic Tests:  11/26  Admit after cardiac arrest  Micro Data:  BCx2 11/26 >>  Tracheal Aspirate 11/26 >>   Antimicrobials:  Unasyn 11/26 >>    Interim history/subjective:  As above  Objective   Blood pressure (!) 87/66, pulse (!) 56, resp. rate 19, height 6\' 1"  (1.854 m), SpO2 (!) 48 %.        Intake/Output Summary (Last 24 hours) at 2018-06-25 1426 Last data filed at 06/25/18 1413 Gross per 24 hour  Intake 100 ml  Output -  Net 100 ml   There  were no vitals filed for this visit.  Examination: General: chronically ill appearing male lying in bed on vent HEENT: MM pink/moist, ETT Neuro: obtunded on vent CV: s1s2 rrr, no m/r/g PULM: even/non-labored, lungs bilaterally coarse with rhonchi  WU:JWJX, non-tender, bsx4 active  Extremities: warm/dry, no edema  Skin: no rashes or lesions.  Flaky / dry skin with scaling  Resolved Hospital Problem list      Assessment & Plan:   Cardiac Arrest  -CPR performed by bystanders, approximately 30+ minutes total CPR P: Admit to ICU  Maintain normothermia / avoid fever  Not a cooling candidate due to prolonged down time and bleeding on admit Assess ECHO  Assess blood cultures  May need Cardiology input once stabilized    Shock -suspect cardiogenic, r/o septic, blood loss  P: Levophed for MAP >65 Vasopressin  Trend CBC, r/o bleed.  Follow up at 8pm   Acute Hypoxemic Respiratory Failure  -post arrest, admit CXR with dense right sided airspace disease  P: PRVC 8cc/kg  Wean PEEP/FiO2 for sats >90% Follow CXR Empiric aspiration coverage with unasyn  SBT/WUA when mental status permits  Culture sputum  FOB once lines placed / stabilized  Assess  non-contrast CT Chest given infiltrate / concern for bleeding   AKI -suspect pre-renal in setting of arrest, possible  P: Trend BMP / urinary output Replace electrolytes as indicated Avoid nephrotoxic agents, ensure adequate renal perfusion   Hypo/Hyperkalemia  P: ? Lab error, repeat K now  Replace as indicated    Acute Metabolic Encephalopathy  -post arrest  P: Minimize sedating medications  PRN PAD protocol  CT head to r/o bleed   AF  -rate controlled on admit  -on Eliquis at baseline, bleeding on admit  P: Tele monitoring  Assess EKG Hold home Eliquis with concern for bleeding   GERD P: Pepcid  Consider TF in am 11/27   HTN, HLD, CAD s/p stent, CHF  P: Hold home medications    DM  P: SSI,  moderate scale    Best practice:  Diet: NPO Pain/Anxiety/Delirium protocol (if indicated): PRN fentanyl / versed  VAP protocol (if indicated): ordered  DVT prophylaxis: SCD's.  May need to consider heparin 11/27 if no bleeding GI prophylaxis: Pepcid  Glucose control: SSI  Mobility: bedrest  Code Status: Limited code Family Communication: Wife, mother, brother updated per Dr. Molli Knock 11/26.  Disposition: ICU  Labs   CBC: Recent Labs  Lab 06/09/2018 1259 06/12/2018 1301  WBC 26.4*  --   NEUTROABS PENDING  --   HGB 15.1 15.0  HCT 47.7 44.0  MCV 93.2  --   PLT 312  --     Basic Metabolic Panel: Recent Labs  Lab 06/12/2018 1259 06/08/2018 1301  NA 138 134*  K 2.2* 5.1  CL 95* 95*  CO2 30  --   GLUCOSE 398* 375*  BUN 11 16  CREATININE 1.99* 1.70*  CALCIUM 8.0*  --    GFR: CrCl cannot be calculated (Unknown ideal weight.). Recent Labs  Lab 06/18/2018 1259 06/25/2018 1305  WBC 26.4*  --   LATICACIDVEN  --  7.70*    Liver Function Tests: Recent Labs  Lab 06/13/2018 1259  AST 50*  ALT 18  ALKPHOS 54  BILITOT 0.9  PROT 6.8  ALBUMIN 2.8*   Recent Labs  Lab 06/26/2018 1259  LIPASE 33   No results for input(s): AMMONIA in the last 168 hours.  ABG    Component Value Date/Time   TCO2 34 (H) 06/18/2018 1301     Coagulation Profile: Recent Labs  Lab 06/19/2018 1259  INR 1.45    Cardiac Enzymes: No results for input(s): CKTOTAL, CKMB, CKMBINDEX, TROPONINI in the last 168 hours.  HbA1C: Hgb A1c MFr Bld  Date/Time Value Ref Range Status  03/04/2017 02:01 PM 10.7 (H) 4.8 - 5.6 % Final    Comment:    (NOTE)         Pre-diabetes: 5.7 - 6.4         Diabetes: >6.4         Glycemic control for adults with diabetes: <7.0   05/02/2015 08:51 PM 10.2 (H) 4.8 - 5.6 % Final    Comment:    (NOTE)         Pre-diabetes: 5.7 - 6.4         Diabetes: >6.4         Glycemic control for adults with diabetes: <7.0     CBG: No results for input(s): GLUCAP in the last 168  hours.  Review of Systems:   Unable to complete as patient is on ventilation.  Information obtained from staff and family.    Past Medical History  He,  has a past medical history of Arthritis, Atrial fibrillation (HCC) (02/2017), CAD (coronary artery disease), CHF (congestive heart failure) (HCC), DM (diabetes mellitus) (HCC), GERD (gastroesophageal reflux disease), HLD (hyperlipidemia), HTN (hypertension), MI (myocardial infarction) (HCC), Obesity, and Sleep apnea.   Surgical History    Past Surgical History:  Procedure Laterality Date  . CARDIAC CATHETERIZATION     with coronary angiography   . CARDIOVERSION N/A 05/05/2015   Procedure: CARDIOVERSION;  Surgeon: Vesta Mixer, MD;  Location: [facility] ENDOSCOPY;  Service: Cardiovascular;  Laterality: N/A;  . CARDIOVERSION N/A 02/14/2017   Procedure: CARDIOVERSION;  Surgeon: Vesta Mixer, MD;  Location: [facility] ENDOSCOPY;  Service: Cardiovascular;  Laterality: N/A;  . CORONARY STENT PLACEMENT     eluting stent placement in the culpric lesion of the LAD  . TEE WITHOUT CARDIOVERSION N/A 05/05/2015   Procedure: TRANSESOPHAGEAL ECHOCARDIOGRAM (TEE);  Surgeon: Vesta Mixer, MD;  Location: [facility] ENDOSCOPY;  Service: Cardiovascular;  Laterality: N/A;     Social History   reports that he quit smoking about 14 years ago. He has a 75.00 pack-year smoking history. He has never used smokeless tobacco. He reports that he does not drink alcohol or use drugs.   Family History   His family history includes Heart attack in his father and mother.   Allergies Allergies  Allergen Reactions  . Codeine     Liquid for caused REACTION: Nausea/vomiting  . Clopidogrel Bisulfate Itching and Rash     Home Medications  Prior to Admission medications   Medication Sig Start Date End Date Taking? Authorizing Provider  amiodarone (PACERONE) 200 MG tablet Take 1 tablet (200 mg total) by mouth daily. 08/15/17   Newman Nip, NP  apixaban (ELIQUIS) 5 MG TABS tablet  Take 1 tablet (5 mg total) by mouth 2 (two) times daily. 09/13/15   Crenshaw, Madolyn Frieze, MD  ENTRESTO 24-26 MG TAKE  (1)  TABLET TWICE A DAY. 06/22/18   Lewayne Bunting, MD  fluticasone furoate-vilanterol (BREO ELLIPTA) 200-25 MCG/INH AEPB Inhale 1 puff into the lungs daily. 02/05/18   Mannam, Colbert Coyer, MD  furosemide (LASIX) 40 MG tablet Take 40 mg by mouth daily. Can take additional 40mg  as needed for weight gain of 2-3lbs    [provider]  furosemide (LASIX) 40 MG tablet TAKE 1 TABLET DAILY 05/18/18   Lewayne Bunting, MD  insulin lispro (HUMALOG) 100 UNIT/ML injection Inject 7-17 Units into the skin 3 (three) times daily before meals. Sliding scale    [provider]  insulin lispro protamine-lispro (HUMALOG 75/25 MIX) (75-25) 100 UNIT/ML SUSP injection Inject 50 Units into the skin 2 (two) times daily with a meal.     [provider]  metFORMIN (GLUCOPHAGE) 500 MG tablet Take 500 mg by mouth 2 (two) times daily with a meal.     [provider]  metoprolol succinate (TOPROL-XL) 50 MG 24 hr tablet Take 3 tablets (150 mg total) by mouth as directed. Take with or immediately following a meal. 06/02/18   Crenshaw, Madolyn Frieze, MD  nitroGLYCERIN (NITROSTAT) 0.4 MG SL tablet Place 1 tablet (0.4 mg total) under the tongue every 5 (five) minutes as needed. 03/27/16   Lewayne Bunting, MD  nitroGLYCERIN (NITROSTAT) 0.4 MG SL tablet PLACE 1 TABLET UNDER THE TONGUE AT ONSET OF CHEST PAIN EVERY 5 MINTUES UP TO 3 TIMES AS NEEDED 11/27/17   Lewayne Bunting, MD  rosuvastatin (CRESTOR) 40 MG tablet Take 1 tablet (40 mg total) by mouth daily.  TAKE 1 TABLET (40mg ) BY MOUTH DAILY 02/18/18   Lewayne Buntingrenshaw, Brian S, MD  spironolactone (ALDACTONE) 25 MG tablet Take 0.5 tablets (12.5 mg total) by mouth daily. 04/13/18   Lewayne Buntingrenshaw, Brian S, MD  Vitamin D, Ergocalciferol, (DRISDOL) 50000 UNITS CAPS Take 50,000 Units by mouth 2 (two) times a week. Monday and Thursday    [provider]      Critical care time: 35 minutes     Canary BrimBrandi Ollis, NP-C [facility] Pgr: [phone] or if no answer [phone] 06/02/2018, 2:26 PM  Attending Note:  62 year old male with extensive cardiac PMH who presents with complete whiteout of the right lung and cardiac arrest.  Blood extracted from the ETT upon intubation but no hemoptysis prior to that.  Unsure if this is a primary cardiac event or a respiratory event.  Downtime is 40 minutes downtime, 30 in the field and 10 in the ER.  On exam, patient is completely unresponsive but not following commands, only has a respiratory drive and decreased BS on the right.  I reviewed CXR myself, ETT ok and diffuse right sided infiltrate.  Discussed with PCCM-NP.  Will consult cardiology.  Place IV access for pressors.  Pan culture.  Place on unasyn.  No hypothermia due to 40 minutes of CPR, right sided PNA and anti-coagulated on eliquis.  Will consider bronchoscopy if the hemoptysis restarts but hold off for now.  CT of the head ordered.  PCCM will admit to the ICU.  The patient is critically ill with multiple organ systems failure and requires high complexity decision making for assessment and support, frequent evaluation and titration of therapies, application of advanced monitoring technologies and extensive interpretation of multiple databases.   Critical Care Time devoted to patient care services described in this note is  40  Minutes. This time reflects time of care of this signee Dr Koren BoundWesam . This critical care time does not reflect procedure time, or teaching time or supervisory time of PA/NP/Med student/Med Resident etc but could involve care discussion time.  Alyson ReedyWesam G. , M.D. [facility]. Pager: [phone]. After hours pager: [phone].

## 2018-06-23 NOTE — Procedures (Signed)
Central Venous Catheter Insertion Procedure Note Marchelle GearingJoseph R Yoakum [id number] Feb 05, 1956  Procedure: Insertion of Central Venous Catheter Indications: Drug and/or fluid administration  Procedure Details Consent: Risks of procedure as well as the alternatives and risks of each were explained to the (patient/caregiver).  Consent for procedure obtained. Time Out: Verified patient identification, verified procedure, site/side was marked, verified correct patient position, special equipment/implants available, medications/allergies/relevent history reviewed, required imaging and test results available.  Performed  Maximum sterile technique was used including antiseptics, cap, gloves, gown, hand hygiene, mask and sheet. Skin prep: Chlorhexidine; local anesthetic administered A antimicrobial bonded/coated triple lumen catheter was placed in the left internal jugular vein using the Seldinger technique.  Evaluation Blood flow good Complications: No apparent complications Patient did tolerate procedure well. Chest X-ray ordered to verify placement.  CXR: pending   Insertion done with real time US guidance under the direct supervision of Dr. Molli KnockYacoub.  Bevelyn NgoSarah F [place] Yeats, AGACNP-BC [facility] CCM 06/02/2018, 3:37 PM

## 2018-06-23 NOTE — Progress Notes (Signed)
eLink Physician-Brief Progress Note Patient Name: Lawrence GearingJoseph R Delgado DOB: 04-Apr-1956 MRN: [id number]   Date of Service  06/09/2018  HPI/Events of Note  Hyperglycemia - Blood glucose = 375 -499 all day. Blood glucose now = 545.  eICU Interventions  Will order: 1. Insulin IV infusion per glucose stabilizer protocol.      Intervention Category Major Interventions: Hyperglycemia - active titration of insulin therapy  Lenell AntuSommer,Steven Eugene 06/19/2018, 9:03 PM

## 2018-06-24 ENCOUNTER — Inpatient Hospital Stay (HOSPITAL_COMMUNITY): Payer: Medicare Other

## 2018-06-24 ENCOUNTER — Encounter (HOSPITAL_COMMUNITY): Payer: Self-pay

## 2018-06-24 DIAGNOSIS — I959 Hypotension, unspecified: Secondary | ICD-10-CM

## 2018-06-24 DIAGNOSIS — J9601 Acute respiratory failure with hypoxia: Secondary | ICD-10-CM

## 2018-06-24 DIAGNOSIS — J189 Pneumonia, unspecified organism: Secondary | ICD-10-CM

## 2018-06-24 DIAGNOSIS — R001 Bradycardia, unspecified: Secondary | ICD-10-CM

## 2018-06-24 DIAGNOSIS — I469 Cardiac arrest, cause unspecified: Secondary | ICD-10-CM

## 2018-06-24 DIAGNOSIS — R579 Shock, unspecified: Secondary | ICD-10-CM

## 2018-06-24 LAB — GLUCOSE, CAPILLARY
GLUCOSE-CAPILLARY: 236 mg/dL — AB (ref 70–99)
GLUCOSE-CAPILLARY: 163 mg/dL — AB (ref 70–99)
GLUCOSE-CAPILLARY: 141 mg/dL — AB (ref 70–99)
GLUCOSE-CAPILLARY: 102 mg/dL — AB (ref 70–99)
GLUCOSE-CAPILLARY: 125 mg/dL — AB (ref 70–99)
GLUCOSE-CAPILLARY: 181 mg/dL — AB (ref 70–99)
GLUCOSE-CAPILLARY: 198 mg/dL — AB (ref 70–99)
Glucose-Capillary: 480 mg/dL — ABNORMAL HIGH (ref 70–99)
Glucose-Capillary: 452 mg/dL — ABNORMAL HIGH (ref 70–99)
Glucose-Capillary: 386 mg/dL — ABNORMAL HIGH (ref 70–99)
Glucose-Capillary: 319 mg/dL — ABNORMAL HIGH (ref 70–99)
Glucose-Capillary: 291 mg/dL — ABNORMAL HIGH (ref 70–99)
Glucose-Capillary: 223 mg/dL — ABNORMAL HIGH (ref 70–99)
Glucose-Capillary: 200 mg/dL — ABNORMAL HIGH (ref 70–99)
Glucose-Capillary: 198 mg/dL — ABNORMAL HIGH (ref 70–99)
Glucose-Capillary: 160 mg/dL — ABNORMAL HIGH (ref 70–99)
Glucose-Capillary: 105 mg/dL — ABNORMAL HIGH (ref 70–99)
Glucose-Capillary: 110 mg/dL — ABNORMAL HIGH (ref 70–99)
Glucose-Capillary: 111 mg/dL — ABNORMAL HIGH (ref 70–99)
Glucose-Capillary: 202 mg/dL — ABNORMAL HIGH (ref 70–99)
Glucose-Capillary: 194 mg/dL — ABNORMAL HIGH (ref 70–99)
Glucose-Capillary: 208 mg/dL — ABNORMAL HIGH (ref 70–99)

## 2018-06-24 LAB — BASIC METABOLIC PANEL
ANION GAP: 12 (ref 5–15)
ANION GAP: 11 (ref 5–15)
Anion gap: 13 (ref 5–15)
BUN: 20 mg/dL (ref 6–20)
BUN: 22 mg/dL — ABNORMAL HIGH (ref 6–20)
BUN: 21 mg/dL — ABNORMAL HIGH (ref 6–20)
CALCIUM: 7.6 mg/dL — AB (ref 8.9–10.3)
CALCIUM: 7.3 mg/dL — AB (ref 8.9–10.3)
CHLORIDE: 99 mmol/L (ref 98–111)
CO2: 22 mmol/L (ref 22–32)
CO2: 21 mmol/L — ABNORMAL LOW (ref 22–32)
CO2: 24 mmol/L (ref 22–32)
CREATININE: 2.76 mg/dL — AB (ref 0.61–1.24)
Calcium: 7.4 mg/dL — ABNORMAL LOW (ref 8.9–10.3)
Chloride: 100 mmol/L (ref 98–111)
Chloride: 100 mmol/L (ref 98–111)
Creatinine, Ser: 2.85 mg/dL — ABNORMAL HIGH (ref 0.61–1.24)
Creatinine, Ser: 2.91 mg/dL — ABNORMAL HIGH (ref 0.61–1.24)
GFR calc Af Amer: 27 mL/min — ABNORMAL LOW (ref >60)
GFR calc non Af Amer: 24 mL/min — ABNORMAL LOW (ref >60)
GFR, EST AFRICAN AMERICAN: 28 mL/min — AB (ref >60)
GFR, EST AFRICAN AMERICAN: 26 mL/min — AB (ref >60)
GFR, EST NON AFRICAN AMERICAN: 23 mL/min — AB (ref >60)
GFR, EST NON AFRICAN AMERICAN: 22 mL/min — AB (ref >60)
GLUCOSE: 346 mg/dL — AB (ref 70–99)
Glucose, Bld: 151 mg/dL — ABNORMAL HIGH (ref 70–99)
Glucose, Bld: 110 mg/dL — ABNORMAL HIGH (ref 70–99)
POTASSIUM: 2.8 mmol/L — AB (ref 3.5–5.1)
Potassium: 3 mmol/L — ABNORMAL LOW (ref 3.5–5.1)
Potassium: 3 mmol/L — ABNORMAL LOW (ref 3.5–5.1)
SODIUM: 132 mmol/L — AB (ref 135–145)
Sodium: 135 mmol/L (ref 135–145)
Sodium: 135 mmol/L (ref 135–145)

## 2018-06-24 LAB — CBC
HEMATOCRIT: 38 % — AB (ref 39.0–52.0)
Hemoglobin: 13.2 g/dL (ref 13.0–17.0)
MCH: 30 pg (ref 26.0–34.0)
MCHC: 34.7 g/dL (ref 30.0–36.0)
MCV: 86.4 fL (ref 80.0–100.0)
PLATELETS: 230 10*3/uL (ref 150–400)
RBC: 4.4 MIL/uL (ref 4.22–5.81)
RDW: 12.9 % (ref 11.5–15.5)
WBC: 18.3 10*3/uL — ABNORMAL HIGH (ref 4.0–10.5)
nRBC: 0 % (ref 0.0–0.2)

## 2018-06-24 LAB — POCT I-STAT 3, ART BLOOD GAS (G3+)
Acid-Base Excess: 2 mmol/L (ref 0.0–2.0)
Bicarbonate: 24.5 mmol/L (ref 20.0–28.0)
O2 Saturation: 98 %
PO2 ART: 92 mmHg (ref 83.0–108.0)
Patient temperature: 98.6
TCO2: 26 mmol/L (ref 22–32)
pCO2 arterial: 32.4 mmHg (ref 32.0–48.0)
pH, Arterial: 7.487 — ABNORMAL HIGH (ref 7.350–7.450)

## 2018-06-24 LAB — MAGNESIUM
MAGNESIUM: 1.6 mg/dL — AB (ref 1.7–2.4)
Magnesium: 2.2 mg/dL (ref 1.7–2.4)

## 2018-06-24 LAB — HIV ANTIBODY (ROUTINE TESTING W REFLEX): HIV SCREEN 4TH GENERATION: NONREACTIVE

## 2018-06-24 LAB — ECHOCARDIOGRAM COMPLETE
Height: 73 in
WEIGHTICAEL: 4786.63 [oz_av]

## 2018-06-24 LAB — CORTISOL: Cortisol, Plasma: 40.1 ug/dL

## 2018-06-24 LAB — TROPONIN I: TROPONIN I: 1.51 ng/mL — AB (ref <0.03)

## 2018-06-24 LAB — PHOSPHORUS
PHOSPHORUS: 2 mg/dL — AB (ref 2.5–4.6)
Phosphorus: <1 mg/dL — CL (ref 2.5–4.6)

## 2018-06-24 MED ORDER — PRO-STAT SUGAR FREE PO LIQD
60.0000 mL | Freq: Two times a day (BID) | ORAL | Status: DC
  Administered 2018-06-24 – 2018-06-29 (×11): 60 mL
  Filled 2018-06-24 (×11): qty 60

## 2018-06-24 MED ORDER — AMIODARONE HCL IN DEXTROSE 360-4.14 MG/200ML-% IV SOLN
30.0000 mg/h | INTRAVENOUS | Status: DC
  Administered 2018-06-24 – 2018-06-28 (×8): 30 mg/h via INTRAVENOUS
  Filled 2018-06-24 (×10): qty 200

## 2018-06-24 MED ORDER — AMIODARONE HCL IN DEXTROSE 360-4.14 MG/200ML-% IV SOLN
60.0000 mg/h | INTRAVENOUS | Status: AC
  Administered 2018-06-24: 60 mg/h via INTRAVENOUS
  Filled 2018-06-24 (×2): qty 200

## 2018-06-24 MED ORDER — VITAL HIGH PROTEIN PO LIQD
1000.0000 mL | ORAL | Status: DC
  Administered 2018-06-24 – 2018-06-28 (×5): 1000 mL

## 2018-06-24 MED ORDER — POTASSIUM CHLORIDE 20 MEQ/15ML (10%) PO SOLN
40.0000 meq | Freq: Once | ORAL | Status: AC
  Administered 2018-06-24: 40 meq via ORAL
  Filled 2018-06-24: qty 30

## 2018-06-24 MED ORDER — POTASSIUM CHLORIDE 20 MEQ/15ML (10%) PO SOLN
40.0000 meq | Freq: Once | ORAL | Status: AC
  Administered 2018-06-24: 40 meq
  Filled 2018-06-24: qty 30

## 2018-06-24 MED ORDER — SODIUM CHLORIDE 0.9 % IV SOLN
0.0000 ug/min | INTRAVENOUS | Status: DC
  Administered 2018-06-24: 100 ug/min via INTRAVENOUS
  Filled 2018-06-24 (×2): qty 4
  Filled 2018-06-24: qty 40

## 2018-06-24 MED ORDER — PERFLUTREN LIPID MICROSPHERE
1.0000 mL | INTRAVENOUS | Status: AC | PRN
  Administered 2018-06-24: 5 mL via INTRAVENOUS
  Filled 2018-06-24: qty 10

## 2018-06-24 MED ORDER — HYDROCORTISONE NA SUCCINATE PF 100 MG IJ SOLR
50.0000 mg | Freq: Four times a day (QID) | INTRAMUSCULAR | Status: DC
  Administered 2018-06-24 – 2018-06-25 (×5): 50 mg via INTRAVENOUS
  Filled 2018-06-24 (×5): qty 2

## 2018-06-24 MED ORDER — POTASSIUM PHOSPHATES 15 MMOLE/5ML IV SOLN
30.0000 mmol | Freq: Once | INTRAVENOUS | Status: AC
  Administered 2018-06-24: 30 mmol via INTRAVENOUS
  Filled 2018-06-24: qty 10

## 2018-06-24 MED ORDER — MAGNESIUM SULFATE 2 GM/50ML IV SOLN
2.0000 g | Freq: Once | INTRAVENOUS | Status: AC
  Administered 2018-06-24: 2 g via INTRAVENOUS
  Filled 2018-06-24: qty 50

## 2018-06-24 MED ORDER — AMIODARONE LOAD VIA INFUSION
150.0000 mg | Freq: Once | INTRAVENOUS | Status: AC
  Administered 2018-06-24: 150 mg via INTRAVENOUS
  Filled 2018-06-24: qty 83.34

## 2018-06-24 MED ORDER — LACTATED RINGERS IV BOLUS
1000.0000 mL | Freq: Once | INTRAVENOUS | Status: AC
  Administered 2018-06-24: 1000 mL via INTRAVENOUS

## 2018-06-24 NOTE — Progress Notes (Addendum)
Pt observed to go into Vtach again, family at bedside. ELINK notified orders for STAT mag level, recommended RN call Cardiology. Pt came out of it, back in SB 40's. BP sustaining on Neo gtt.  Cards fellow Deforest Hoyles(Akhter) paged. Updated on pt situation. Orders to start amio bolus 150 mg, then resume gtt at 60, then 30. Wife at bedside with concerns for pt reaction to amiodarone. Cards updated, wife educated and informed that RN will be observing for any reactions should they arise.

## 2018-06-24 NOTE — Progress Notes (Signed)
eLink Physician-Brief Progress Note Patient Name: Lawrence GearingJoseph R Delgado DOB: 11-Dec-1955 MRN: [id number]   Date of Service  06/24/2018  HPI/Events of Note  K+ = 2.8 and PO4--- < 1.0.  eICU Interventions  Will order: 1. Replace K+ and PO4--- 2. Repeat BMP and Phosphorus level at 1 PM.      Intervention Category Major Interventions: Electrolyte abnormality - evaluation and management  , Eugene 06/24/2018, 5:34 AM

## 2018-06-24 NOTE — Progress Notes (Signed)
Initial Nutrition Assessment  DOCUMENTATION CODES:   Obesity unspecified  INTERVENTION:   Initiate tube feeding via OG tube: - Vital High Protein @ 55 ml/hr (1320 ml/day) - Pro-stat 60 ml BID  Tube feeding regimen provides 1720 kcal, 176 grams of protein, and 1109 ml of H2O (100% of needs).  NUTRITION DIAGNOSIS:   Inadequate oral intake related to inability to eat as evidenced by NPO status.  GOAL:   Provide needs based on ASPEN/SCCM guidelines  MONITOR:   Vent status, Labs, I & O's, Weight trends, TF tolerance  REASON FOR ASSESSMENT:   Ventilator, Consult Enteral/tube feeding initiation and management  ASSESSMENT:   62 year old male who presented to the ED on 11/26 after suffering a cardiac arrest at his PCP office. CPR initiated by office staff for approximately 30 minutes. Pt experienced multiple arrests mixed with ROSC. Pt was intubated by EMS and small amount of blood found in ET tube in ED. PMH significant for CAD, CHF, IDDM, GERD, hyperlipidemia, hypertension, and prior MI.  Per critical care note, pt not a cooling candidate due to prolonged down time and bleeding on admission. Pt found to be in shock, likely cardiogenic per MD. Per cardiology note, cause of cardiac arrest may be a primary arrhythmia. Noted pt had two episodes of VT overnight that resolved spontaneously.  MRI has been ordered. Neurology consult pending once MRI is resulted.  Weight on admission was 125.1 kg and pt is +5.2 L since admit. Will use admission weight to estimate needs due to positive fluid balance.  Discussed pt with RN. Per RN, hopeful to d/c insulin drip once pt's blood sugars have stabilized.  No family present at time of RD visit. Will attempt to obtain more detailed diet and weight history upon follow-up. Per weight history in chart, pt with gradual weight gain over the past 1.5 years.  Patient is currently intubated on ventilator support. Pt with OG tube in stomach. MVe: 11.8  L/min Temp (24hrs), Avg:99.9 F (37.7 C), Min:97.9 F (36.6 C), Max:101.1 F (38.4 C) BP: 86/69 MAP: 76  Amiodarone: 16.7 ml/hr Insulin drip: 10.3 ml/hr Lactated ringers: 50 ml/hr  Neosynephrine: 11.3 ml/hr Vasopressin: 11.3 ml/hr  Medications reviewed and include: Pepcid, IV Unasyn, IV potassium phosphate 30 mmol in D5 once  Labs reviewed: potassium 2.8 (L) - being replaced, creatinine 2.85 (H), phosphorus <1.0 (L) - being replaced CBG's: 163, 160, 198, 236, 200, 223, 291, 319, 386, 452 x 12 hours (trending down on insulin drip)  UOP: 91 ml x 24 hours per chart OG output: 25 ml x 24 hours I/O's: +5.2 L since admission  NUTRITION - FOCUSED PHYSICAL EXAM:    Most Recent Value  Orbital Region  No depletion  Upper Arm Region  No depletion  Thoracic and Lumbar Region  No depletion  Buccal Region  Unable to assess  Temple Region  No depletion  Clavicle Bone Region  No depletion  Clavicle and Acromion Bone Region  No depletion  Scapular Bone Region  Unable to assess  Dorsal Hand  No depletion  Patellar Region  No depletion  Anterior Thigh Region  No depletion  Posterior Calf Region  Mild depletion  Edema (RD Assessment)  Moderate [BLE]  Hair  Reviewed  Eyes  Reviewed  Mouth  Unable to assess  Skin  Reviewed  Nails  Reviewed       Diet Order:   Diet Order            Diet NPO time specified  Diet effective now              EDUCATION NEEDS:   Not appropriate for education at this time  Skin:  Skin Assessment: Reviewed RN Assessment  Last BM:  PTA/unknown  Height:   Ht Readings from Last 1 Encounters:  05/30/2018 6\' 1"  (1.854 m)    Weight:   Wt Readings from Last 1 Encounters:  06/24/18 135.7 kg    Ideal Body Weight:  83.64 kg  BMI:  Body mass index is 39.47 kg/m.  Estimated Nutritional Needs:   Kcal:  [id number]  Protein:  167-182 grams  Fluid:  >/= 1.5 L or per MD    Earma ReadingKate Jablonski Dalonda Simoni, MS, RD, LDN Inpatient Clinical  Dietitian Pager: [phone] Weekend/After Hours: [phone]

## 2018-06-24 NOTE — Progress Notes (Signed)
K 2.8, Phos <1.0. Elink notified. Awaiting orders

## 2018-06-24 NOTE — Procedures (Signed)
History: 62 year old male status3 post cardiac arrest, also with AKI  Sedation: None  Technique: This is a 21 channel routine scalp EEG performed at the bedside with bipolar and monopolar montages arranged in accordance to the international 10/20 system of electrode placement. One channel was dedicated to EKG recording.    Background: There are generalized periodic discharges with a frequency of 1 Hz with bifrontal predominance with an AP lag.  There is no evolution to suggest an ictal nature to these discharges.  Photic stimulation: Physiologic driving is not performed  EEG Abnormalities: 1) generalized periodic discharges with triphasic morphology 2) generalized irregular slow activity  Clinical Interpretation: This EEG is consistent with a generalized nonspecific cerebral dysfunction.  The triphasic discharges seen are typically associated with metabolic encephalopathy, but can be seen in other conditions and could be representative of a generalized hypoxic injury.  There was no seizure or seizure predisposition recorded on this study. Please note that a normal EEG does not preclude the possibility of epilepsy.   Ritta SlotMcNeill Kirkpatrick, MD [facility] [phone]  If 7pm- 7am, please page neurology on call as listed in AMION.

## 2018-06-24 NOTE — Progress Notes (Signed)
Progress Note  Patient Name: Lawrence Delgado Date of Encounter: 06/24/2018  Primary Cardiologist: Kirk Ruths, MD   Subjective   Had hypotension last night.  Neosynephrine started.  Inpatient Medications    Scheduled Meds: . chlorhexidine gluconate (MEDLINE KIT)  15 mL Mouth Rinse BID  . famotidine  20 mg Per Tube BID  . mouth rinse  15 mL Mouth Rinse 10 times per day   Continuous Infusions: . sodium chloride    . sodium chloride 250 mL (06/21/2018 1332)  . sodium chloride Stopped (06/24/18 0925)  . sodium chloride    . amiodarone 30 mg/hr (06/24/18 1000)  . ampicillin-sulbactam (UNASYN) IV Stopped (06/24/18 0827)  . insulin 13.8 mL/hr at 06/24/18 1000  . lactated ringers 50 mL/hr at 06/24/18 1000  . norepinephrine (LEVOPHED) Adult infusion Stopped (05/30/2018 2229)  . phenylephrine (NEO-SYNEPHRINE) Adult infusion 100 mcg/min (06/24/18 0720)  . potassium PHOSPHATE IVPB (in mmol) 85 mL/hr at 06/24/18 1000  . vasopressin (PITRESSIN) infusion - *FOR SHOCK* 0.03 Units/min (06/24/18 1000)   PRN Meds: Place/Maintain arterial line **AND** sodium chloride, acetaminophen, docusate, fentaNYL (SUBLIMAZE) injection, midazolam, ondansetron (ZOFRAN) IV, perflutren lipid microspheres (DEFINITY) IV suspension   Vital Signs    Vitals:   06/24/18 0930 06/24/18 0945 06/24/18 1000 06/24/18 1015  BP:   96/74   Pulse: (!) 52 (!) 50 (!) 51 (!) 51  Resp: _0 Temp: (!) 100.9 F (38.3 C) (!) 100.9 F (38.3 C) (!) 100.9 F (38.3 C) (!) 101.1 F (38.4 C)  TempSrc:      SpO2: 99% 99% 99% 99%  Weight:      Height:        Intake/Output Summary (Last 24 hours) at 06/24/2018 1050 Last data filed at 06/24/2018 1000 Gross per 24 hour  Intake 5238.13 ml  Output 156 ml  Net 5082.13 ml   Filed Weights   05/31/2018 1500 06/24/18 0500  Weight: 125.1 kg 135.7 kg    Telemetry    NSR- Personally Reviewed  ECG    *NSR, no ST changes on 11/26 - Personally Reviewed  Physical Exam    GEN: No acute distress. INtubated, sedated  Neck: No JVD Cardiac: RRR, no murmurs, rubs, or gallops.  Respiratory: Coarse breath sounds to auscultation bilaterally. GI: Soft, nontender, non-distended , obese MS: No edema; No deformity. 2+ DP pulses bilaterally Neuro:  Nonfocal  Psych: Intubated, sedated  Labs    Chemistry Recent Labs  Lab 05/30/2018 1259  06/14/2018 1455 05/30/2018 2219 06/24/18 0407  NA 138   < > 134* 133* 135  K 2.2*   < > 3.1* 3.4* 2.8*  CL 95*   < > 94* 95* 100  CO2 30  --  27 20* 22  GLUCOSE 398*   < > 499* 564* 346*  BUN 11   < > _1 CREATININE 1.99*   < > 1.93* 2.51* 2.85*  CALCIUM 8.0*  --  7.2* 7.6* 7.6*  PROT 6.8  --   --   --   --   ALBUMIN 2.8*  --   --   --   --   AST 50*  --   --   --   --   ALT 18  --   --   --   --   ALKPHOS 54  --   --   --   --   BILITOT 0.9  --   --   --   --  GFRNONAA 35*  --  37* 27* 23*  GFRAA 41*  --  43* 31* 27*  ANIONGAP 13  --  13 18* 13   < > = values in this interval not displayed.     Hematology Recent Labs  Lab 06/01/2018 1259 06/09/2018 1301 06/06/2018 2042 06/24/18 0407  WBC 26.4*  --  21.4* 18.3*  RBC 5.12  --  4.58 4.40  HGB 15.1 15.0 13.6 13.2  HCT 47.7 44.0 40.1 38.0*  MCV 93.2  --  87.6 86.4  MCH 29.5  --  29.7 30.0  MCHC 31.7  --  33.9 34.7  RDW 12.9  --  12.9 12.9  PLT 312  --  273 230    Cardiac EnzymesNo results for input(s): TROPONINI in the last 168 hours.  Recent Labs  Lab 06/03/2018 1301  TROPIPOC 0.07     BNPNo results for input(s): BNP, PROBNP in the last 168 hours.   DDimer No results for input(s): DDIMER in the last 168 hours.   Radiology    Ct Head Wo Contrast  Result Date: 06/02/2018 CLINICAL DATA:  Altered level of consciousness. EXAM: CT HEAD WITHOUT CONTRAST TECHNIQUE: Contiguous axial images were obtained from the base of the skull through the vertex without intravenous contrast. COMPARISON:  None. FINDINGS: Brain: Mild atrophy. Negative for hydrocephalus.  Negative for acute infarct, hemorrhage, mass Vascular: Negative for hyperdense vessel Skull: Negative Sinuses/Orbits: Patient is intubated. Paranasal sinuses clear. Normal orbit. Other: None IMPRESSION: No acute intracranial abnormality. Electronically Signed   By: Franchot Gallo M.D.   On: 06/21/2018 17:22   Ct Chest Wo Contrast  Result Date: 06/09/2018 CLINICAL DATA:  62 y/o  M; pulseless collapse with apnea post CPR. EXAM: CT CHEST WITHOUT CONTRAST TECHNIQUE: Multidetector CT imaging of the chest was performed following the standard protocol without IV contrast. COMPARISON:  04/03/2017 CT chest. 06/03/2018 chest radiograph. FINDINGS: Cardiovascular: Normal heart size. No pericardial effusion. Normal caliber thoracic aorta and main pulmonary artery. Coronary artery calcific atherosclerosis with RCA and LAD stents. Mild aortic calcific atherosclerosis. Left central venous catheter tip extends to upper SVC. Mediastinum/Nodes: No enlarged mediastinal or axillary lymph nodes. Thyroid gland, trachea, and esophagus demonstrate no significant findings. Lungs/Pleura: Moderate bilateral pleural effusions. Diffuse consolidation greatest in right upper and left lower lobes. No pneumothorax. Upper Abdomen: Enteric tube tip extends to the gastric body. Musculoskeletal: Left 2-7 acute nondisplaced mildly angulated left anterior rib fractures. 5-7 mildly displaced acute right lateral rib fracture. IMPRESSION: 1. Diffuse pulmonary consolidation greatest in right upper lobe and left lower lobe may represent multifocal pneumonia, pulmonary edema, or ARDS. 2. Moderate bilateral pleural effusions. 3. Left 2-7 nondisplaced mildly angulated left anterior rib fractures. 5-7 mildly displaced acute right lateral rib fracture. 4. Coronary artery and aortic calcific Electronically Signed   By: Kristine Garbe M.D.   On: 06/02/2018 17:31   Dg Chest Port 1 View  Result Date: 06/24/2018 CLINICAL DATA:  Respiratory failure,  history of cardiac arrest EXAM: PORTABLE CHEST 1 VIEW COMPARISON:  06/19/2018 FINDINGS: Cardiac shadow remains enlarged. Left jugular central line endotracheal tube and nasogastric catheter are again seen. The effusion seen previously are not as well appreciated due to the patient positioning. Diffuse bilateral parenchymal opacities are noted worst in the bases bilaterally. The upper lobes show better aeration although some persistent alveolar density remains. These changes likely represented diffuse edema with some slight improvement. IMPRESSION: Improved aeration in the upper lobes when compared with the prior exam. Persistent bibasilar changes are  noted. Tubes and lines as described. Electronically Signed   By: Inez Catalina M.D.   On: 06/24/2018 09:00   Dg Chest Port 1 View  Result Date: 06/26/2018 CLINICAL DATA:  Central catheter placement.  Hypoxia. EXAM: PORTABLE CHEST 1 VIEW COMPARISON:  June 23, 2018 study obtained earlier in the day FINDINGS: Endotracheal tube tip is 4.9 cm above the carina. Nasogastric tube tip and side port are below the diaphragm. Central catheter tip is in the superior vena cava. No pneumothorax. There is widespread airspace consolidation throughout much of the right lung, stable. There is interstitial edema with perihilar interstitial edema. There is cardiomegaly with pulmonary venous hypertension. No adenopathy. No bone lesions. IMPRESSION: Tube and catheter positions as described without pneumothorax. Pulmonary vascular congestion with perihilar interstitial edema. Extensive airspace consolidation, likely pneumonia, throughout much of the right lung. Electronically Signed   By: Lowella Grip III M.D.   On: 06/07/2018 15:47   Dg Chest Port 1 View  Result Date: 06/16/2018 CLINICAL DATA:  Post arrest. Status post endotracheal and esophagogastric tube placement. EXAM: PORTABLE CHEST 1 VIEW COMPARISON:  PA and lateral chest x-ray dated Dec 16, 2017 FINDINGS: The  endotracheal tube tip lies at the inferior margin of the clavicular heads approximately 4 cm above the carina. The esophagogastric tube tip and proximal port project below the expected location of the GE junction. There is confluent airspace opacity throughout much of the right lung which is new. The left lung exhibits mildly increased interstitial markings slightly more conspicuous overall. The pulmonary vascularity is engorged and indistinct. The cardiac silhouette remains enlarged. External pacemaker defibrillator pads are present. There are fractures of the lateral aspects of the right fifth through seventh ribs. IMPRESSION: Confluent airspace opacity throughout much of the right lung consistent with asymmetric pulmonary edema or other alveolar filling process. Coarse interstitial markings throughout the left lung without alveolar infiltrates. Mild cardiomegaly with pulmonary vascular congestion. Displaced fractures of the posterolateral aspects of the right fifth, sixth, and seventh ribs. No pneumothorax. The support tubes are in reasonable position. Electronically Signed   By: David  Martinique M.D.   On: 05/31/2018 13:28    Cardiac Studies   Echo images personlly reviewed- difficult study- await definity images  Patient Profile     62 y.o. male with cardiac arrest  Assessment & Plan    Had bradycardia.  Holding beta blocker.  COuld consder a pressor that also increased HR if needed.  Responding well to neosyneophrine.  Still some bradycardia but no apparent indication for temp pacer at this time.  Monitor for CNS recovery.  Vent management per CCM,.     For questions or updates, please contact [place] Please consult [email or web address] for contact info under        Signed, Larae Grooms, MD  06/24/2018, 10:50 AM

## 2018-06-24 NOTE — Progress Notes (Signed)
  Echocardiogram 2D Echocardiogram has been performed.  Lawrence Delgado 06/24/2018, 9:49 AM

## 2018-06-24 NOTE — Progress Notes (Signed)
eLink Physician-Brief Progress Note Patient Name: Marchelle GearingJoseph R Gullickson DOB: 22-Apr-1956 MRN: [id number]   Date of Service  06/24/2018  HPI/Events of Note  Multiple issues: 1. Oliguria - LVEF = 35-40% and 2. Mg++ = 1.6 and Creatinine = 2.51.  eICU Interventions  Will order: 1. Monitor CVP now and Q 4 hours. 2. Replace Mg++.      Intervention Category Major Interventions: Electrolyte abnormality - evaluation and management Intermediate Interventions: Oliguria - evaluation and management  Kasara Schomer Eugene 06/24/2018, 1:52 AM

## 2018-06-24 NOTE — Progress Notes (Signed)
EEG complete - results pending 

## 2018-06-24 NOTE — Progress Notes (Signed)
RT NOTE: RT transported patient on ventilator from room 2H09 to MRI and back to room 2H09 with no apparent complications. Vitals are stable. RT will continue to monitor.

## 2018-06-24 NOTE — Progress Notes (Addendum)
NAME:  DE JAWORSKI, MRN:  [id number], DOB:  [birth year], LOS: 1 ADMISSION DATE:  2018-07-23, CONSULTATION DATE:  Jul 23, 2018 REFERRING MD:  Dr. Charm Barges, CHIEF COMPLAINT:  Cardiac Arrest    Brief History   62 y/o M who presented to [facility] on 11/26 after suffering a cardiac arrest.    History of present illness   62 y/o M who presented to [facility] on 11/26 after suffering a cardiac arrest.    The patient was apparently at his PCP office in [facility] for blood work when he collapsed and was pulseless / apneic.  CPR was initiated by office staff.  CPR was completed for approximately 30 minutes.  On EMS arrival, he was in VF and required shock x1.  He had multiple arrests mixed with ROSC.  The patient was placed on the [place] device.  He was intubated per EMS.  He was given 3L total IVF.   While in ER, he had further PEA arrest.  The patient was stabilized in the ER.  Initial evaluation notable for CXR with dense right sided airspace disease.  Bloody secretions were noted from ETT.  ABG 7.329 / 58 / 49 / 30.  Na 134, K 5.1 (one drawn 4 minutes before as 2.2, repeat pending), Cl 95, glucose 375, BUN 16, Sr 1.70, troponin 0.07, lactic acid 7.70, WBC 26.4, Hgb 15.1, and platelets 312.    PCCM called for ICU admission.   Past Medical History  OSA Obesity  MI, x5 stents HTN CHF CAD HLD AF on Eliquis GERD DM   Significant Hospital Events   11/26 Admit   Consults:    Procedures:  ETT 11/26 >>  L IJ TLC 11/26 >>   Significant Diagnostic Tests:  11/26  Admit after cardiac arrest  Micro Data:  BCx2 11/26 >>  Tracheal Aspirate 11/26 >>   Antimicrobials:  Unasyn 11/26 >>    Interim history/subjective:  Two episodes of VT overnight that resolved spontaneously   Objective   Blood pressure 96/74, pulse (!) 51, temperature (!) 101.1 F (38.4 C), resp. rate 20, height 6\' 1"  (1.854 m), weight 135.7 kg, SpO2 99 %. CVP:  [8 mmHg-11 mmHg] 8 mmHg  Vent Mode: PRVC FiO2 (%):  [60 %-100 %] 60  % Set Rate:  [16 bmp-20 bmp] 20 bmp Vt Set:  [640 mL] 640 mL PEEP:  [8 cmH20-10 cmH20] 8 cmH20 Plateau Pressure:  [27 cmH20-33 cmH20] 27 cmH20   Intake/Output Summary (Last 24 hours) at 06/24/2018 1057 Last data filed at 06/24/2018 1000 Gross per 24 hour  Intake 5238.13 ml  Output 156 ml  Net 5082.13 ml   Filed Weights   07/23/18 1500 06/24/18 0500  Weight: 125.1 kg 135.7 kg    Examination: General: Chronically ill appearing male, in bed, NAD on no sedation HEENT: [place]/AT, pupils sluggish, EOM-spontaneou and MMM Neuro: Unresponsive, gag intact and has a respiratory drive CV: RRR, Nl W0/J8 and -M/R/G PULM: Coarse BS diffusely GI: Soft, NT, ND and +BS Extremities: Warm, -edema and -tenderness Skin: no rashes or lesions.  Flaky / dry skin with scaling  Resolved Hospital Problem list      Assessment & Plan:   Cardiac Arrest  -CPR performed by bystanders, approximately 30+ minutes total CPR P: No hypothermia given eliquis use and infection ECHO done and pending Assess blood cultures  Cardiology consult appreciated  Shock -suspect cardiogenic, r/o septic, blood loss  P: Neo due to VT overnight Vasopressin drip Trend CBC, r/o bleed.  Follow  up at 8pm Check cortisol level and start stress dose F/u echo  Acute Hypoxemic Respiratory Failure  -post arrest, admit CXR with dense right sided airspace disease  P: Maintain on full vent support Wean PEEP/FiO2 for sats >90% Empiric aspiration coverage with unasyn  SBT/WUA when mental status permits  Culture sputum  CT of the chest with R>L infiltrate that I reviewed myself.  AKI -suspect pre-renal in setting of arrest, possible  P: Trend BMP / urinary output Replace electrolytes as indicated Avoid nephrotoxic agents, ensure adequate renal perfusion Follow CVP=8 LR 50 ml/hr  Hypokalemia  P: Replace as indicated  BMET in AM IVF bolus BMET at 2 PM today  Acute Anoxic/Metabolic Encephalopathy  -post arrest   P: Minimize sedating medications  PRN PAD protocol  CT head to r/o bleed negative MRI of the brain ordered EEG ordered Will need to consult with neurology once above is resulted  AF  -rate controlled on admit  -on Eliquis at baseline, bleeding on admit  P: Tele monitoring  Assess EKG Hold home Eliquis with concern for bleeding  VT: Amiodarone Tele monitoring  GERD P: Pepcid  Consult nutrition for TF as per nutrition  HTN, HLD, CAD s/p stent, CHF  P: Hold home medications  Cards following  DM  P: SSI, moderate scale   Family updated bedside  Best practice:  Diet: TF Pain/Anxiety/Delirium protocol (if indicated): PRN fentanyl / versed  VAP protocol (if indicated): ordered  DVT prophylaxis: SCD's.  May need to consider heparin 11/27 if no bleeding GI prophylaxis: Pepcid  Glucose control: SSI  Mobility: bedrest  Code Status: Limited code Family Communication: Wife, mother, brother updated per Dr. Molli Knock 11/26.  Disposition: ICU  Labs   CBC: Recent Labs  Lab 2018/07/14 1259 July 14, 2018 1301 July 14, 2018 2042 06/24/18 0407  WBC 26.4*  --  21.4* 18.3*  NEUTROABS 16.9*  --   --   --   HGB 15.1 15.0 13.6 13.2  HCT 47.7 44.0 40.1 38.0*  MCV 93.2  --  87.6 86.4  PLT 312  --  273 230    Basic Metabolic Panel: Recent Labs  Lab 14-Jul-2018 1259 07-14-18 1301 Jul 14, 2018 1455 07/14/2018 2219 06/24/18 0045 06/24/18 0407  NA 138 134* 134* 133*  --  135  K 2.2* 5.1 3.1* 3.4*  --  2.8*  CL 95* 95* 94* 95*  --  100  CO2 30  --  27 20*  --  22  GLUCOSE 398* 375* 499* 564*  --  346*  BUN 11 16 12 18   --  20  CREATININE 1.99* 1.70* 1.93* 2.51*  --  2.85*  CALCIUM 8.0*  --  7.2* 7.6*  --  7.6*  MG  --   --  1.2*  --  1.6* 2.2  PHOS  --   --   --   --   --  <1.0*   GFR: Estimated Creatinine Clearance: 39.8 mL/min (A) (by C-G formula based on SCr of 2.85 mg/dL (H)). Recent Labs  Lab 07-14-18 1259 07-14-18 1305 2018-07-14 1650 07/14/2018 2042 06/24/18 0407  WBC 26.4*   --   --  21.4* 18.3*  LATICACIDVEN  --  7.70* 4.78*  --   --     Liver Function Tests: Recent Labs  Lab Jul 14, 2018 1259  AST 50*  ALT 18  ALKPHOS 54  BILITOT 0.9  PROT 6.8  ALBUMIN 2.8*   Recent Labs  Lab 07/14/2018 1259  LIPASE 33   No results for input(s):  AMMONIA in the last 168 hours.  ABG    Component Value Date/Time   PHART 7.487 (H) 06/24/2018 0404   PCO2ART 32.4 06/24/2018 0404   PO2ART 92.0 06/24/2018 0404   HCO3 24.5 06/24/2018 0404   TCO2 26 06/24/2018 0404   O2SAT 98.0 06/24/2018 0404     Coagulation Profile: Recent Labs  Lab 06/03/2018 1259  INR 1.45    Cardiac Enzymes: No results for input(s): CKTOTAL, CKMB, CKMBINDEX, TROPONINI in the last 168 hours.  HbA1C: Hgb A1c MFr Bld  Date/Time Value Ref Range Status  03/04/2017 02:01 PM 10.7 (H) 4.8 - 5.6 % Final    Comment:    (NOTE)         Pre-diabetes: 5.7 - 6.4         Diabetes: >6.4         Glycemic control for adults with diabetes: <7.0   05/02/2015 08:51 PM 10.2 (H) 4.8 - 5.6 % Final    Comment:    (NOTE)         Pre-diabetes: 5.7 - 6.4         Diabetes: >6.4         Glycemic control for adults with diabetes: <7.0     CBG: Recent Labs  Lab 06/24/18 0611 06/24/18 0651 06/24/18 0758 06/24/18 0856 06/24/18 1007  GLUCAP 223* 200* 236* 198* 160*    Review of Systems:   Unable to complete as patient is on ventilation.  Information obtained from staff and family.    Past Medical History  He,  has a past medical history of Arthritis, Atrial fibrillation (HCC) (02/2017), CAD (coronary artery disease), CHF (congestive heart failure) (HCC), DM (diabetes mellitus) (HCC), GERD (gastroesophageal reflux disease), HLD (hyperlipidemia), HTN (hypertension), MI (myocardial infarction) (HCC), Obesity, and Sleep apnea.   Surgical History    Past Surgical History:  Procedure Laterality Date  . CARDIAC CATHETERIZATION     with coronary angiography   . CARDIOVERSION N/A 05/05/2015   Procedure:  CARDIOVERSION;  Surgeon: Vesta MixerPhilip J Nahser, MD;  [facility];  Service: Cardiovascular;  Laterality: N/A;  . CARDIOVERSION N/A 02/14/2017   Procedure: CARDIOVERSION;  Surgeon: Vesta MixerNahser, Philip J, MD;  [facility];  Service: Cardiovascular;  Laterality: N/A;  . CORONARY STENT PLACEMENT     eluting stent placement in the culpric lesion of the LAD  . TEE WITHOUT CARDIOVERSION N/A 05/05/2015   Procedure: TRANSESOPHAGEAL ECHOCARDIOGRAM (TEE);  Surgeon: Vesta MixerPhilip J Nahser, MD;  [facility];  Service: Cardiovascular;  Laterality: N/A;     Social History   reports that he quit smoking about 14 years ago. He has a 75.00 pack-year smoking history. He has never used smokeless tobacco. He reports that he does not drink alcohol or use drugs.   Family History   His family history includes Heart attack in his father and mother.   Allergies Allergies  Allergen Reactions  . Codeine Nausea And Vomiting  . Clopidogrel Bisulfate Itching and Rash     Home Medications  Prior to Admission medications   Medication Sig Start Date End Date Taking? Authorizing Provider  amiodarone (PACERONE) 200 MG tablet Take 1 tablet (200 mg total) by mouth daily. 08/15/17   Newman Niparroll, Donna C, NP  apixaban (ELIQUIS) 5 MG TABS tablet Take 1 tablet (5 mg total) by mouth 2 (two) times daily. 09/13/15   Crenshaw, Madolyn FriezeBrian S, MD  ENTRESTO 24-26 MG TAKE  (1)  TABLET TWICE A DAY. 06/22/18   Lewayne Buntingrenshaw, Brian S, MD  fluticasone furoate-vilanterol (BREO ELLIPTA) 200-25 MCG/INH AEPB Inhale 1 puff into the lungs daily. 02/05/18   Mannam, Colbert Coyer, MD  furosemide (LASIX) 40 MG tablet Take 40 mg by mouth daily. Can take additional 40mg  as needed for weight gain of 2-3lbs    [provider]  furosemide (LASIX) 40 MG tablet TAKE 1 TABLET DAILY 05/18/18   Lewayne Bunting, MD  insulin lispro (HUMALOG) 100 UNIT/ML injection Inject 7-17 Units into the skin 3 (three) times daily before meals. Sliding scale    [provider]  insulin lispro protamine-lispro (HUMALOG 75/25 MIX) (75-25) 100 UNIT/ML SUSP injection Inject 50 Units into the skin 2 (two) times daily with a meal.     [provider]  metFORMIN (GLUCOPHAGE) 500 MG tablet Take 500 mg by mouth 2 (two) times daily with a meal.     [provider]  metoprolol succinate (TOPROL-XL) 50 MG 24 hr tablet Take 3 tablets (150 mg total) by mouth as directed. Take with or immediately following a meal. 06/02/18   Crenshaw, Madolyn Frieze, MD  nitroGLYCERIN (NITROSTAT) 0.4 MG SL tablet Place 1 tablet (0.4 mg total) under the tongue every 5 (five) minutes as needed. 03/27/16   Lewayne Bunting, MD  nitroGLYCERIN (NITROSTAT) 0.4 MG SL tablet PLACE 1 TABLET UNDER THE TONGUE AT ONSET OF CHEST PAIN EVERY 5 MINTUES UP TO 3 TIMES AS NEEDED 11/27/17   Lewayne Bunting, MD  rosuvastatin (CRESTOR) 40 MG tablet Take 1 tablet (40 mg total) by mouth daily. TAKE 1 TABLET (40mg ) BY MOUTH DAILY 02/18/18   Lewayne Bunting, MD  spironolactone (ALDACTONE) 25 MG tablet Take 0.5 tablets (12.5 mg total) by mouth daily. 04/13/18   Lewayne Bunting, MD  Vitamin D, Ergocalciferol, (DRISDOL) 50000 UNITS CAPS Take 50,000 Units by mouth 2 (two) times a week. Monday and Thursday    [provider]    The patient is critically ill with multiple organ systems failure and requires high complexity decision making for assessment and support, frequent evaluation and titration of therapies, application of advanced monitoring technologies and extensive interpretation of multiple databases.   Critical Care Time devoted to patient care services described in this note is  34  Minutes. This time reflects time of care of this signee Dr Koren Bound. This critical care time does not reflect procedure time, or teaching time or supervisory time of PA/NP/Med student/Med Resident etc but could involve care discussion time.  Alyson Reedy, M.D. [facility] Pulmonary/Critical Care  Medicine. Pager: [phone]. After hours pager: [phone].

## 2018-06-25 ENCOUNTER — Inpatient Hospital Stay (HOSPITAL_COMMUNITY): Payer: Medicare Other

## 2018-06-25 DIAGNOSIS — I25119 Atherosclerotic heart disease of native coronary artery with unspecified angina pectoris: Secondary | ICD-10-CM

## 2018-06-25 DIAGNOSIS — E876 Hypokalemia: Secondary | ICD-10-CM

## 2018-06-25 DIAGNOSIS — R579 Shock, unspecified: Secondary | ICD-10-CM

## 2018-06-25 LAB — GLUCOSE, CAPILLARY
GLUCOSE-CAPILLARY: 142 mg/dL — AB (ref 70–99)
GLUCOSE-CAPILLARY: 164 mg/dL — AB (ref 70–99)
GLUCOSE-CAPILLARY: 138 mg/dL — AB (ref 70–99)
Glucose-Capillary: 124 mg/dL — ABNORMAL HIGH (ref 70–99)
Glucose-Capillary: 128 mg/dL — ABNORMAL HIGH (ref 70–99)
Glucose-Capillary: 131 mg/dL — ABNORMAL HIGH (ref 70–99)
Glucose-Capillary: 131 mg/dL — ABNORMAL HIGH (ref 70–99)
Glucose-Capillary: 133 mg/dL — ABNORMAL HIGH (ref 70–99)
Glucose-Capillary: 138 mg/dL — ABNORMAL HIGH (ref 70–99)
Glucose-Capillary: 138 mg/dL — ABNORMAL HIGH (ref 70–99)
Glucose-Capillary: 147 mg/dL — ABNORMAL HIGH (ref 70–99)
Glucose-Capillary: 149 mg/dL — ABNORMAL HIGH (ref 70–99)
Glucose-Capillary: 151 mg/dL — ABNORMAL HIGH (ref 70–99)
Glucose-Capillary: 153 mg/dL — ABNORMAL HIGH (ref 70–99)
Glucose-Capillary: 155 mg/dL — ABNORMAL HIGH (ref 70–99)
Glucose-Capillary: 156 mg/dL — ABNORMAL HIGH (ref 70–99)
Glucose-Capillary: 165 mg/dL — ABNORMAL HIGH (ref 70–99)
Glucose-Capillary: 166 mg/dL — ABNORMAL HIGH (ref 70–99)
Glucose-Capillary: 170 mg/dL — ABNORMAL HIGH (ref 70–99)
Glucose-Capillary: 178 mg/dL — ABNORMAL HIGH (ref 70–99)
Glucose-Capillary: 181 mg/dL — ABNORMAL HIGH (ref 70–99)

## 2018-06-25 LAB — BLOOD GAS, ARTERIAL
ACID-BASE EXCESS: 0.7 mmol/L (ref 0.0–2.0)
Bicarbonate: 24.1 mmol/L (ref 20.0–28.0)
Drawn by: 25203
FIO2: 50
LHR: 14 {breaths}/min
O2 SAT: 98.4 %
PATIENT TEMPERATURE: 98.6
PCO2 ART: 33.6 mmHg (ref 32.0–48.0)
PEEP/CPAP: 8 cmH2O
PH ART: 7.468 — AB (ref 7.350–7.450)
VT: 640 mL
pO2, Arterial: 104 mmHg (ref 83.0–108.0)

## 2018-06-25 LAB — PHOSPHORUS
PHOSPHORUS: 2.4 mg/dL — AB (ref 2.5–4.6)
Phosphorus: 2.8 mg/dL (ref 2.5–4.6)

## 2018-06-25 LAB — BASIC METABOLIC PANEL
ANION GAP: 12 (ref 5–15)
Anion gap: 11 (ref 5–15)
BUN: 37 mg/dL — ABNORMAL HIGH (ref 6–20)
BUN: 47 mg/dL — ABNORMAL HIGH (ref 6–20)
CHLORIDE: 102 mmol/L (ref 98–111)
CO2: 23 mmol/L (ref 22–32)
CO2: 23 mmol/L (ref 22–32)
CREATININE: 3.32 mg/dL — AB (ref 0.61–1.24)
Calcium: 7.7 mg/dL — ABNORMAL LOW (ref 8.9–10.3)
Calcium: 7.8 mg/dL — ABNORMAL LOW (ref 8.9–10.3)
Chloride: 103 mmol/L (ref 98–111)
Creatinine, Ser: 3.68 mg/dL — ABNORMAL HIGH (ref 0.61–1.24)
GFR calc Af Amer: 20 mL/min — ABNORMAL LOW (ref >60)
GFR calc non Af Amer: 19 mL/min — ABNORMAL LOW (ref >60)
GFR calc non Af Amer: 17 mL/min — ABNORMAL LOW (ref >60)
GFR, EST AFRICAN AMERICAN: 22 mL/min — AB (ref >60)
Glucose, Bld: 146 mg/dL — ABNORMAL HIGH (ref 70–99)
Glucose, Bld: 146 mg/dL — ABNORMAL HIGH (ref 70–99)
POTASSIUM: 3.1 mmol/L — AB (ref 3.5–5.1)
Potassium: 3.3 mmol/L — ABNORMAL LOW (ref 3.5–5.1)
Sodium: 136 mmol/L (ref 135–145)
Sodium: 138 mmol/L (ref 135–145)

## 2018-06-25 LAB — CBC
HEMATOCRIT: 34 % — AB (ref 39.0–52.0)
HEMOGLOBIN: 11.6 g/dL — AB (ref 13.0–17.0)
MCH: 29.9 pg (ref 26.0–34.0)
MCHC: 34.1 g/dL (ref 30.0–36.0)
MCV: 87.6 fL (ref 80.0–100.0)
Platelets: 143 10*3/uL — ABNORMAL LOW (ref 150–400)
RBC: 3.88 MIL/uL — AB (ref 4.22–5.81)
RDW: 13.6 % (ref 11.5–15.5)
WBC: 10.6 10*3/uL — ABNORMAL HIGH (ref 4.0–10.5)
nRBC: 0 % (ref 0.0–0.2)

## 2018-06-25 LAB — MAGNESIUM
Magnesium: 1.8 mg/dL (ref 1.7–2.4)
Magnesium: 2.5 mg/dL — ABNORMAL HIGH (ref 1.7–2.4)

## 2018-06-25 MED ORDER — VALPROATE SODIUM 500 MG/5ML IV SOLN
500.0000 mg | Freq: Three times a day (TID) | INTRAVENOUS | Status: DC
  Filled 2018-06-25: qty 5

## 2018-06-25 MED ORDER — LEVETIRACETAM IN NACL 500 MG/100ML IV SOLN
500.0000 mg | Freq: Two times a day (BID) | INTRAVENOUS | Status: DC
  Administered 2018-06-25 – 2018-06-26 (×2): 500 mg via INTRAVENOUS
  Filled 2018-06-25 (×2): qty 100

## 2018-06-25 MED ORDER — POTASSIUM CHLORIDE 20 MEQ/15ML (10%) PO SOLN
40.0000 meq | Freq: Two times a day (BID) | ORAL | Status: AC
  Administered 2018-06-25 (×2): 40 meq via ORAL
  Filled 2018-06-25 (×2): qty 30

## 2018-06-25 MED ORDER — MAGNESIUM SULFATE 2 GM/50ML IV SOLN
2.0000 g | Freq: Once | INTRAVENOUS | Status: AC
  Administered 2018-06-25: 2 g via INTRAVENOUS
  Filled 2018-06-25: qty 50

## 2018-06-25 MED ORDER — INSULIN ASPART 100 UNIT/ML ~~LOC~~ SOLN: 3.0000 [IU] | SUBCUTANEOUS | Status: DC

## 2018-06-25 MED ORDER — LORAZEPAM 2 MG/ML IJ SOLN
2.0000 mg | Freq: Once | INTRAMUSCULAR | Status: AC
  Administered 2018-06-25: 2 mg via INTRAVENOUS

## 2018-06-25 MED ORDER — MIDAZOLAM HCL 2 MG/2ML IJ SOLN
4.0000 mg | Freq: Once | INTRAMUSCULAR | Status: AC
  Administered 2018-06-25: 4 mg via INTRAVENOUS

## 2018-06-25 MED ORDER — GLUCERNA 1.2 CAL PO LIQD
1000.0000 mL | ORAL | Status: DC
  Administered 2018-06-25: 1000 mL
  Filled 2018-06-25 (×2): qty 1000

## 2018-06-25 MED ORDER — LORAZEPAM 2 MG/ML IJ SOLN
INTRAMUSCULAR | Status: AC
  Filled 2018-06-25: qty 1

## 2018-06-25 MED ORDER — MIDAZOLAM HCL 2 MG/2ML IJ SOLN
INTRAMUSCULAR | Status: AC
  Filled 2018-06-25: qty 4

## 2018-06-25 MED ORDER — INSULIN GLARGINE 100 UNIT/ML ~~LOC~~ SOLN
40.0000 [IU] | Freq: Two times a day (BID) | SUBCUTANEOUS | Status: DC
  Filled 2018-06-25 (×3): qty 0.4

## 2018-06-25 MED ORDER — FUROSEMIDE 10 MG/ML IJ SOLN
100.0000 mg | Freq: Once | INTRAVENOUS | Status: AC
  Administered 2018-06-25: 100 mg via INTRAVENOUS
  Filled 2018-06-25: qty 10

## 2018-06-25 MED ORDER — SODIUM CHLORIDE 0.9 % IV SOLN
3.0000 g | Freq: Three times a day (TID) | INTRAVENOUS | Status: DC
  Administered 2018-06-25 – 2018-06-27 (×6): 3 g via INTRAVENOUS
  Filled 2018-06-25 (×7): qty 3

## 2018-06-25 MED ORDER — LORAZEPAM 2 MG/ML IJ SOLN
2.0000 mg | INTRAMUSCULAR | Status: AC
  Administered 2018-06-25: 2 mg via INTRAVENOUS
  Filled 2018-06-25: qty 1

## 2018-06-25 MED ORDER — LEVETIRACETAM IN NACL 1000 MG/100ML IV SOLN: 1000.0000 mg | Freq: Two times a day (BID) | INTRAVENOUS | Status: DC

## 2018-06-25 MED ORDER — VALPROATE SODIUM 500 MG/5ML IV SOLN
500.0000 mg | Freq: Three times a day (TID) | INTRAVENOUS | Status: DC
  Administered 2018-06-26 – 2018-06-29 (×11): 500 mg via INTRAVENOUS
  Filled 2018-06-25 (×11): qty 5

## 2018-06-25 MED ORDER — HYDROCORTISONE NA SUCCINATE PF 100 MG IJ SOLR
25.0000 mg | Freq: Four times a day (QID) | INTRAMUSCULAR | Status: DC
  Administered 2018-06-25 – 2018-06-26 (×2): 25 mg via INTRAVENOUS
  Filled 2018-06-25 (×2): qty 2

## 2018-06-25 MED ORDER — VALPROATE SODIUM 500 MG/5ML IV SOLN
2500.0000 mg | Freq: Once | INTRAVENOUS | Status: AC
  Administered 2018-06-25: 2500 mg via INTRAVENOUS
  Filled 2018-06-25: qty 25

## 2018-06-25 MED ORDER — POTASSIUM CHLORIDE 20 MEQ/15ML (10%) PO SOLN
20.0000 meq | Freq: Once | ORAL | Status: AC
  Administered 2018-06-25: 20 meq
  Filled 2018-06-25: qty 15

## 2018-06-25 NOTE — Progress Notes (Signed)
Progress Note  Patient Name: Lawrence Delgado Date of Encounter: 06/25/2018  Primary Cardiologist:  Dr. Stanford Breed  Subjective   Intubated on vent;  BP low on neo-synephrine  Inpatient Medications    Scheduled Meds: . chlorhexidine gluconate (MEDLINE KIT)  15 mL Mouth Rinse BID  . famotidine  20 mg Per Tube BID  . feeding supplement (PRO-STAT SUGAR FREE 64)  60 mL Per Tube BID  . hydrocortisone sodium succinate  50 mg Intravenous Q6H  . mouth rinse  15 mL Mouth Rinse 10 times per day   Continuous Infusions: . sodium chloride    . sodium chloride 250 mL (06/03/2018 1332)  . sodium chloride 250 mL (06/25/18 0816)  . sodium chloride    . amiodarone 30 mg/hr (06/25/18 0800)  . ampicillin-sulbactam (UNASYN) IV 3 g (06/25/18 0818)  . feeding supplement (VITAL HIGH PROTEIN) 1,000 mL (06/24/18 1737)  . insulin 8.1 mL/hr at 06/25/18 0800  . lactated ringers 50 mL/hr at 06/25/18 0800  . norepinephrine (LEVOPHED) Adult infusion Stopped (06/08/2018 2229)  . phenylephrine (NEO-SYNEPHRINE) Adult infusion 100 mcg/min (06/24/18 0720)  . vasopressin (PITRESSIN) infusion - *FOR SHOCK* Stopped (06/25/18 0422)   PRN Meds: Place/Maintain arterial line **AND** sodium chloride, acetaminophen, docusate, ondansetron (ZOFRAN) IV   Vital Signs    Vitals:   06/25/18 0800 06/25/18 0815 06/25/18 0829 06/25/18 0830  BP: 130/85  130/85   Pulse: 70 64 (!) 54   Resp: (!) 26 (!) 22 17   Temp: 99.3 F (37.4 C) 99.3 F (37.4 C) 99.3 F (37.4 C)   TempSrc:      SpO2: 96% 96% 96% 97%  Weight:      Height:        Intake/Output Summary (Last 24 hours) at 06/25/2018 0846 Last data filed at 06/25/2018 0800 Gross per 24 hour  Intake 4106.43 ml  Output 426 ml  Net 3680.43 ml    I/O since admission:   Filed Weights   06/13/2018 1500 06/24/18 0500 06/25/18 0500  Weight: 125.1 kg 135.7 kg (!) 139.2 kg    Telemetry    Sinus bradycardia at 51 - Personally Reviewed  ECG    ECG (independently read  by me): NSR at 89; abnormal precordial R waves ? V2-3 lead reversal  Physical Exam   BP 130/85   Pulse (!) 54   Temp 99.3 F (37.4 C)   Resp 17   Ht 6' 1" (1.854 m)   Wt (!) 139.2 kg   SpO2 97%   BMI 40.49 kg/m  General: intubated on vent  Skin: normal turgor, no rashes, warm and dry HEENT: Normocephalic, atraumatic. Pupils equal round and reactive to light; sclera anicteric; extraocular muscles intact;  Nose without nasal septal hypertrophy Mouth/Parynx benign; Mallinpatti scale intubated Neck: No JVD, no carotid bruits; normal carotid upstroke Lungs: clear to ausculatation and percussion; no wheezing or rales Chest wall: without tenderness to palpitation Heart: PMI not displaced, RRR, s1 s2 normal, 1/6 systolic murmur, no diastolic murmur, no rubs, gallops, thrills, or heaves Abdomen: soft, nontender; no hepatosplenomehaly, BS+; abdominal aorta nontender and not dilated by palpation. Back: no CVA tenderness Pulses 2+ Musculoskeletal: full range of motion, normal strength, no joint deformities Extremities: no clubbing cyanosis or edema, Homan's sign negative  Neurologic: withdraws to pain, mild nystagmus, pupils reactive Psychologic: Normal mood and affect   Labs    Chemistry Recent Labs  Lab 06/15/2018 1259  06/24/18 1114 06/24/18 1400 06/25/18 0440  NA 138   < >  132* 135 136  K 2.2*   < > 3.0* 3.0* 3.1*  CL 95*   < > 99 100 102  CO2 30   < > 21* 24 23  GLUCOSE 398*   < > 151* 110* 146*  BUN 11   < > 22* 21* 37*  CREATININE 1.99*   < > 2.76* 2.91* 3.32*  CALCIUM 8.0*   < > 7.4* 7.3* 7.7*  PROT 6.8  --   --   --   --   ALBUMIN 2.8*  --   --   --   --   AST 50*  --   --   --   --   ALT 18  --   --   --   --   ALKPHOS 54  --   --   --   --   BILITOT 0.9  --   --   --   --   GFRNONAA 35*   < > 24* 22* 19*  GFRAA 41*   < > 28* 26* 22*  ANIONGAP 13   < > _0 < > = values in this interval not displayed.     Hematology Recent Labs  Lab 06/12/2018 2042  06/24/18 0407 06/25/18 0440  WBC 21.4* 18.3* 10.6*  RBC 4.58 4.40 3.88*  HGB 13.6 13.2 11.6*  HCT 40.1 38.0* 34.0*  MCV 87.6 86.4 87.6  MCH 29.7 30.0 29.9  MCHC 33.9 34.7 34.1  RDW 12.9 12.9 13.6  PLT 273 230 143*    Cardiac Enzymes Recent Labs  Lab 06/24/18 1114  TROPONINI 1.51*    Recent Labs  Lab 06/02/2018 1301  TROPIPOC 0.07     BNPNo results for input(s): BNP, PROBNP in the last 168 hours.   DDimer No results for input(s): DDIMER in the last 168 hours.   Lipid Panel     Component Value Date/Time   CHOL 142 05/13/2013 0932   TRIG 197.0 (H) 05/13/2013 0932   HDL 39.20 05/13/2013 0932   CHOLHDL 4 05/13/2013 0932   VLDL 39.4 05/13/2013 0932   LDLCALC 63 05/13/2013 0932     Radiology    Ct Head Wo Contrast  Result Date: 06/13/2018 CLINICAL DATA:  Altered level of consciousness. EXAM: CT HEAD WITHOUT CONTRAST TECHNIQUE: Contiguous axial images were obtained from the base of the skull through the vertex without intravenous contrast. COMPARISON:  None. FINDINGS: Brain: Mild atrophy. Negative for hydrocephalus. Negative for acute infarct, hemorrhage, mass Vascular: Negative for hyperdense vessel Skull: Negative Sinuses/Orbits: Patient is intubated. Paranasal sinuses clear. Normal orbit. Other: None IMPRESSION: No acute intracranial abnormality. Electronically Signed   By: Franchot Gallo M.D.   On: 05/31/2018 17:22   Ct Chest Wo Contrast  Result Date: 05/30/2018 CLINICAL DATA:  62 y/o  M; pulseless collapse with apnea post CPR. EXAM: CT CHEST WITHOUT CONTRAST TECHNIQUE: Multidetector CT imaging of the chest was performed following the standard protocol without IV contrast. COMPARISON:  04/03/2017 CT chest. 06/22/2018 chest radiograph. FINDINGS: Cardiovascular: Normal heart size. No pericardial effusion. Normal caliber thoracic aorta and main pulmonary artery. Coronary artery calcific atherosclerosis with RCA and LAD stents. Mild aortic calcific atherosclerosis. Left  central venous catheter tip extends to upper SVC. Mediastinum/Nodes: No enlarged mediastinal or axillary lymph nodes. Thyroid gland, trachea, and esophagus demonstrate no significant findings. Lungs/Pleura: Moderate bilateral pleural effusions. Diffuse consolidation greatest in right upper and left lower lobes. No pneumothorax. Upper Abdomen: Enteric tube tip extends to the gastric  body. Musculoskeletal: Left 2-7 acute nondisplaced mildly angulated left anterior rib fractures. 5-7 mildly displaced acute right lateral rib fracture. IMPRESSION: 1. Diffuse pulmonary consolidation greatest in right upper lobe and left lower lobe may represent multifocal pneumonia, pulmonary edema, or ARDS. 2. Moderate bilateral pleural effusions. 3. Left 2-7 nondisplaced mildly angulated left anterior rib fractures. 5-7 mildly displaced acute right lateral rib fracture. 4. Coronary artery and aortic calcific Electronically Signed   By: Kristine Garbe M.D.   On: 06/09/2018 17:31   Mr Brain Wo Contrast  Result Date: 06/24/2018 CLINICAL DATA:  62 y/o M; pulseless apneic cardiac arrest, proximally 30 minutes CPR, multiple additional arrest with ROSC. Encephalopathy. EXAM: MRI HEAD WITHOUT CONTRAST TECHNIQUE: Multiplanar, multiecho pulse sequences of the brain and surrounding structures were obtained without intravenous contrast. COMPARISON:  None. FINDINGS: Brain: No acute infarction, hemorrhage, hydrocephalus, extra-axial collection or mass lesion. Single nonspecific focus of T2 FLAIR hyperintense signal abnormality is present in right frontal periventricular white matter of unlikely significance for age. Moderate diffuse volume loss of the brain. Punctate focus of chronic microhemorrhage in right frontal periventricular white matter. Vascular: Normal flow voids. Skull and upper cervical spine: Normal marrow signal. Sinuses/Orbits: Negative. Other: None. IMPRESSION: 1. Mild motion artifact. 2. No acute intracranial  abnormality identified. 3. Moderate age advanced volume loss of the brain. Electronically Signed   By: Kristine Garbe M.D.   On: 06/24/2018 17:30   Dg Chest Port 1 View  Result Date: 06/24/2018 CLINICAL DATA:  Respiratory failure, history of cardiac arrest EXAM: PORTABLE CHEST 1 VIEW COMPARISON:  06/22/2018 FINDINGS: Cardiac shadow remains enlarged. Left jugular central line endotracheal tube and nasogastric catheter are again seen. The effusion seen previously are not as well appreciated due to the patient positioning. Diffuse bilateral parenchymal opacities are noted worst in the bases bilaterally. The upper lobes show better aeration although some persistent alveolar density remains. These changes likely represented diffuse edema with some slight improvement. IMPRESSION: Improved aeration in the upper lobes when compared with the prior exam. Persistent bibasilar changes are noted. Tubes and lines as described. Electronically Signed   By: Inez Catalina M.D.   On: 06/24/2018 09:00   Dg Chest Port 1 View  Result Date: 06/09/2018 CLINICAL DATA:  Central catheter placement.  Hypoxia. EXAM: PORTABLE CHEST 1 VIEW COMPARISON:  June 23, 2018 study obtained earlier in the day FINDINGS: Endotracheal tube tip is 4.9 cm above the carina. Nasogastric tube tip and side port are below the diaphragm. Central catheter tip is in the superior vena cava. No pneumothorax. There is widespread airspace consolidation throughout much of the right lung, stable. There is interstitial edema with perihilar interstitial edema. There is cardiomegaly with pulmonary venous hypertension. No adenopathy. No bone lesions. IMPRESSION: Tube and catheter positions as described without pneumothorax. Pulmonary vascular congestion with perihilar interstitial edema. Extensive airspace consolidation, likely pneumonia, throughout much of the right lung. Electronically Signed   By: Lowella Grip III M.D.   On: 06/22/2018 15:47   Dg  Chest Port 1 View  Result Date: 06/17/2018 CLINICAL DATA:  Post arrest. Status post endotracheal and esophagogastric tube placement. EXAM: PORTABLE CHEST 1 VIEW COMPARISON:  PA and lateral chest x-ray dated Dec 16, 2017 FINDINGS: The endotracheal tube tip lies at the inferior margin of the clavicular heads approximately 4 cm above the carina. The esophagogastric tube tip and proximal port project below the expected location of the GE junction. There is confluent airspace opacity throughout much of the right lung which is  new. The left lung exhibits mildly increased interstitial markings slightly more conspicuous overall. The pulmonary vascularity is engorged and indistinct. The cardiac silhouette remains enlarged. External pacemaker defibrillator pads are present. There are fractures of the lateral aspects of the right fifth through seventh ribs. IMPRESSION: Confluent airspace opacity throughout much of the right lung consistent with asymmetric pulmonary edema or other alveolar filling process. Coarse interstitial markings throughout the left lung without alveolar infiltrates. Mild cardiomegaly with pulmonary vascular congestion. Displaced fractures of the posterolateral aspects of the right fifth, sixth, and seventh ribs. No pneumothorax. The support tubes are in reasonable position. Electronically Signed   By: David  Martinique M.D.   On: 06/08/2018 13:28    Cardiac Studies  ECHO: 02/2017 - Left ventricle: The cavity size was mildly dilated. Systolic function was normal. Features are consistent with a pseudonormal left ventricular filling pattern, with concomitant abnormal relaxation and increased filling pressure (grade 2 diastolic dysfunction). Doppler parameters are consistent with elevated ventricular end-diastolic filling pressure. - Right ventricle: Systolic function was normal. - Tricuspid valve: There was mild regurgitation. - Pulmonary arteries: Systolic pressure was within the  normal range. - Inferior vena cava: The vessel was normal in size. - Pericardium, extracardiac: There was no pericardial effusion.  Impressions:  - Poor echo image quality even with echocontrast. LVEF estimated at 35-40% with hypokinesis in the mid and apical walls.  CATH: 12/30/2003 INDICATION: Mr. Cedano is a 62 year old gentleman who presented on Dec 26, 2003, with anterior myocardial infarction. Dr. Vicenta Aly performed drug- eluting stent placement in the culprit lesion of the LAD. He also had a severe stenosis of the proximal RCA. Patient now returns for planned staged intervention.  PROCEDURAL TECHNIQUE: Informed consent was obtained. Under 1% lidocaine local anesthesia, a 6 French sheath was placed in the right common femoral artery using the modified Seldinger technique. Anticoagulation was initiated with Bivalirudin. ACT was confirmed to be greater than 225 seconds. The patient had been maintained on Plavix since the time of his initial presentation.  A 6 [place] guide was advanced over a wire and engaged in the ostium of the RCA. A Luge wire was advanced to the distal vessel without difficulty. The distal portion of the lesion was directly stented using a 3.5 x 24 mm TAXUS at 18 atmospheres. The proximal portion of the lesion was then stented using an overlapping 3.5 x 20 mm TAXUS also deployed at 18 atmospheres. The entirety of both stents was then post dilated using a 3.75 mm power sail balloon at 18 atmospheres. Careful attention was paid to the region of overlap. The patient tolerated the procedure well and was transferred to the holding room in stable condition. Bivalirudin infusion was discontinued at the end of the case.  COMPLICATIONS: None.  IMPRESSION/PLAN: Successful drug-eluting stent placement reducing stenosis from 80% to 0% in the mid RCA. Patient will be maintained on Plavix for at least a year.  The sheath will be removed two hours after completion of the procedure.   ------------------------------------------------------------------- 06/24/2018 ECHO Study Conclusions  - Left ventricle: The cavity size was normal. Systolic function was   severely reduced. The estimated ejection fraction was in the   range of 20% to 25%. Diffuse hypokinesis. Although no diagnostic   regional wall motion abnormality was identified, this possibility   cannot be completely excluded on the basis of this study.   Features are consistent with a pseudonormal left ventricular   filling pattern, with concomitant abnormal relaxation and   increased filling  pressure (grade 2 diastolic dysfunction).   Acoustic contrast opacification revealed no evidence ofthrombus. - Mitral valve: There was mild regurgitation. - Left atrium: The atrium was mildly to moderately dilated. - Atrial septum: No defect or patent foramen ovale was identified. - Pulmonic valve: There was mild regurgitation.  Impressions:  - Echo contrast used to define wall motion and exclude thrombus,   but still technically difficult study. EF appears severely   reduced with diffuse hypokinesis, base appears to have slightly   better function than apex.   Patient Profile     Lawrence Delgado is a 62 y.o. 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  Assessment & Plan    1. Day 2 s/p VF cardiac arrest; on amiodarone drip  2. Cardiogenic shock: on neosynephrine: BP now in the upper 80s; will try resuming low dose  levophed;    3. CAD: h/o prior anterior MI with PCI to LAD and RCA   Will need ischemic evaluation if stabilizes.  4. Ischemic cardiomyopathy: EF 04/2015 35-40%;  Now 20 - 25% post arrest.  5. Respiratory failure: on vent per CCM  6.  AKI: Cr increased to 3.32 today  7. Encephalopathy;  MRI with acute findings.  Opening eyes to voice; mild nystagmus  ? EEG  8. H/o PAF; maintaining sinus rhythm, was on eliquis  9. K 3.1; need to replete;  Mg 1.8 Will follow-up LFTs post arrest  Discussed with family members  CC time spent: 40 minutes  Signed, Troy Sine, MD, [facility] 06/25/2018, 8:46 AM

## 2018-06-25 NOTE — Consult Note (Signed)
Neurology Consultation Reason for Consult: Altered mental status Referring Physician: Molli Knock, W  CC: Altered mental status  History is obtained from: Chart review  HPI: Lawrence Delgado is a 62 y.o. male who arrested on 11/26 and did not undergo cooling protocol.  He continues to be encephalopathic.  MRI was negative, EEG with diffuse slow activity and triphasic waves.  The cause of his arrest was thought to be primary arrhythmia.  He also has AKI with oliguria.   Apparently he has been having intermittent bouts of nystagmus.  ROS: Unable to obtain due to altered mental status.   Past Medical History:  Diagnosis Date  . Arthritis    " In my knees   . Atrial fibrillation (HCC) 02/2017  . CAD (coronary artery disease)   . CHF (congestive heart failure) (HCC)   . DM (diabetes mellitus) (HCC)    insulin dependent  . GERD (gastroesophageal reflux disease)   . HLD (hyperlipidemia)   . HTN (hypertension)   . MI (myocardial infarction) (HCC)   . Obesity   . Sleep apnea      Family History  Problem Relation Age of Onset  . Heart attack Mother   . Heart attack Father      Social History:  reports that he quit smoking about 14 years ago. He has a 75.00 pack-year smoking history. He has never used smokeless tobacco. He reports that he does not drink alcohol or use drugs.   Exam: Current vital signs: BP (!) 101/59   Pulse (!) 52   Temp 98.8 F (37.1 C)   Resp 15   Ht 6\' 1"  (1.854 m)   Wt (!) 139.2 kg   SpO2 97%   BMI 40.49 kg/m  Vital signs in last 24 hours: Temp:  [98.8 F (37.1 C)-100.4 F (38 C)] 98.8 F (37.1 C) (11/28 1700) Pulse Rate:  [32-95] 52 (11/28 1700) Resp:  [11-26] 15 (11/28 1700) BP: (82-163)/(44-86) 101/59 (11/28 1700) SpO2:  [88 %-99 %] 97 % (11/28 1700) Arterial Line BP: (76-181)/(32-110) 100/47 (11/28 1700) FiO2 (%):  [50 %] 50 % (11/28 1600) Weight:  [139.2 kg] 139.2 kg (11/28 0500)   Physical Exam  Constitutional: Appears well-developed  and well-nourished.  Psych: Affect appropriate to situation Eyes: No scleral injection HENT: No OP obstrucion Head: Normocephalic.  Cardiovascular: Normal rate and regular rhythm.  Respiratory: Effort normal, non-labored breathing GI: Soft.  No distension. There is no tenderness.  Skin: WDI  Neuro: Mental Status: Patient is obtunded, eyes partially open to noxious [place]imline, does not follow commands  cranial Nerves: II: Does not blink to threat pupils are equal, round, and reactive to light.   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  The description of the intermittent episodes of nystagmus are concerning for intermittent seizures and the EEG that was performed demonstrated triphasic waves which typically are more representative of metabolic encephalopathy, but can represent cortical irritability.  I would favor starting an antiepileptic medication and continuous monitoring to assess for intermittent seizures.  Recommendations: 1) Depakote 2500 mg x 1, then 500 3 times daily 2) continuous  EEG  This patient is critically ill and at significant risk of neurological worsening, death and care requires constant monitoring of vital signs, hemodynamics,respiratory and cardiac monitoring, neurological assessment, discussion with family, other specialists and medical decision making of high complexity. I spent 40 minutes of neurocritical care time  in the care of  this patient.  Ritta SlotMcNeill , MD [facility] [phone]  If 7pm-  7am, please page neurology on call as listed in AMION. 06/25/2018  6:45 PM

## 2018-06-25 NOTE — Progress Notes (Signed)
LTM hooked up and running. No initial skin breakdown. 

## 2018-06-25 NOTE — Progress Notes (Signed)
Pharmacy Antibiotic Note  Lawrence GearingJoseph R Delgado is a 62 y.o. male admitted on 2017/10/01 with pneumonia.  Pharmacy has been consulted for Unasyn dosing. Tmax 100.6 and WBC decreased  26.4>10.6. Renal function continues to decline, SCr 1.93 >> 3.32.  Plan: Decrease Unasyn 3gm IV Q8H F/u renal fxn, C&S, clinical status   Height: 6\' 1"  (185.4 cm) Weight: (!) 306 lb 14.1 oz (139.2 kg) IBW/kg (Calculated) : 79.9  Temp (24hrs), Avg:99.7 F (37.6 C), Min:98.8 F (37.1 C), Max:100.6 F (38.1 C)  Recent Labs  Lab 06-14-2018 1259  06-14-2018 1305  06-14-2018 1650 06-14-2018 2042 06-14-2018 2219 06/24/18 0407 06/24/18 1114 06/24/18 1400 06/25/18 0440  WBC 26.4*  --   --   --   --  21.4*  --  18.3*  --   --  10.6*  CREATININE 1.99*   < >  --    < >  --   --  2.51* 2.85* 2.76* 2.91* 3.32*  LATICACIDVEN  --   --  7.70*  --  4.78*  --   --   --   --   --   --    < > = values in this interval not displayed.    Estimated Creatinine Clearance: 34.7 mL/min (A) (by C-G formula based on SCr of 3.32 mg/dL (H)).    Allergies  Allergen Reactions  . Codeine Nausea And Vomiting  . Clopidogrel Bisulfate Itching and Rash    Antimicrobials this admission: Unasyn 11/26>> Zosyn x 1 11/26  Dose adjustments this admission: 11/28 Decr 3g q8h  Microbiology results: 11/26 BCx: NGx2 days 11/26 MRSA: negative  Thank you for allowing pharmacy to be a part of this patient's care.  Marcelino FreestoneEmily Taleah Bellantoni, PharmD PGY2 Cardiology Pharmacy Resident Phone [phone] Please check AMION for all Pharmacist numbers by unit 06/25/2018 1:14 PM

## 2018-06-26 LAB — BASIC METABOLIC PANEL
Anion gap: 12 (ref 5–15)
BUN: 58 mg/dL — AB (ref 6–20)
CO2: 23 mmol/L (ref 22–32)
Calcium: 8.1 mg/dL — ABNORMAL LOW (ref 8.9–10.3)
Chloride: 103 mmol/L (ref 98–111)
Creatinine, Ser: 4.25 mg/dL — ABNORMAL HIGH (ref 0.61–1.24)
GFR calc Af Amer: 16 mL/min — ABNORMAL LOW (ref >60)
GFR calc non Af Amer: 14 mL/min — ABNORMAL LOW (ref >60)
Glucose, Bld: 181 mg/dL — ABNORMAL HIGH (ref 70–99)
Potassium: 3.4 mmol/L — ABNORMAL LOW (ref 3.5–5.1)
Sodium: 138 mmol/L (ref 135–145)

## 2018-06-26 LAB — GLUCOSE, CAPILLARY
GLUCOSE-CAPILLARY: 194 mg/dL — AB (ref 70–99)
GLUCOSE-CAPILLARY: 163 mg/dL — AB (ref 70–99)
Glucose-Capillary: 129 mg/dL — ABNORMAL HIGH (ref 70–99)
Glucose-Capillary: 135 mg/dL — ABNORMAL HIGH (ref 70–99)
Glucose-Capillary: 139 mg/dL — ABNORMAL HIGH (ref 70–99)
Glucose-Capillary: 143 mg/dL — ABNORMAL HIGH (ref 70–99)
Glucose-Capillary: 146 mg/dL — ABNORMAL HIGH (ref 70–99)
Glucose-Capillary: 150 mg/dL — ABNORMAL HIGH (ref 70–99)
Glucose-Capillary: 158 mg/dL — ABNORMAL HIGH (ref 70–99)
Glucose-Capillary: 159 mg/dL — ABNORMAL HIGH (ref 70–99)
Glucose-Capillary: 171 mg/dL — ABNORMAL HIGH (ref 70–99)
Glucose-Capillary: 173 mg/dL — ABNORMAL HIGH (ref 70–99)
Glucose-Capillary: 183 mg/dL — ABNORMAL HIGH (ref 70–99)
Glucose-Capillary: 185 mg/dL — ABNORMAL HIGH (ref 70–99)
Glucose-Capillary: 188 mg/dL — ABNORMAL HIGH (ref 70–99)
Glucose-Capillary: 203 mg/dL — ABNORMAL HIGH (ref 70–99)
Glucose-Capillary: 221 mg/dL — ABNORMAL HIGH (ref 70–99)

## 2018-06-26 LAB — HEPATIC FUNCTION PANEL
ALT: 59 U/L — ABNORMAL HIGH (ref 0–44)
AST: 44 U/L — ABNORMAL HIGH (ref 15–41)
Albumin: 2.1 g/dL — ABNORMAL LOW (ref 3.5–5.0)
Alkaline Phosphatase: 42 U/L (ref 38–126)
Bilirubin, Direct: 0.3 mg/dL — ABNORMAL HIGH (ref 0.0–0.2)
Indirect Bilirubin: 0.4 mg/dL (ref 0.3–0.9)
Total Bilirubin: 0.7 mg/dL (ref 0.3–1.2)
Total Protein: 5.7 g/dL — ABNORMAL LOW (ref 6.5–8.1)

## 2018-06-26 LAB — PHOSPHORUS: Phosphorus: 3.4 mg/dL (ref 2.5–4.6)

## 2018-06-26 LAB — MAGNESIUM: Magnesium: 2.4 mg/dL (ref 1.7–2.4)

## 2018-06-26 LAB — VALPROIC ACID LEVEL: Valproic Acid Lvl: 65 ug/mL (ref 50.0–100.0)

## 2018-06-26 MED ORDER — LEVETIRACETAM IN NACL 1000 MG/100ML IV SOLN
1000.0000 mg | Freq: Two times a day (BID) | INTRAVENOUS | Status: DC
  Administered 2018-06-26 – 2018-06-28 (×4): 1000 mg via INTRAVENOUS
  Filled 2018-06-26 (×4): qty 100

## 2018-06-26 MED ORDER — POTASSIUM CHLORIDE 20 MEQ/15ML (10%) PO SOLN
40.0000 meq | Freq: Once | ORAL | Status: AC
  Administered 2018-06-26: 40 meq
  Filled 2018-06-26: qty 30

## 2018-06-26 MED ORDER — FAMOTIDINE 40 MG/5ML PO SUSR
20.0000 mg | Freq: Every day | ORAL | Status: DC
  Administered 2018-06-27 – 2018-06-29 (×3): 20 mg
  Filled 2018-06-26 (×3): qty 2.5

## 2018-06-26 MED ORDER — LORAZEPAM 2 MG/ML IJ SOLN
INTRAMUSCULAR | Status: AC
  Administered 2018-06-26: 2 mg via INTRAVENOUS
  Filled 2018-06-26: qty 1

## 2018-06-26 MED ORDER — LORAZEPAM 2 MG/ML IJ SOLN
2.0000 mg | Freq: Once | INTRAMUSCULAR | Status: AC
  Administered 2018-06-26: 2 mg via INTRAVENOUS

## 2018-06-26 MED ORDER — INSULIN ASPART 100 UNIT/ML ~~LOC~~ SOLN
8.0000 [IU] | SUBCUTANEOUS | Status: DC
  Administered 2018-06-26 – 2018-06-28 (×9): 8 [IU] via SUBCUTANEOUS

## 2018-06-26 MED ORDER — INSULIN ASPART 100 UNIT/ML ~~LOC~~ SOLN
0.0000 [IU] | SUBCUTANEOUS | Status: DC
  Administered 2018-06-26 – 2018-06-27 (×4): 3 [IU] via SUBCUTANEOUS
  Administered 2018-06-29: 2 [IU] via SUBCUTANEOUS

## 2018-06-26 MED ORDER — POTASSIUM CHLORIDE 20 MEQ/15ML (10%) PO SOLN
40.0000 meq | Freq: Two times a day (BID) | ORAL | Status: DC
  Filled 2018-06-26: qty 30

## 2018-06-26 MED ORDER — INSULIN REGULAR(HUMAN) IN NACL 100-0.9 UT/100ML-% IV SOLN: INTRAVENOUS | Status: DC

## 2018-06-26 MED ORDER — LORAZEPAM 2 MG/ML IJ SOLN
2.0000 mg | Freq: Once | INTRAMUSCULAR | Status: AC
  Administered 2018-06-26: 2 mg via INTRAVENOUS
  Filled 2018-06-26: qty 1

## 2018-06-26 MED ORDER — INSULIN DETEMIR 100 UNIT/ML ~~LOC~~ SOLN
25.0000 [IU] | Freq: Two times a day (BID) | SUBCUTANEOUS | Status: DC
  Administered 2018-06-26 – 2018-06-29 (×7): 25 [IU] via SUBCUTANEOUS
  Filled 2018-06-26 (×7): qty 0.25

## 2018-06-26 NOTE — Procedures (Signed)
LTM-EEG Report  HISTORY: Continuous video-EEG monitoring performed for 62 year old with nystagmus, possible anoxic injury.  ACQUISITION: International 10-20 system for electrode placement; 18 channels with additional eyes linked to ipsilateral ears and EKG. Additional T1-T2 electrodes were used. Continuous video recording obtained.   EEG NUMBER:  MEDICATIONS:  Day 1:  See EMR  DAY #1: from 1431 06/25/18 to 0730 06/26/18   BACKGROUND: An overall medium voltage continuous recording with poor spontaneous variability and reactivity. The background consisted of frequent theta-delta activity with sparse superimposed faster frequencies. State changes were seen, however no clear stage II sleep architecture was observed. Some periods of attenuation were observed in rest.  EPILEPTIFORM/PERIODIC ACTIVITY: There were occasional prolonged runs of GPDs or GRDA with 1-2Hz  frequency, occurring with arousal, without clear ictal evolution. These improved after administration of lorazepam, however they were still present in a shorter, fragmented form. SEIZURES: Electrographic seizures with GPD/GRDA as above, improved with lorazepam. EVENTS: One clinical event of eye opening and possible nystagmus occurring with runs of GPDs as above.  EKG: no significant arrhythmia  SUMMARY: This was an abnormal continuous video EEG due to generalized background slowing and runs of diffuse epileptiform discharges during arousal with possible clinical correlate. These could represent electrographic or subtle seizures. These runs of periodic activity improved with lorazepam. These were only present with stimulation (SIRPIDs).

## 2018-06-26 NOTE — Progress Notes (Addendum)
Subjective: Continues to have generalized periodic discharges on EEG.  Yesterday, nursing pushed the button for an episode of eye-opening, there is a description of nystagmus that I do not see this when I examined the patient.  This markedly improved with Ativan, that the patient's clinical status did not change.  Exam: Vitals:   06/26/18 0700 06/26/18 0800  BP: 115/64 128/86  Pulse: (!) 44 60  Resp: 14 (!) 21  Temp: 97.7 F (36.5 C) 97.9 F (36.6 C)  SpO2: 99% 98%   Gen: In bed, NAD Resp: non-labored breathing, no acute distress Abd: soft, nt  Neuro: MS: Opens eyes to noxious stimulus CN: Pupils equal reactive, corneals are intact Motor: Extensor posturing to bilateral upper extremity stimulation, he appears to withdrawal bilateral lower extremity Sensory: As above  Pertinent Labs: Valproic acid level 65  Impression: 62 year old male with anoxic encephalopathy status post cardiac arrest.  There is a question of seizures on his EEG, though I do not think this is definite, he is being treated with Keppra and Depakote.  I would continue this for the time being.  Recommendations: 1) increase Keppra to 1 g twice daily 2) continue Depakote 500 3 times daily 3) Depacon level again tomorrow 4) continue LTM EEG  This patient is critically ill and at significant risk of neurological worsening, death and care requires constant monitoring of vital signs, hemodynamics,respiratory and cardiac monitoring, neurological assessment, discussion with family, other specialists and medical decision making of high complexity. I spent 40 minutes of neurocritical care time  in the care of  this patient.  Ritta SlotMcNeill Ishaan Villamar, MD [facility] [phone]  If 7pm- 7am, please page neurology on call as listed in AMION. 06/26/2018  8:54 AM

## 2018-06-26 NOTE — Progress Notes (Signed)
Inpatient Diabetes Program Recommendations  AACE/ADA: New Consensus Statement on Inpatient Glycemic Control (2015)  Target Ranges:  Prepandial:   less than 140 mg/dL      Peak postprandial:   less than 180 mg/dL (1-2 hours)      Critically ill patients:  140 - 180 mg/dL   Lab Results  Component Value Date   GLUCAP 143 (H) 06/26/2018   HGBA1C 10.7 (H) 03/04/2017    Review of Glycemic ControlResults for Lawrence GearingVERNON, Lawrence R (MRN [id number]) as of 06/26/2018 10:03  Ref. Range 06/26/2018 04:33 06/26/2018 06:02 06/26/2018 07:24 06/26/2018 08:13 06/26/2018 09:18  Glucose-Capillary Latest Ref Range: 70 - 99 mg/dL 433173 (H)  9.7 units/hr 295135 (H)  5.2 units/hr 171 (H)  7.7 units/hr 150 (H)  6.2 units/hr 143 (H)  5.7 units/hr    Diabetes history: DM2 Outpatient Diabetes medications:  Novolog 70/30 50 units bid Current orders for Inpatient glycemic control: IV insulin Lantus 40 units bid, Novolog 3-6-9 q 4 hours Inpatient Diabetes Program Recommendations:    Patient currently on insulin drip and has not been receiving SQ insulin.  Blood sugars currently within goal.  If transition off insulin drip will occur today, consider Levemir 25 units bid, Novolog standard correction and Novolog 8 units q 4 hours (tube feed coverage).    Thanks,  Beryl MeagerJenny Aislynn Cifelli, RN, BC-ADM Inpatient Diabetes Coordinator Pager [phone] (8a-5p)

## 2018-06-26 NOTE — Progress Notes (Signed)
Nutrition Follow-up  DOCUMENTATION CODES:   Obesity unspecified  INTERVENTION:   Tube feeding via OG tube: - Change formula to Vital High Protein @ 55 ml/hr (1320 ml/day) - Pro-stat 60 ml BID  - d/c Glucerna 1.2 formula  Note: tube feeding regimen provides 156 grams of carbohydrates daily. To meet pt's needs with Glucerna 1.2 formula (35 ml/hr) and Pro-stat (60 ml QID), tube feeding regimen would provide 176 grams of carbohydrates daily.  Tube feeding regimen provides 1720 kcal, 176 grams of protein, and 1109 ml of H2O (100% of needs).  NUTRITION DIAGNOSIS:   Inadequate oral intake related to inability to eat as evidenced by NPO status.  Ongoing  GOAL:   Provide needs based on ASPEN/SCCM guidelines  Met via new TF regimen  MONITOR:   Vent status, Labs, I & O's, Weight trends, TF tolerance  REASON FOR ASSESSMENT:   Ventilator, Consult Enteral/tube feeding initiation and management  ASSESSMENT:   62 year old male who presented to the ED on 11/26 after suffering a cardiac arrest at his PCP office. CPR initiated by office staff for approximately 30 minutes. Pt experienced multiple arrests mixed with ROSC. Pt was intubated by EMS and small amount of blood found in ET tube in ED. PMH significant for CAD, CHF, IDDM, GERD, hyperlipidemia, hypertension, and prior MI.  Weight on admission was 125.1 kg. Weight up 18 kg and pt is +9.6 L since admit. Will continue to use this weight to estimate pt's needs given positive fluid balance.  Discussed pt with RN.  Family at bedside. Per pt's wife, pt ate 2 meals daily PTA. Breakfast included 2 scrambled eggs, toast, and sausage. Pt skipped lunch. Supper included a bologna sandwich. Pt drank soda and water throughout the day and snacked on cookies, cake, and candy.  Pt's wife states that pt has not lost weight recently and that pt's weight fluctuates based on fluid status.  Patient is currently intubated on ventilator support MVe: 9.7  L/min Temp (24hrs), Avg:98.5 F (36.9 C), Min:97.5 F (36.4 C), Max:99.1 F (37.3 C) BP: 114/64 MAP: 79  NS/KVO: 20 ml/hr Amiodarone: 16.7 ml/hr Regular insulin: 15.7 ml/hr Levophed: 37.5 ml/hr  Current tube feeding: Glucerna 1.2 @ 55 ml/hr, Pro-stat 60 ml BID (provides 1984 kcal, 139 grams of protein, and 191 grams of carbohydrate daily)  Note: Vital High Protein and Pro-stat tube feeding regimen provides 156 grams of carbohydrates daily. To meet pt's needs with Glucerna 1.2 formula (35 ml/hr) and Pro-stat (60 ml QID), tube feeding regimen would provide 176 grams of carbohydrates daily.  Medications reviewed and include: Pepcid, SSI, Lantus 40 units daily, IV antibiotics, IV Keppra, IV Depacon  Labs reviewed: potassium 3.4 (L), BUN 58 (H), creatinine 4.25 (H), elevated LFTs CBG's: 150, 171, 135, 173, 183, 188, 194, 203, 170 x 12 hours  UOP: 1260 ml x 24 hours I/O's: +9.6 L since admission  Diet Order:   Diet Order            Diet NPO time specified  Diet effective now              EDUCATION NEEDS:   Not appropriate for education at this time  Skin:  Skin Assessment: Reviewed RN Assessment  Last BM:  11/28 (smear type 6)  Height:   Ht Readings from Last 1 Encounters:  06/14/2018 '6\' 1"'  (1.854 m)    Weight:   Wt Readings from Last 1 Encounters:  06/26/18 (!) 143.2 kg    Ideal Body Weight:  83.64  kg  BMI:  Body mass index is 41.65 kg/m.  Estimated Nutritional Needs:   Kcal:  9179-1505  Protein:  167-182 grams  Fluid:  >/= 1.5 L or per MD    Gaynell Face, MS, RD, LDN Inpatient Clinical Dietitian Pager: [phone] Weekend/After Hours: [phone]

## 2018-06-26 NOTE — Progress Notes (Signed)
NAME:  Lawrence GearingJoseph R Delgado, MRN:  [id number], DOB:  1955/08/28, LOS: 3 ADMISSION DATE:  05/23/18, CONSULTATION DATE:  2018/05/02 REFERRING MD:  Dr. Charm BargesButler, CHIEF COMPLAINT:  Cardiac Arrest    Brief History   62 y/o M who presented to [facility] on [facility]/26 after suffering a cardiac arrest.    Estimated time to ROSC 30 minutes.  Past Medical History  OSA, Obesity, MI, x5 stents, HTN, CHF, CAD, HLD, AF on Eliquis GERD DM   Significant Hospital Events   11/26 Admit started on normothermia, not candidate for hypothermia due to prolonged downtime and fever norepinephrine and vasopressin initiated due to cardiogenic shock, EEG ordered , Seen by cardiology noted to having underlying ischemic cardiomyopathy 11/27: Periodic discharges with triphasic morphology, concerning for possible generalized hypoxic injury 11/28: Neurology consultation without acute abnormality.Started on anticonvulsants for concerns for possible seizures 11/29: No improvement neurological exam.  Weaning pressors, serum creatinine climbing.  May DO NOT RESUSCITATE with no further escalation, family informed he is not a dialysis candidate Consults:    Procedures:  ETT 11/26 >>  L IJ TLC 11/26 >>   Significant Diagnostic Tests:  11/26  Admit after cardiac arrest 11/27 MRI brain: 1. Mild motion artifact.2. No acute intracranial abnormality identified. 3. Moderate age advanced volume loss of the brain. Micro Data:  BCx2 11/26 >>  Tracheal Aspirate 11/26 >>   Antimicrobials:  Unasyn 11/26 >>    Interim history/subjective:  No sig improvement.  Remains unresponsive, questionable seizure activity  Objective   Blood pressure 128/86, pulse (Abnormal) 46, temperature 97.7 F (36.5 C), resp. rate 14, height 6\' 1"  (1.854 m), weight (Abnormal) 143.2 kg, SpO2 99 %. CVP:  [12 mmHg-18 mmHg] 13 mmHg  Vent Mode: PRVC FiO2 (%):  [50 %] 50 % Set Rate:  [14 bmp] 14 bmp Vt Set:  [640 mL-650 mL] 640 mL PEEP:  [8 cmH20] 8 cmH20 Plateau  Pressure:  [25 cmH20-27 cmH20] 25 cmH20   Intake/Output Summary (Last 24 hours) at 06/26/2018 0958 Last data filed at 06/26/2018 0700 Gross per 24 hour  Intake 2377.08 ml  Output 1220 ml  Net 1157.08 ml   Filed Weights   06/24/18 0500 06/25/18 0500 06/26/18 0440  Weight: 135.7 kg (Abnormal) 139.2 kg (Abnormal) 143.2 kg    Examination:  General: Comatose 62 year old white male currently on full ventilator support HEENT normocephalic atraumatic orally intubated no JVD mucous membranes moist Neuro: GCS 4.  Possibly some decerebrate posturing noted on left to noxious stimulus Pulmonary: Scattered rhonchi equal chest rise Cardiac: Regular rate and rhythm Abdomen: Soft nontender Extremities: Warm and dry trace edema GU: Clear yellow   Resolved Hospital Problem list      Assessment & Plan:   Cardiac Arrest c/b post resuscitation cardiogenic shock H/o HTN, HLD, CAD s/p stent, CHF EF now declined to 20 to 25% with global hypokinesis -CPR performed by bystanders, approximately 30+ minutes total CPR Plan telemetry monitoring Wean vasoactive drips for mean arterial pressure greater than 65 Keep euvolemic DC hydrocortisone  Witnessed VT 11/27 and h/o  atrial fibrillation  Plan Amiodarone infusion Holding anticoagulation given concern for bleeding on admit  Cont tele       Heparin per pharmacy to start 11/30 if hgb stable another 24 hrs  Acute Hypoxemic Respiratory Failure going cardiac arrest complicated by probable aspiration pneumonia Portable chest x-ray personally reviewed on 11/27: Demonstrates bilateral right greater than left airspace disease, cannot exclude element of edema, aeration had improved when comparing to admitting film  Plan continuing full ventilator support Weaning PEEP and FiO2 PAD protocol, RASS goal 0 Day #4 of 7 Unasyn VAP bundle  Acute Anoxic/Metabolic Encephalopathy w/ possible seizures on cont EEG -post arrest  Plan Continuing supportive  care Anticonvulsants and continuous EEG as directed by neurology Serial neuro checks  AKI -Serum creatinine continues to climb, urine output okay. Plan Avoid hypotension Renal dose medications Strict intake output Will discuss with family, given his poor prognosis I do not think he is a dialysis candidate  Fluid and Electrolyte imbalance: hypokalemia  Plan  replace and recheck as indicated  GERD Plan H2B  DM w/ hyperglycemia  Plan ssi w/ basal dosing   Family updated bedside  Best practice:  Diet: TF Pain/Anxiety/Delirium protocol (if indicated): PRN fentanyl / versed  VAP protocol (if indicated): ordered  DVT prophylaxis: SCD's [place] heparin for now GI prophylaxis: Pepcid  Glucose control: SSI  Mobility: bedrest  Code Status: DNR Family Communication: Long discussion about prognosis with wife on 11/29.  Plan will be to continue supportive care over the weekend, confirmed DO NOT RESUSCITATE.  If no significant clinical improvement by Monday family anticipating transitioning to comfort approach.  The patient clearly defined prior to his illness he would not want to live in skilled nursing facility nor be supported indefinitely if there is no hope for recovery  Disposition: Remains critically ill, titrating hemodynamic drips, continuing EEG monitoring, continuing supportive care.  We will continue current therapy over the course of the weekend and revisit neurological progress as of Monday  Simonne Martinet ACNP-BC [facility] Pulmonary/Critical Care Pager # [phone] OR # [phone] if no answer

## 2018-06-26 NOTE — Progress Notes (Addendum)
Progress Note  Patient Name: Lawrence Delgado Date of Encounter: 06/26/2018  Primary Cardiologist: Kirk Ruths, MD   Subjective   Intubated. Respond to stimuli, does not open eyes  Inpatient Medications    Scheduled Meds: . chlorhexidine gluconate (MEDLINE KIT)  15 mL Mouth Rinse BID  . famotidine  20 mg Per Tube BID  . feeding supplement (PRO-STAT SUGAR FREE 64)  60 mL Per Tube BID  . hydrocortisone sodium succinate  25 mg Intravenous Q6H  . insulin aspart  3-9 Units Subcutaneous Q4H  . insulin glargine  40 Units Subcutaneous BID  . mouth rinse  15 mL Mouth Rinse 10 times per day   Continuous Infusions: . sodium chloride    . sodium chloride 250 mL (06/21/2018 1332)  . sodium chloride 20 mL/hr at 06/26/18 0700  . sodium chloride    . amiodarone 30 mg/hr (06/26/18 0700)  . ampicillin-sulbactam (UNASYN) IV 3 g (06/26/18 0815)  . feeding supplement (VITAL HIGH PROTEIN) 1,000 mL (06/24/18 1737)  . insulin 5.2 mL/hr at 06/26/18 0700  . lactated ringers Stopped (06/25/18 1713)  . levETIRAcetam    . norepinephrine (LEVOPHED) Adult infusion 12 mcg/min (06/26/18 0243)  . phenylephrine (NEO-SYNEPHRINE) Adult infusion 100 mcg/min (06/24/18 0720)  . valproate sodium 500 mg (06/26/18 0916)  . vasopressin (PITRESSIN) infusion - *FOR SHOCK* Stopped (06/25/18 0422)   PRN Meds: Place/Maintain arterial line **AND** sodium chloride, acetaminophen, docusate, ondansetron (ZOFRAN) IV   Vital Signs    Vitals:   06/26/18 0600 06/26/18 0700 06/26/18 0800 06/26/18 0847  BP: 111/62 115/64 128/86 128/86  Pulse: (!) 43 (!) 44 60 (!) 46  Resp: 14 14 (!) 21 14  Temp: 97.7 F (36.5 C) 97.7 F (36.5 C) 97.9 F (36.6 C) 97.7 F (36.5 C)  TempSrc:      SpO2: 99% 99% 98% 99%  Weight:      Height:        Intake/Output Summary (Last 24 hours) at 06/26/2018 1001 Last data filed at 06/26/2018 0700 Gross per 24 hour  Intake 2294.05 ml  Output 1220 ml  Net 1074.05 ml   Filed Weights   06/24/18 0500 06/25/18 0500 06/26/18 0440  Weight: 135.7 kg (!) 139.2 kg (!) 143.2 kg    Telemetry    Severe atrial bradycardia vs junctional rhythm - Personally Reviewed  ECG    Junctional rhythm - Personally Reviewed  Physical Exam   GEN: intubated, on TPN.   Neck: No JVD Cardiac: bradycardic, no significant murmur Respiratory: intubated GI: Soft, nontender, non-distended  MS: No edema; No deformity. Neuro:  respond to stimuli, does not open eye Psych: Unable to assess  Labs    Chemistry Recent Labs  Lab 06/04/2018 1259  06/25/18 0440 06/25/18 1629 06/26/18 0437  NA 138   < > 136 138 138  K 2.2*   < > 3.1* 3.3* 3.4*  CL 95*   < > 102 103 103  CO2 30   < > _0 GLUCOSE 398*   < > 146* 146* 181*  BUN 11   < > 37* 47* 58*  CREATININE 1.99*   < > 3.32* 3.68* 4.25*  CALCIUM 8.0*   < > 7.7* 7.8* 8.1*  PROT 6.8  --   --   --  5.7*  ALBUMIN 2.8*  --   --   --  2.1*  AST 50*  --   --   --  44*  ALT 18  --   --   --  59*  ALKPHOS 54  --   --   --  42  BILITOT 0.9  --   --   --  0.7  GFRNONAA 35*   < > 19* 17* 14*  GFRAA 41*   < > 22* 20* 16*  ANIONGAP 13   < > _0 < > = values in this interval not displayed.     Hematology Recent Labs  Lab 06/21/2018 2042 06/24/18 0407 06/25/18 0440  WBC 21.4* 18.3* 10.6*  RBC 4.58 4.40 3.88*  HGB 13.6 13.2 11.6*  HCT 40.1 38.0* 34.0*  MCV 87.6 86.4 87.6  MCH 29.7 30.0 29.9  MCHC 33.9 34.7 34.1  RDW 12.9 12.9 13.6  PLT 273 230 143*    Cardiac Enzymes Recent Labs  Lab 06/24/18 1114  TROPONINI 1.51*    Recent Labs  Lab 06/27/2018 1301  TROPIPOC 0.07     BNPNo results for input(s): BNP, PROBNP in the last 168 hours.   DDimer No results for input(s): DDIMER in the last 168 hours.   Radiology    Mr Brain Wo Contrast  Result Date: 06/24/2018 CLINICAL DATA:  62 y/o M; pulseless apneic cardiac arrest, proximally 30 minutes CPR, multiple additional arrest with ROSC. Encephalopathy. EXAM: MRI HEAD WITHOUT  CONTRAST TECHNIQUE: Multiplanar, multiecho pulse sequences of the brain and surrounding structures were obtained without intravenous contrast. COMPARISON:  None. FINDINGS: Brain: No acute infarction, hemorrhage, hydrocephalus, extra-axial collection or mass lesion. Single nonspecific focus of T2 FLAIR hyperintense signal abnormality is present in right frontal periventricular white matter of unlikely significance for age. Moderate diffuse volume loss of the brain. Punctate focus of chronic microhemorrhage in right frontal periventricular white matter. Vascular: Normal flow voids. Skull and upper cervical spine: Normal marrow signal. Sinuses/Orbits: Negative. Other: None. IMPRESSION: 1. Mild motion artifact. 2. No acute intracranial abnormality identified. 3. Moderate age advanced volume loss of the brain. Electronically Signed   By: Kristine Garbe M.D.   On: 06/24/2018 17:30   Dg Chest Port 1 View  Result Date: 06/25/2018 CLINICAL DATA:  Respiratory failure EXAM: PORTABLE CHEST 1 VIEW COMPARISON:  06/24/2018 FINDINGS: Cardiac shadow remains enlarged. Endotracheal tube, nasogastric catheter and left jugular central line are again seen. Bibasilar atelectasis is noted. Continued improving aeration is noted within both lungs. Some mild residual vascular congestion remains. IMPRESSION: Tubes and lines as described above. Bibasilar opacities with overall improved aeration bilaterally. Electronically Signed   By: Inez Catalina M.D.   On: 06/25/2018 09:42    Cardiac Studies   Echo 06/24/2018 LV EF: 20% -   25% Study Conclusions  - Left ventricle: The cavity size was normal. Systolic function was   severely reduced. The estimated ejection fraction was in the   range of 20% to 25%. Diffuse hypokinesis. Although no diagnostic   regional wall motion abnormality was identified, this possibility   cannot be completely excluded on the basis of this study.   Features are consistent with a pseudonormal  left ventricular   filling pattern, with concomitant abnormal relaxation and   increased filling pressure (grade 2 diastolic dysfunction).   Acoustic contrast opacification revealed no evidence ofthrombus. - Mitral valve: There was mild regurgitation. - Left atrium: The atrium was mildly to moderately dilated. - Atrial septum: No defect or patent foramen ovale was identified. - Pulmonic valve: There was mild regurgitation.  Impressions:  - Echo contrast used to define wall motion and exclude thrombus,   but still technically difficult study. EF  appears severely   reduced with diffuse hypokinesis, base appears to have slightly   better function than apex.  Patient Profile     62 y.o. 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  Assessment & Plan    1. VF cardiac arrest: intubated. Bradycardic on telemetry, unclear if severe atrial bradycardia vs junctional rhythm. Respond to stimulation but does not open eyes. Further cardiac workup depend on degree of neurological recovery. On norepi, BP stable  2. Bradycardia: severe atrial bradycardia vs junctional rhythm on telemetry. On norepi.  3. Cardiogenic shock: on pressor, BP stable, HR around 45.   4. CAD: h/o anterior MI s/p PCI to LAD and RCA. Ischemic workup and ICD pending depending on neurological recovery  5. Ischemic cardiomyopathy: EF 35-40% in 04/2015, now 20-25% post arrest  6. Acute respiratory failure on ventilator  6. AKI: worsening renal function, baseline Cr 1.62, now Cr 4.25. He is already on norepi, will discuss with MD, unclear if improving HR will help with renal function.   7. Anoxic encephalopathy  - there was possible seizures on EEG but not definite, treated with Keppra and Depakote  8. PAF on eliquis. QTc 409 on arrival.  QTc 480 today          For questions or updates, please contact [place] Please consult [email or web address] for contact info under        Signed, Almyra Deforest, [place]  06/26/2018, 10:01 AM    I have examined the patient and reviewed assessment and plan and discussed with patient.  Agree with above as stated.  Patient with persistent bradycardia.  Unclear CNS status.  Worsening renal function.  I do not think he is a candidate for pacer at this time.  WOuld continue to manage with pressors.   Anoxic encephalopathy will determine how aggressive therapy will be going forward.  Larae Grooms

## 2018-06-26 NOTE — Progress Notes (Signed)
LTM maintenance complete; no skin break down at Fp1, PZ, F7, Chest.

## 2018-06-27 ENCOUNTER — Other Ambulatory Visit: Payer: Self-pay

## 2018-06-27 LAB — BASIC METABOLIC PANEL
Anion gap: 16 — ABNORMAL HIGH (ref 5–15)
BUN: 84 mg/dL — ABNORMAL HIGH (ref 6–20)
CO2: 25 mmol/L (ref 22–32)
CREATININE: 4.6 mg/dL — AB (ref 0.61–1.24)
Calcium: 8.2 mg/dL — ABNORMAL LOW (ref 8.9–10.3)
Chloride: 100 mmol/L (ref 98–111)
GFR calc non Af Amer: 13 mL/min — ABNORMAL LOW (ref >60)
GFR, EST AFRICAN AMERICAN: 15 mL/min — AB (ref >60)
Glucose, Bld: 117 mg/dL — ABNORMAL HIGH (ref 70–99)
Potassium: 3.4 mmol/L — ABNORMAL LOW (ref 3.5–5.1)
Sodium: 141 mmol/L (ref 135–145)

## 2018-06-27 LAB — GLUCOSE, CAPILLARY
Glucose-Capillary: 163 mg/dL — ABNORMAL HIGH (ref 70–99)
Glucose-Capillary: 106 mg/dL — ABNORMAL HIGH (ref 70–99)
Glucose-Capillary: 114 mg/dL — ABNORMAL HIGH (ref 70–99)
Glucose-Capillary: 98 mg/dL (ref 70–99)
Glucose-Capillary: 79 mg/dL (ref 70–99)

## 2018-06-27 LAB — CBC
HCT: 32.6 % — ABNORMAL LOW (ref 39.0–52.0)
Hemoglobin: 10.3 g/dL — ABNORMAL LOW (ref 13.0–17.0)
MCH: 28.9 pg (ref 26.0–34.0)
MCHC: 31.6 g/dL (ref 30.0–36.0)
MCV: 91.6 fL (ref 80.0–100.0)
Platelets: 141 10*3/uL — ABNORMAL LOW (ref 150–400)
RBC: 3.56 MIL/uL — ABNORMAL LOW (ref 4.22–5.81)
RDW: 14.4 % (ref 11.5–15.5)
WBC: 6.8 10*3/uL (ref 4.0–10.5)
nRBC: 0 % (ref 0.0–0.2)

## 2018-06-27 LAB — VALPROIC ACID LEVEL: Valproic Acid Lvl: 68 ug/mL (ref 50.0–100.0)

## 2018-06-27 MED ORDER — HEPARIN (PORCINE) 25000 UT/250ML-% IV SOLN: 1500.0000 [IU]/h | INTRAVENOUS | Status: DC

## 2018-06-27 MED ORDER — INFLUENZA VAC SPLIT QUAD 0.5 ML IM SUSY: 0.5000 mL | PREFILLED_SYRINGE | INTRAMUSCULAR | Status: DC | PRN

## 2018-06-27 MED ORDER — SODIUM CHLORIDE 0.9 % IV SOLN
1.5000 g | Freq: Two times a day (BID) | INTRAVENOUS | Status: DC
  Administered 2018-06-27 – 2018-06-29 (×4): 1.5 g via INTRAVENOUS
  Filled 2018-06-27 (×4): qty 1.5

## 2018-06-27 MED ORDER — ALBUTEROL SULFATE (2.5 MG/3ML) 0.083% IN NEBU
2.5000 mg | INHALATION_SOLUTION | Freq: Two times a day (BID) | RESPIRATORY_TRACT | Status: DC
  Administered 2018-06-27 – 2018-06-29 (×4): 2.5 mg via RESPIRATORY_TRACT
  Filled 2018-06-27 (×5): qty 3

## 2018-06-27 MED ORDER — SODIUM CHLORIDE 3 % IN NEBU
4.0000 mL | INHALATION_SOLUTION | Freq: Two times a day (BID) | RESPIRATORY_TRACT | Status: DC
  Administered 2018-06-28 (×2): 4 mL via RESPIRATORY_TRACT
  Filled 2018-06-27 (×5): qty 4

## 2018-06-27 NOTE — Progress Notes (Signed)
Subjective: EEG with some improvement  Exam: Vitals:   06/27/18 1000 06/27/18 1015  BP: (!) 144/73   Pulse: 74 66  Resp: (!) 25 (!) 22  Temp: 98.8 F (37.1 C) 98.6 F (37 C)  SpO2: 100% 100%   Gen: In bed, NAD Resp: non-labored breathing, no acute distress Abd: soft, nt  Neuro: MS: Opens eyes partially to noxious stimulus, does not follow commands. CN: Pupils equal reactive, corneals are intact Motor: he appears to be sometimes withdrawing, and sometimes extending in his UE today. to withdrawal bilateral lower extremity Sensory: As above  Pertinent Labs: Valproic acid level 68  Impression: 62 year old male with anoxic encephalopathy status post cardiac arrest.  There was a question of seizures on his EEG, though I do not think this is definite, he is being treated with Keppra and Depakote.  In any case this appears to be improving. Though not definite, I think that there may be potential for neurological recovery over the long term.   Recommendations: 1) Keppra  1 g twice daily 2)  Depakote 500 3 times daily 3) discontinue LTM EEG  Ritta SlotMcNeill Onie Kasparek, MD [facility] [phone]  If 7pm- 7am, please page neurology on call as listed in AMION. 06/27/2018  10:49 AM

## 2018-06-27 NOTE — Procedures (Signed)
LTM-EEG Report  HISTORY: Continuous video-EEG monitoring performed for 62 year old with nystagmus, possible anoxic injury.  ACQUISITION: International 10-20 system for electrode placement; 18 channels with additional eyes linked to ipsilateral ears and EKG. Additional T1-T2 electrodes were used. Continuous video recording obtained.   EEG NUMBER:  MEDICATIONS:  Day 2:  See EMR  DAY #2: from 0730 06/26/18 to 0730 06/27/18   BACKGROUND: An overall medium voltage continuous recording with poor spontaneous variability and reactivity. The background consisted of frequent theta-delta activity with sparse superimposed faster frequencies. State changes were seen, however no clear stage II sleep architecture was observed. There was increased attenuation with sleep, and periods of suppression became more apparent in the evening. EPILEPTIFORM/PERIODIC ACTIVITY: There were rare bursts and short runs of GPDs or GRDA with 1-2Hz  frequency, occurring with arousal, without clear ictal evolution. These were markedly improved over the past 24 hours. SEIZURES: No clear seizures  EVENTS: none reported  EKG: no significant arrhythmia  SUMMARY: This was an abnormal continuous video EEG due to generalized background slowing and improved runs of diffuse epileptiform discharges. This was indicative of a diffuse cerebral disturbance. Reactivity was present. No further seizures were seen.

## 2018-06-27 NOTE — Progress Notes (Addendum)
NAME:  Lawrence Delgado, MRN:  [id number], DOB:  06/15/1956, LOS: 4 ADMISSION DATE:  07-17-18, CONSULTATION DATE:  July 17, 2018 REFERRING MD:  Dr. Charm Barges, CHIEF COMPLAINT:  Cardiac Arrest    Brief History   62 y/o M who presented to [facility] on 11/26 after suffering a cardiac arrest.    Estimated time to ROSC 30 minutes.  Past Medical History  OSA, Obesity, MI, x5 stents, HTN, CHF, CAD, HLD, AF on Eliquis GERD DM   Significant Hospital Events   11/26 Admit started on normothermia, not candidate for hypothermia due to prolonged downtime and fever norepinephrine and vasopressin initiated due to cardiogenic shock, EEG ordered , Seen by cardiology noted to having underlying ischemic cardiomyopathy 11/27: Periodic discharges with triphasic morphology, concerning for possible generalized hypoxic injury 11/28: Neurology consultation without acute abnormality.Started on anticonvulsants for concerns for possible seizures 11/29: No improvement neurological exam.  Weaning pressors, serum creatinine climbing.  May DO NOT RESUSCITATE with no further escalation, family informed he is not a dialysis candidate 11/30: creatinine continues to rise and UOP decreasing, EEG with diffuse slowing but improved epileptiform changes. NE weaned to 2. Consults:  Neurology Cardiology  Procedures:  ETT 11/26 >>  L IJ TLC 11/26 >>   Significant Diagnostic Tests:  11/26  Admit after cardiac arrest 11/27 MRI brain: 1. Mild motion artifact.2. No acute intracranial abnormality identified. 3. Moderate age advanced volume loss of the brain. Micro Data:  BCx2 11/26 >> NGTD Tracheal Aspirate 11/26 >>   Antimicrobials:  Unasyn 11/26 >>    Interim history/subjective:  No acute events. Remains unresponsive. Weaning NE. Family at bedside.  Objective   Blood pressure (!) 144/73, pulse 66, temperature 98.6 F (37 C), resp. rate (!) 22, height 6\' 1"  (1.854 m), weight (!) 146 kg, SpO2 100 %. CVP:  [9 mmHg-12 mmHg] 9 mmHg    Vent Mode: PRVC FiO2 (%):  [50 %] 50 % Set Rate:  [14 bmp] 14 bmp Vt Set:  [640 mL] 640 mL PEEP:  [8 cmH20] 8 cmH20 Plateau Pressure:  [22 cmH20-26 cmH20] 22 cmH20   Intake/Output Summary (Last 24 hours) at 06/27/2018 1116 Last data filed at 06/27/2018 1000 Gross per 24 hour  Intake 1680.36 ml  Output 2145 ml  Net -464.64 ml   Filed Weights   06/25/18 0500 06/26/18 0440 06/27/18 0500  Weight: (!) 139.2 kg (!) 143.2 kg (!) 146 kg    Examination: Vitals reviewed in flow sheet General: Comatose caucasian male, NAD HEENT normocephalic, atraumatic, ETT and OG in place, eyes closed Neuro: Unresponsive. Trigger breaths on vent. Pulmonary: equal chest rise, no vent dysynchrony. Cardiac: Regular rate and rhythm Abdomen: Soft non-distened Extremities: Warm and dry trace edema GU: Clear yellow urine in foley   Resolved Hospital Problem list      Assessment & Plan:   Cardiac Arrest c/b post resuscitation cardiogenic shock H/o HTN, HLD, CAD s/p stent, CHF EF now declined to 20 to 25% with global hypokinesis -CPR performed by bystanders, approximately 30+ minutes total CPR Plan telemetry monitoring Wean vasoactive drips for mean arterial pressure greater than 65, on minimal NE currentoly Keep euvolemic  Witnessed VT 11/27 and h/o  atrial fibrillation  Plan Amiodarone infusion Holding anticoagulation given concern for bleeding on admit and likely withdrawal of care in near future  Acute Hypoxemic Respiratory Failure going cardiac arrest complicated by probable aspiration pneumonia  Plan continuing full ventilator support Currently on minimal vent settings PAD protocol, RASS goal 0 Day #5 of 7  Unasyn VAP bundle  Acute Anoxic/Metabolic Encephalopathy w/ possible seizures on cont EEG -post arrest  Plan Neuro following, appreciate assistance Keppra and VPA per neuro No significant improvement currently, but is triggering breaths on ventilator  AKI -Serum creatinine  continues to climb, urine output diminished today. Plan Avoid hypotension Renal dose medications Strict intake output No plan for dialysis after discussion with family  Fluid and Electrolyte imbalance: hypokalemia  Plan Monitor and replace PRN  GERD Plan H2B  DM w/ hyperglycemia  Plan ssi w/ basal dosing   Family updated bedside  Best practice:  Diet: TF Pain/Anxiety/Delirium protocol (if indicated): PRN fentanyl / versed  VAP protocol (if indicated): ordered  DVT prophylaxis: SCD's [place] heparin for now GI prophylaxis: Pepcid  Glucose control: SSI  Mobility: bedrest  Code Status: DNR Family Communication: Long discussion about prognosis with wife on 11/29.  Plan will be to continue supportive care over the weekend, confirmed DO NOT RESUSCITATE.  If no significant clinical improvement by Monday family anticipating transitioning to comfort approach.  The patient clearly defined prior to his illness he would not want to live in skilled nursing facility nor be supported indefinitely if there is no hope for recovery. Family updated again 11/30.  Disposition: Remains critically ill, titrating hemodynamic drips, continuing supportive care.  We will continue current therapy over the course of the weekend and revisit neurological progress as of Monday. Discussed again today poor prognosis with multisystem organ dysfunction and likelihood that he will not survive this hospitalization. Family acknowledged understanding.  Lawrence Delgado Lawrence Castillo, MD PCCM

## 2018-06-27 NOTE — Progress Notes (Addendum)
ANTICOAGULATION CONSULT NOTE - Initial Consult  Pharmacy Consult for Heparin  Indication: atrial fibrillation  Allergies  Allergen Reactions  . Codeine Nausea And Vomiting  . Clopidogrel Bisulfate Itching and Rash    Patient Measurements: Height: 6' 1" (185.4 cm) Weight: (!) 321 lb 14 oz (146 kg) IBW/kg (Calculated) : 79.9 HEPARIN DW (KG): 107.4   Vital Signs: Temp: 98.6 F (37 C) (11/30 1015) BP: 104/51 (11/30 1157) Pulse Rate: 56 (11/30 1157)  Labs: Recent Labs    06/25/18 0440 06/25/18 1629 06/26/18 0437 06/27/18 0958  HGB 11.6*  --   --   --   HCT 34.0*  --   --   --   PLT 143*  --   --   --   CREATININE 3.32* 3.68* 4.25* 4.60*    Estimated Creatinine Clearance: 25.7 mL/min (A) (by C-G formula based on SCr of 4.6 mg/dL (H)).   Medical History: Past Medical History:  Diagnosis Date  . Arthritis    " In my knees   . Atrial fibrillation ([place]) 02/2017  . CAD (coronary artery disease)   . CHF (congestive heart failure) ([place])   . 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 GERD (gastroesophageal reflux disease)   . HLD (hyperlipidemia)   . HTN (hypertension)   . MI (myocardial infarction) ([place])   . Obesity   . Sleep apnea     Medications:  Scheduled:  . chlorhexidine gluconate (MEDLINE KIT)  15 mL Mouth Rinse BID  . famotidine  20 mg Per Tube Daily  . feeding supplement (PRO-STAT SUGAR FREE 64)  60 mL Per Tube BID  . insulin aspart  0-15 Units Subcutaneous Q4H  . insulin aspart  8 Units Subcutaneous Q4H  . insulin detemir  25 Units Subcutaneous BID  . mouth rinse  15 mL Mouth Rinse 10 times per day   Infusions:  . sodium chloride    . sodium chloride 250 mL (06/24/2018 1332)  . sodium chloride 10 mL/hr at 06/27/18 0930  . sodium chloride    . amiodarone 30 mg/hr (06/27/18 1000)  . ampicillin-sulbactam (UNASYN) IV    . feeding supplement (VITAL HIGH PROTEIN) 1,000 mL (06/27/18 0842)  . levETIRAcetam Stopped (06/27/18 0538)  .  norepinephrine (LEVOPHED) Adult infusion Stopped (06/27/18 1130)  . valproate sodium 55 mL/hr at 06/27/18 1000    Assessment: Pt is a 82yoM with a history of atrial fibrillation, on apixaban PTA. Last dose 11/26. Do not anticipate Eliquis will still affect HL. Pharmacy consulted to start heparin. CBC not obtained today, will order one now to ensure Hb has not dropped.  Goal of Therapy:  Heparin level 0.3-0.7 units/ml APTT 66-102 Monitor platelets by anticoagulation protocol: Yes   Plan:  Start heparin infusion at 1500 units/hr Check anti-Xa/aPTT level in 8 hours and daily while on heparin Continue to monitor H&H and platelets   Claiborne Billings, PharmD PGY2 Cardiology Pharmacy Resident Phone [phone] Please check AMION for all Pharmacist numbers by unit 06/27/2018 1:02 PM

## 2018-06-27 NOTE — Progress Notes (Signed)
LTM discontinued; skin breakdown at C3 and T5; notified the nurse. Dr Dorthea CoveSass notified as well of discontinuation.

## 2018-06-27 NOTE — Progress Notes (Signed)
Pharmacy Antibiotic Note  Marchelle GearingJoseph R Harbin is a 62 y.o. male admitted on 11-20-17 with pneumonia.  Pharmacy has been consulted for Unasyn dosing. Afebrile, and WBC decreased  26.4>10.6. Renal function continues to decline, SCr 1.93 >> 3.32>4.6.  Plan: Decrease Unasyn 1.5gm IV Q12H F/u renal fxn, C&S, clinical status   Height: 6\' 1"  (185.4 cm) Weight: (!) 321 lb 14 oz (146 kg) IBW/kg (Calculated) : 79.9  Temp (24hrs), Avg:98.3 F (36.8 C), Min:97 F (36.1 C), Max:99 F (37.2 C)  Recent Labs  Lab Dec 26, 2017 1259  Dec 26, 2017 1305  Dec 26, 2017 1650 Dec 26, 2017 2042  06/24/18 0407  06/24/18 1400 06/25/18 0440 06/25/18 1629 06/26/18 0437 06/27/18 0958  WBC 26.4*  --   --   --   --  21.4*  --  18.3*  --   --  10.6*  --   --   --   CREATININE 1.99*   < >  --    < >  --   --    < > 2.85*   < > 2.91* 3.32* 3.68* 4.25* 4.60*  LATICACIDVEN  --   --  7.70*  --  4.78*  --   --   --   --   --   --   --   --   --    < > = values in this interval not displayed.    Estimated Creatinine Clearance: 25.7 mL/min (A) (by C-G formula based on SCr of 4.6 mg/dL (H)).    Allergies  Allergen Reactions  . Codeine Nausea And Vomiting  . Clopidogrel Bisulfate Itching and Rash    Antimicrobials this admission: Unasyn 11/26>> (12/3) Zosyn x 1 11/26  Dose adjustments this admission: 11/28 Decr 3g q8h 11/30 Decr 1.5 q12h  Microbiology results: 11/26 BCx: NGx4 days 11/26 MRSA: negative  Thank you for allowing pharmacy to be a part of this patient's care.  Marcelino FreestoneEmily , PharmD PGY2 Cardiology Pharmacy Resident Phone [phone] Please check AMION for all Pharmacist numbers by unit 06/27/2018 12:48 PM

## 2018-06-27 NOTE — Progress Notes (Signed)
Progress Note  Patient Name: Lawrence Delgado Date of Encounter: 06/27/2018  Primary Cardiologist: Kirk Ruths, MD   Subjective   Response to painful stimuli.  Does not open eyes.  Inpatient Medications    Scheduled Meds: . chlorhexidine gluconate (MEDLINE KIT)  15 mL Mouth Rinse BID  . famotidine  20 mg Per Tube Daily  . feeding supplement (PRO-STAT SUGAR FREE 64)  60 mL Per Tube BID  . insulin aspart  0-15 Units Subcutaneous Q4H  . insulin aspart  8 Units Subcutaneous Q4H  . insulin detemir  25 Units Subcutaneous BID  . mouth rinse  15 mL Mouth Rinse 10 times per day   Continuous Infusions: . sodium chloride    . sodium chloride 250 mL (06/27/2018 1332)  . sodium chloride 20 mL/hr at 06/26/18 0800  . sodium chloride    . amiodarone 30 mg/hr (06/26/18 2305)  . ampicillin-sulbactam (UNASYN) IV 3 g (06/27/18 0829)  . feeding supplement (VITAL HIGH PROTEIN) 1,000 mL (06/27/18 0842)  . lactated ringers Stopped (06/25/18 1713)  . levETIRAcetam 1,000 mg (06/27/18 0523)  . norepinephrine (LEVOPHED) Adult infusion 2 mcg/min (06/27/18 0859)  . phenylephrine (NEO-SYNEPHRINE) Adult infusion 100 mcg/min (06/24/18 0720)  . valproate sodium 500 mg (06/27/18 0114)  . vasopressin (PITRESSIN) infusion - *FOR SHOCK* Stopped (06/25/18 0422)   PRN Meds: Place/Maintain arterial line **AND** sodium chloride, acetaminophen, docusate, ondansetron (ZOFRAN) IV   Vital Signs    Vitals:   06/27/18 0815 06/27/18 0830 06/27/18 0845 06/27/18 0900  BP:      Pulse: 69 70 64 75  Resp: (!) 22 (!) 22 (!) 21 (!) 28  Temp: 98.8 F (37.1 C) 99 F (37.2 C) 99 F (37.2 C) 99 F (37.2 C)  TempSrc:      SpO2: 100% 100% 100% 100%  Weight:      Height:        Intake/Output Summary (Last 24 hours) at 06/27/2018 0925 Last data filed at 06/27/2018 0700 Gross per 24 hour  Intake 737.12 ml  Output 2270 ml  Net -1532.88 ml   Filed Weights   06/25/18 0500 06/26/18 0440 06/27/18 0500  Weight: (!)  139.2 kg (!) 143.2 kg (!) 146 kg    Telemetry    Sinus bradycardia- Personally Reviewed  ECG    Sinus rhythm- Personally Reviewed  Physical Exam   GEN: Intubated, not sedated HEENT: normal  Neck: no JVD, carotid bruits, or masses Cardiac: RRR; no murmurs, rubs, or gallops,no edema  Respiratory:  clear to auscultation bilaterally, normal work of breathing GI: soft, nondistended, + BS MS: no deformity or atrophy  Skin: warm and dry Neuro:  Strength and sensation are intact Psych: Unable to assess  Labs    Chemistry Recent Labs  Lab 06/19/2018 1259  06/25/18 0440 06/25/18 1629 06/26/18 0437  NA 138   < > 136 138 138  K 2.2*   < > 3.1* 3.3* 3.4*  CL 95*   < > 102 103 103  CO2 30   < > '23 23 23  ' GLUCOSE 398*   < > 146* 146* 181*  BUN 11   < > 37* 47* 58*  CREATININE 1.99*   < > 3.32* 3.68* 4.25*  CALCIUM 8.0*   < > 7.7* 7.8* 8.1*  PROT 6.8  --   --   --  5.7*  ALBUMIN 2.8*  --   --   --  2.1*  AST 50*  --   --   --  44*  ALT 18  --   --   --  59*  ALKPHOS 54  --   --   --  42  BILITOT 0.9  --   --   --  0.7  GFRNONAA 35*   < > 19* 17* 14*  GFRAA 41*   < > 22* 20* 16*  ANIONGAP 13   < > '11 12 12   ' < > = values in this interval not displayed.     Hematology Recent Labs  Lab 06/22/2018 2042 06/24/18 0407 06/25/18 0440  WBC 21.4* 18.3* 10.6*  RBC 4.58 4.40 3.88*  HGB 13.6 13.2 11.6*  HCT 40.1 38.0* 34.0*  MCV 87.6 86.4 87.6  MCH 29.7 30.0 29.9  MCHC 33.9 34.7 34.1  RDW 12.9 12.9 13.6  PLT 273 230 143*    Cardiac Enzymes Recent Labs  Lab 06/24/18 1114  TROPONINI 1.51*    Recent Labs  Lab 06/10/2018 1301  TROPIPOC 0.07     BNPNo results for input(s): BNP, PROBNP in the last 168 hours.   DDimer No results for input(s): DDIMER in the last 168 hours.   Radiology    No results found.  Cardiac Studies   Echo 06/24/2018 LV EF: 20% -   25% Study Conclusions  - Left ventricle: The cavity size was normal. Systolic function was   severely  reduced. The estimated ejection fraction was in the   range of 20% to 25%. Diffuse hypokinesis. Although no diagnostic   regional wall motion abnormality was identified, this possibility   cannot be completely excluded on the basis of this study.   Features are consistent with a pseudonormal left ventricular   filling pattern, with concomitant abnormal relaxation and   increased filling pressure (grade 2 diastolic dysfunction).   Acoustic contrast opacification revealed no evidence ofthrombus. - Mitral valve: There was mild regurgitation. - Left atrium: The atrium was mildly to moderately dilated. - Atrial septum: No defect or patent foramen ovale was identified. - Pulmonic valve: There was mild regurgitation.  Impressions:  - Echo contrast used to define wall motion and exclude thrombus,   but still technically difficult study. EF appears severely   reduced with diffuse hypokinesis, base appears to have slightly   better function than apex.  Patient Profile     62 y.o. 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  Assessment & Plan    1. VF cardiac arrest: Currently intubated with minimal response to the midline.  Patient continues to not open his eyes.  At this point, would hold off on further cardiac work-up pending neuro recovery.  Should he recover, he Lenita Peregrina need a left heart catheterization as well as likely an ICD.  2. Bradycardia: Has certainly had significant sinus bradycardia and junctional bradycardia.  He is in sinus rhythm currently with heart rates in the 50s to 70s.  3. Cardiogenic shock: Currently on pressors.  Blood pressure stable.  Heart rate in the 50s.  4. CAD: History of anterior MI status post PCI to the LAD and RCA.  We Terrika Zuver plan for ischemic work-up pending neuro recovery.      5. Ischemic cardiomyopathy: EF 20 to 25% postarrest.  6. Acute respiratory failure on ventilator  6. AKI: Creatinine significantly elevated  yesterday.  Has not been checked today.  He has had an improvement in his heart rate which may help to improve his renal function.    7. Anoxic encephalopathy  -Possible seizures on EEG.  Continue Keppra and Depakote per neurology.  8.  Paroxysmal atrial fibrillation: Continue Eliquis.          For questions or updates, please contact [place] Please consult [email or web address] for contact info under        Signed, Carmyn Hamm Meredith Leeds, MD  06/27/2018, 9:25 AM    I have examined the patient and reviewed assessment and plan and discussed with patient.  Agree with above as stated.  Patient with persistent bradycardia.  Unclear CNS status.  Worsening renal function.  I do not think he is a candidate for pacer at this time.  WOuld continue to manage with pressors.   Anoxic encephalopathy Sherron Mapp determine how aggressive therapy Shanard Treto be going forward.  Sanora Cunanan [employer]

## 2018-06-28 DIAGNOSIS — N189 Chronic kidney disease, unspecified: Secondary | ICD-10-CM

## 2018-06-28 DIAGNOSIS — G931 Anoxic brain damage, not elsewhere classified: Secondary | ICD-10-CM

## 2018-06-28 LAB — BASIC METABOLIC PANEL
Anion gap: 16 — ABNORMAL HIGH (ref 5–15)
BUN: 101 mg/dL — ABNORMAL HIGH (ref 6–20)
CO2: 22 mmol/L (ref 22–32)
Calcium: 8.1 mg/dL — ABNORMAL LOW (ref 8.9–10.3)
Chloride: 102 mmol/L (ref 98–111)
Creatinine, Ser: 4.69 mg/dL — ABNORMAL HIGH (ref 0.61–1.24)
GFR calc Af Amer: 15 mL/min — ABNORMAL LOW (ref >60)
GFR, EST NON AFRICAN AMERICAN: 13 mL/min — AB (ref >60)
Glucose, Bld: 88 mg/dL (ref 70–99)
Potassium: 3.2 mmol/L — ABNORMAL LOW (ref 3.5–5.1)
SODIUM: 140 mmol/L (ref 135–145)

## 2018-06-28 LAB — GLUCOSE, CAPILLARY
GLUCOSE-CAPILLARY: 75 mg/dL (ref 70–99)
Glucose-Capillary: 117 mg/dL — ABNORMAL HIGH (ref 70–99)
Glucose-Capillary: 76 mg/dL (ref 70–99)
Glucose-Capillary: 79 mg/dL (ref 70–99)
Glucose-Capillary: 83 mg/dL (ref 70–99)
Glucose-Capillary: 89 mg/dL (ref 70–99)
Glucose-Capillary: 96 mg/dL (ref 70–99)

## 2018-06-28 LAB — CBC
HCT: 31.3 % — ABNORMAL LOW (ref 39.0–52.0)
Hemoglobin: 10.3 g/dL — ABNORMAL LOW (ref 13.0–17.0)
MCH: 30 pg (ref 26.0–34.0)
MCHC: 32.9 g/dL (ref 30.0–36.0)
MCV: 91.3 fL (ref 80.0–100.0)
PLATELETS: 125 10*3/uL — AB (ref 150–400)
RBC: 3.43 MIL/uL — ABNORMAL LOW (ref 4.22–5.81)
RDW: 14.6 % (ref 11.5–15.5)
WBC: 6.1 10*3/uL (ref 4.0–10.5)
nRBC: 0 % (ref 0.0–0.2)

## 2018-06-28 LAB — CULTURE, BLOOD (ROUTINE X 2)
CULTURE: NO GROWTH
Culture: NO GROWTH
Special Requests: ADEQUATE
Special Requests: ADEQUATE

## 2018-06-28 MED ORDER — POTASSIUM CHLORIDE CRYS ER 20 MEQ PO TBCR: 20.0000 meq | EXTENDED_RELEASE_TABLET | Freq: Once | ORAL | Status: DC

## 2018-06-28 MED ORDER — POTASSIUM CHLORIDE 20 MEQ/15ML (10%) PO SOLN
20.0000 meq | Freq: Once | ORAL | Status: AC
  Administered 2018-06-28: 20 meq via ORAL
  Filled 2018-06-28: qty 15

## 2018-06-28 MED ORDER — LEVETIRACETAM IN NACL 500 MG/100ML IV SOLN
500.0000 mg | Freq: Two times a day (BID) | INTRAVENOUS | Status: DC
  Administered 2018-06-28 – 2018-06-29 (×2): 500 mg via INTRAVENOUS
  Filled 2018-06-28 (×2): qty 100

## 2018-06-28 NOTE — Progress Notes (Signed)
Subjective: No significant changes  Exam: Vitals:   06/28/18 0715 06/28/18 0800  BP:  138/70  Pulse: 85 76  Resp: 20 19  Temp: 99 F (37.2 C) 99 F (37.2 C)  SpO2: 100% 100%   Gen: In bed, NAD Resp: non-labored breathing, no acute distress Abd: soft, nt  Neuro: MS: Opens eyes partially to noxious stimulus, does not follow commands. CN: Pupils equal reactive, corneals are intact, roving eye movements Motor: extension to noxious stimulation in upper extremities, withdrawal bilateral lower extremity Sensory: As above  Pertinent Labs: Cr increased from 2.76 to 4.69 since 11/27  Impression: 62 year old male with anoxic encephalopathy status post cardiac arrest.  There was a question of seizures on his EEG, though I do not think this is definite, he is being treated with Keppra and Depakote.  In any case this appears to be improving. Though not definite, I think that there may be some potential for neurological recovery over the long term but with still have posturing type movements a week out, this is by no means definite.   Recommendations: 1)  decrease Keppra  To 500mg  BID(renal dosing) 2)  Depakote 500 3 times daily 3) will need to discuss wishes with family.   Ritta SlotMcNeill Salaya Holtrop, MD [facility] [phone]  If 7pm- 7am, please page neurology on call as listed in AMION. 06/28/2018  8:10 AM

## 2018-06-28 NOTE — Progress Notes (Signed)
eLink Physician-Brief Progress Note Patient Name: Lawrence GearingJoseph R Delgado DOB: 05/18/1956 MRN: [id number]   Date of Service  06/28/2018  HPI/Events of Note  K + 3.2, Patient has CKD stage 4 and does not meet criteria for nursing replacement protocol  eICU Interventions  Kcl 20 meq via NG tube x 1        [place]wo U Ogan 06/28/2018, 5:50 AM

## 2018-06-28 NOTE — Progress Notes (Signed)
NAME:  Lawrence Delgado, MRN:  [id number], DOB:  Aug 06, 1955, LOS: 5 ADMISSION DATE:  2018-07-19, CONSULTATION DATE:  2018/07/19 REFERRING MD:  Dr. Charm Barges, CHIEF COMPLAINT:  Cardiac Arrest    Brief History   62 y/o M who presented to [facility] on 11/26 after suffering a cardiac arrest.    Estimated time to ROSC 30 minutes.  Past Medical History  OSA, Obesity, MI, x5 stents, HTN, CHF, CAD, HLD, AF on Eliquis GERD DM   Significant Hospital Events   11/26 Admit started on normothermia, not candidate for hypothermia due to prolonged downtime and fever norepinephrine and vasopressin initiated due to cardiogenic shock, EEG ordered , Seen by cardiology noted to having underlying ischemic cardiomyopathy 11/27: Periodic discharges with triphasic morphology, concerning for possible generalized hypoxic injury 11/28: Neurology consultation without acute abnormality.Started on anticonvulsants for concerns for possible seizures 11/29: No improvement neurological exam.  Weaning pressors, serum creatinine climbing.  May DO NOT RESUSCITATE with no further escalation, family informed he is not a dialysis candidate 11/30: creatinine continues to rise and UOP decreasing, EEG with diffuse slowing but improved epileptiform changes. NE weaned to 2. Consults:  Neurology Cardiology  Procedures:  ETT 11/26 >>  L IJ TLC 11/26 >>   Significant Diagnostic Tests:  11/26  Admit after cardiac arrest 11/27 MRI brain: 1. Mild motion artifact.2. No acute intracranial abnormality identified. 3. Moderate age advanced volume loss of the brain. Micro Data:  BCx2 11/26 >> NGTD Tracheal Aspirate 11/26 >>   Antimicrobials:  Unasyn 11/26 >>    Interim history/subjective:  No events overnight, remains completely unresponsive Levophed demand decreasing overnight  Objective   Blood pressure 138/70, pulse 79, temperature 99 F (37.2 C), resp. rate 19, height 6\' 1"  (1.854 m), weight (!) 145.8 kg, SpO2 98 %.    Vent Mode:  PRVC FiO2 (%):  [40 %-50 %] 40 % Set Rate:  [14 bmp] 14 bmp Vt Set:  [640 mL] 640 mL PEEP:  [8 cmH20] 8 cmH20 Plateau Pressure:  [20 cmH20-25 cmH20] 25 cmH20   Intake/Output Summary (Last 24 hours) at 06/28/2018 0865 Last data filed at 06/28/2018 0800 Gross per 24 hour  Intake 1231.01 ml  Output 2810 ml  Net -1578.99 ml   Filed Weights   06/26/18 0440 06/27/18 0500 06/28/18 0500  Weight: (!) 143.2 kg (!) 146 kg (!) 145.8 kg    Examination: General: Acutely on chronically ill appearing male, unresponsive HEENT: [place]/AT, PERRL, EOM-I and MMM Neuro: Unresponsive, intact gag and respiratory drive but nothing else  Pulmonary: Coarse BS diffusely Cardiac: RRR, Nl S1/S2 and -M/R/G Abdomen: Soft, NT, ND and +BS Extremities: Trace edema, otherwise negative Skin: Intact  Resolved Hospital Problem list      Assessment & Plan:   Cardiac Arrest c/b post resuscitation cardiogenic shock H/o HTN, HLD, CAD s/p stent, CHF EF now declined to 20 to 25% with global hypokinesis -CPR performed by bystanders, approximately 30+ minutes total CPR Plan Continue tele monitoring Titrate Levophed down as able for MAP of 65 Hold further diureses  Witnessed VT 11/27 and h/o  atrial fibrillation  Plan Amiodarone infusion Holding anticoagulation given concern for bleeding on admit and likely withdrawal of care in near future  Acute Hypoxemic Respiratory Failure going cardiac arrest complicated by probable aspiration pneumonia  Plan Full vent support Hold off weaning for now, likely terminal extubation in AM Titrate o2 for sat of 88-92% PAD protocol, RASS goal 0 Day #6 of 7 Unasyn VAP bundle  Acute Anoxic/Metabolic  Encephalopathy w/ possible seizures on cont EEG -post arrest  Plan Neuro following, appreciate assistance Keppra and VPA per neuro No significant improvement currently, but is triggering breaths on ventilator  AKI -Serum creatinine continues to climb, urine output diminished  today. Plan Avoid hypotension Will not call nephrology unless he makes significant neurologic improvement given family discussion by Dr. Kearney Hardover on 11/30 Renal dose medications Strict intake output  Fluid and Electrolyte imbalance: hypokalemia  Plan Replace electrolytes as indicated BMET in AM Hold lasix  GERD Plan H2B  DM w/ hyperglycemia  Plan SSI w/ basal dosing   Family updated bedside  Best practice:  Diet: TF Pain/Anxiety/Delirium protocol (if indicated): PRN fentanyl / versed  VAP protocol (if indicated): ordered  DVT prophylaxis: SCD's [place] heparin for now GI prophylaxis: Pepcid  Glucose control: SSI  Mobility: bedrest  Code Status: DNR Family Communication: Long discussion about prognosis with wife on 11/29.  Plan will be to continue supportive care over the weekend, confirmed DO NOT RESUSCITATE.  If no significant clinical improvement by Monday family anticipating transitioning to comfort approach.  The patient clearly defined prior to his illness he would not want to live in skilled nursing facility nor be supported indefinitely if there is no hope for recovery.   The patient is critically ill with multiple organ systems failure and requires high complexity decision making for assessment and support, frequent evaluation and titration of therapies, application of advanced monitoring technologies and extensive interpretation of multiple databases.   Critical Care Time devoted to patient care services described in this note is  33  Minutes. This time reflects time of care of this signee Dr Koren BoundWesam Yacoub. This critical care time does not reflect procedure time, or teaching time or supervisory time of PA/NP/Med student/Med Resident etc but could involve care discussion time.  Alyson ReedyWesam G. Yacoub, M.D. [facility]/Critical [facility] Medicine. Pager: [phone]. After hours pager: [phone].

## 2018-06-28 DEATH — deceased

## 2018-06-29 ENCOUNTER — Inpatient Hospital Stay (HOSPITAL_COMMUNITY): Payer: Medicare Other

## 2018-06-29 DIAGNOSIS — G931 Anoxic brain damage, not elsewhere classified: Secondary | ICD-10-CM

## 2018-06-29 LAB — POCT I-STAT 3, ART BLOOD GAS (G3+)
Bicarbonate: 24.3 mmol/L (ref 20.0–28.0)
O2 Saturation: 98 %
Patient temperature: 37.3
TCO2: 25 mmol/L (ref 22–32)
pCO2 arterial: 38.6 mmHg (ref 32.0–48.0)
pH, Arterial: 7.408 (ref 7.350–7.450)
pO2, Arterial: 104 mmHg (ref 83.0–108.0)

## 2018-06-29 LAB — CBC
HCT: 30.4 % — ABNORMAL LOW (ref 39.0–52.0)
Hemoglobin: 9.8 g/dL — ABNORMAL LOW (ref 13.0–17.0)
MCH: 29.7 pg (ref 26.0–34.0)
MCHC: 32.2 g/dL (ref 30.0–36.0)
MCV: 92.1 fL (ref 80.0–100.0)
Platelets: 120 10*3/uL — ABNORMAL LOW (ref 150–400)
RBC: 3.3 MIL/uL — ABNORMAL LOW (ref 4.22–5.81)
RDW: 14.9 % (ref 11.5–15.5)
WBC: 7.1 10*3/uL (ref 4.0–10.5)
nRBC: 0 % (ref 0.0–0.2)

## 2018-06-29 LAB — BASIC METABOLIC PANEL
Anion gap: 15 (ref 5–15)
BUN: 118 mg/dL — ABNORMAL HIGH (ref 6–20)
CHLORIDE: 105 mmol/L (ref 98–111)
CO2: 25 mmol/L (ref 22–32)
CREATININE: 4.72 mg/dL — AB (ref 0.61–1.24)
Calcium: 8.2 mg/dL — ABNORMAL LOW (ref 8.9–10.3)
GFR calc Af Amer: 14 mL/min — ABNORMAL LOW (ref >60)
GFR calc non Af Amer: 12 mL/min — ABNORMAL LOW (ref >60)
Glucose, Bld: 140 mg/dL — ABNORMAL HIGH (ref 70–99)
Potassium: 3.5 mmol/L (ref 3.5–5.1)
SODIUM: 145 mmol/L (ref 135–145)

## 2018-06-29 LAB — PHOSPHORUS: Phosphorus: 6.4 mg/dL — ABNORMAL HIGH (ref 2.5–4.6)

## 2018-06-29 LAB — GLUCOSE, CAPILLARY
Glucose-Capillary: 108 mg/dL — ABNORMAL HIGH (ref 70–99)
Glucose-Capillary: 117 mg/dL — ABNORMAL HIGH (ref 70–99)
Glucose-Capillary: 129 mg/dL — ABNORMAL HIGH (ref 70–99)

## 2018-06-29 LAB — MAGNESIUM: Magnesium: 2.3 mg/dL (ref 1.7–2.4)

## 2018-06-29 MED ORDER — POLYVINYL ALCOHOL 1.4 % OP SOLN: 1.0000 [drp] | Freq: Four times a day (QID) | OPHTHALMIC | Status: DC | PRN

## 2018-06-29 MED ORDER — SODIUM CHLORIDE 0.9% FLUSH: 10.0000 mL | INTRAVENOUS | Status: DC | PRN

## 2018-06-29 MED ORDER — MORPHINE SULFATE (PF) 2 MG/ML IV SOLN
2.0000 mg | INTRAVENOUS | Status: DC | PRN
  Administered 2018-06-29 (×2): 4 mg via INTRAVENOUS
  Filled 2018-06-29 (×2): qty 2

## 2018-06-29 MED ORDER — GLYCOPYRROLATE 0.2 MG/ML IJ SOLN: 0.2000 mg | INTRAMUSCULAR | Status: DC | PRN

## 2018-06-29 MED ORDER — GLYCOPYRROLATE 1 MG PO TABS
1.0000 mg | ORAL_TABLET | ORAL | Status: DC | PRN
  Filled 2018-06-29: qty 1

## 2018-06-29 MED ORDER — SODIUM CHLORIDE 0.9% FLUSH: 10.0000 mL | Freq: Two times a day (BID) | INTRAVENOUS | Status: DC

## 2018-06-29 MED ORDER — ACETAMINOPHEN 650 MG RE SUPP: 650.0000 mg | Freq: Four times a day (QID) | RECTAL | Status: DC | PRN

## 2018-06-29 MED ORDER — CHLORHEXIDINE GLUCONATE CLOTH 2 % EX PADS
6.0000 | MEDICATED_PAD | Freq: Every day | CUTANEOUS | Status: DC
  Administered 2018-06-29: 6 via TOPICAL

## 2018-06-29 MED ORDER — DIPHENHYDRAMINE HCL 50 MG/ML IJ SOLN: 25.0000 mg | INTRAMUSCULAR | Status: DC | PRN

## 2018-06-29 MED ORDER — DEXTROSE 5 % IV SOLN: INTRAVENOUS | Status: DC

## 2018-06-29 MED ORDER — ACETAMINOPHEN 325 MG PO TABS: 650.0000 mg | ORAL_TABLET | Freq: Four times a day (QID) | ORAL | Status: DC | PRN

## 2018-06-29 MED ORDER — MORPHINE 100MG IN NS 100ML (1MG/ML) PREMIX INFUSION
1.0000 mg/h | INTRAVENOUS | Status: DC
  Administered 2018-06-29: 2 mg/h via INTRAVENOUS
  Filled 2018-06-29: qty 100

## 2018-07-01 ENCOUNTER — Ambulatory Visit: Payer: Medicare Other | Admitting: Cardiology

## 2018-07-01 ENCOUNTER — Encounter

## 2018-07-03 ENCOUNTER — Ambulatory Visit: Payer: Medicare Other | Admitting: Pulmonary Disease

## 2018-07-10 ENCOUNTER — Ambulatory Visit (HOSPITAL_COMMUNITY): Payer: Medicare Other

## 2018-07-13 ENCOUNTER — Telehealth: Payer: Self-pay | Admitting: *Deleted

## 2018-07-13 NOTE — Telephone Encounter (Signed)
Received D/C from [facility]-D/C forwarded to Dr.Yacoub to sign

## 2018-07-14 NOTE — Telephone Encounter (Signed)
D/C Signed-[facility] called for pick up.

## 2018-07-29 NOTE — Progress Notes (Signed)
All lines and tubes d/c'd Body prepared for morgue. Post mortem checklist complete.

## 2018-07-29 NOTE — Death Summary Note (Signed)
DEATH SUMMARY   Patient Details  Name: Lawrence Delgado MRN: [id number] DOB: 1956-05-08  Admission/Discharge Information   Admit Date:  07/05/2018  Date of Death: Date of Death: 07-11-2018  Time of Death: Time of Death: 1216/10/09  Length of Stay: October 14, 2022  Referring Physician: Octavio Graves, DO   Reason(s) for Hospitalization  Pulseless electrical activity cardiac arrest  Diagnoses  Preliminary cause of death:   Pulseless electrical activity cardiac arrest  Secondary Diagnoses (including complications and co-morbidities):  Active Problems:   Cardiac arrest [facility])   Community acquired pneumonia of right lung   Acute respiratory failure with hypoxemia (HCC)   Shock circulatory (HCC)   Hypokalemia   Coronary artery disease involving native coronary artery of native heart with angina pectoris (HCC)   Anoxic encephalopathy (HCC)   Chronic kidney disease Cardiogenic shock Acute respiratory failure hypoxemia  Brief Hospital Course (including significant findings, care, treatment, and services provided and events leading to death)  63 y/o M who presented to [facility] on 07-06-23 after suffering a cardiac arrest.    The patient was apparently at his PCP office in [facility] for blood work when he collapsed and was pulseless / apneic.  CPR was initiated by office staff.  CPR was completed for approximately 30 minutes.  On EMS arrival, he was in VF and required shock x1.  He had multiple arrests mixed with ROSC.  The patient was placed on the [place] device.  He was intubated per EMS.  He was given 3L total IVF.   While in ER, he had further PEA arrest.  The patient was stabilized in the ER.  Initial evaluation notable for CXR with dense right sided airspace disease.  Bloody secretions were noted from ETT.  ABG 7.329 / 58 / 49 / 30.  Na 134, K 5.1 (one drawn 4 minutes before as 2.2, repeat pending), Cl 95, glucose 375, BUN 16, Sr 1.70, troponin 0.07, lactic acid 7.70, WBC 26.4, Hgb 15.1, and platelets 312.     PCCM called for ICU admission.   Patient failed to make any improvement, I met with the family on 12/1 and decided to make patient a full DNR pending discussion with neurology.  Patient was seen by Dr. Rory Percy who relayed poor prognosis.  Family met further with Dr. Lynetta Mare who discussed the case with them and decision was made to make the patient a full DNR and to extubate to comfort for the patient to expire shortly thereafter.     Pertinent Labs and Studies  Significant Diagnostic Studies Ct Head Wo Contrast  Result Date: 2018/07/05 CLINICAL DATA:  Altered level of consciousness. EXAM: CT HEAD WITHOUT CONTRAST TECHNIQUE: Contiguous axial images were obtained from the base of the skull through the vertex without intravenous contrast. COMPARISON:  None. FINDINGS: Brain: Mild atrophy. Negative for hydrocephalus. Negative for acute infarct, hemorrhage, mass Vascular: Negative for hyperdense vessel Skull: Negative Sinuses/Orbits: Patient is intubated. Paranasal sinuses clear. Normal orbit. Other: None IMPRESSION: No acute intracranial abnormality. Electronically Signed   By: Franchot Gallo M.D.   On: 07/05/18 17:22   Ct Chest Wo Contrast  Result Date: 05-Jul-2018 CLINICAL DATA:  63 y/o  M; pulseless collapse with apnea post CPR. EXAM: CT CHEST WITHOUT CONTRAST TECHNIQUE: Multidetector CT imaging of the chest was performed following the standard protocol without IV contrast. COMPARISON:  04/03/2017 CT chest. 07-05-18 chest radiograph. FINDINGS: Cardiovascular: Normal heart size. No pericardial effusion. Normal caliber thoracic aorta and main pulmonary artery. Coronary artery calcific atherosclerosis with  RCA and LAD stents. Mild aortic calcific atherosclerosis. Left central venous catheter tip extends to upper SVC. Mediastinum/Nodes: No enlarged mediastinal or axillary lymph nodes. Thyroid gland, trachea, and esophagus demonstrate no significant findings. Lungs/Pleura: Moderate bilateral pleural  effusions. Diffuse consolidation greatest in right upper and left lower lobes. No pneumothorax. Upper Abdomen: Enteric tube tip extends to the gastric body. Musculoskeletal: Left 2-7 acute nondisplaced mildly angulated left anterior rib fractures. 5-7 mildly displaced acute right lateral rib fracture. IMPRESSION: 1. Diffuse pulmonary consolidation greatest in right upper lobe and left lower lobe may represent multifocal pneumonia, pulmonary edema, or ARDS. 2. Moderate bilateral pleural effusions. 3. Left 2-7 nondisplaced mildly angulated left anterior rib fractures. 5-7 mildly displaced acute right lateral rib fracture. 4. Coronary artery and aortic calcific Electronically Signed   By: Kristine Garbe M.D.   On: 06/04/2018 17:31   Mr Brain Wo Contrast  Result Date: 06/24/2018 CLINICAL DATA:  63 y/o M; pulseless apneic cardiac arrest, proximally 30 minutes CPR, multiple additional arrest with ROSC. Encephalopathy. EXAM: MRI HEAD WITHOUT CONTRAST TECHNIQUE: Multiplanar, multiecho pulse sequences of the brain and surrounding structures were obtained without intravenous contrast. COMPARISON:  None. FINDINGS: Brain: No acute infarction, hemorrhage, hydrocephalus, extra-axial collection or mass lesion. Single nonspecific focus of T2 FLAIR hyperintense signal abnormality is present in right frontal periventricular white matter of unlikely significance for age. Moderate diffuse volume loss of the brain. Punctate focus of chronic microhemorrhage in right frontal periventricular white matter. Vascular: Normal flow voids. Skull and upper cervical spine: Normal marrow signal. Sinuses/Orbits: Negative. Other: None. IMPRESSION: 1. Mild motion artifact. 2. No acute intracranial abnormality identified. 3. Moderate age advanced volume loss of the brain. Electronically Signed   By: Kristine Garbe M.D.   On: 06/24/2018 17:30   Dg Chest Port 1 View  Result Date: 07/12/18 CLINICAL DATA:  Endotracheal tube  present, respiratory failure. EXAM: PORTABLE CHEST 1 VIEW COMPARISON:  06/25/2018 and CT chest 06/07/2018. FINDINGS: Endotracheal tube terminates 5.3 cm above the carina. Nasogastric tube is followed into the stomach with the tip projecting beyond the inferior margin of the image. Left IJ central line tip projects over the SVC. Heart is enlarged, stable. Mixed interstitial and airspace opacification, left greater than right, slightly improved from 06/25/2018, specially in the right lung. There may be a left pleural effusion. IMPRESSION: 1. Mixed interstitial and airspace disease, slightly improved from 06/25/2018. Pulmonary edema is favored. Pneumonia cannot be excluded. 2. Probable left pleural effusion. Electronically Signed   By: Lorin Picket M.D.   On: 07-12-18 07:59   Dg Chest Port 1 View  Result Date: 06/25/2018 CLINICAL DATA:  Respiratory failure EXAM: PORTABLE CHEST 1 VIEW COMPARISON:  06/24/2018 FINDINGS: Cardiac shadow remains enlarged. Endotracheal tube, nasogastric catheter and left jugular central line are again seen. Bibasilar atelectasis is noted. Continued improving aeration is noted within both lungs. Some mild residual vascular congestion remains. IMPRESSION: Tubes and lines as described above. Bibasilar opacities with overall improved aeration bilaterally. Electronically Signed   By: Inez Catalina M.D.   On: 06/25/2018 09:42   Dg Chest Port 1 View  Result Date: 06/24/2018 CLINICAL DATA:  Respiratory failure, history of cardiac arrest EXAM: PORTABLE CHEST 1 VIEW COMPARISON:  06/10/2018 FINDINGS: Cardiac shadow remains enlarged. Left jugular central line endotracheal tube and nasogastric catheter are again seen. The effusion seen previously are not as well appreciated due to the patient positioning. Diffuse bilateral parenchymal opacities are noted worst in the bases bilaterally. The upper lobes show better aeration  although some persistent alveolar density remains. These changes  likely represented diffuse edema with some slight improvement. IMPRESSION: Improved aeration in the upper lobes when compared with the prior exam. Persistent bibasilar changes are noted. Tubes and lines as described. Electronically Signed   By: Inez Catalina M.D.   On: 06/24/2018 09:00   Dg Chest Port 1 View  Result Date: 06/14/2018 CLINICAL DATA:  Central catheter placement.  Hypoxia. EXAM: PORTABLE CHEST 1 VIEW COMPARISON:  June 23, 2018 study obtained earlier in the day FINDINGS: Endotracheal tube tip is 4.9 cm above the carina. Nasogastric tube tip and side port are below the diaphragm. Central catheter tip is in the superior vena cava. No pneumothorax. There is widespread airspace consolidation throughout much of the right lung, stable. There is interstitial edema with perihilar interstitial edema. There is cardiomegaly with pulmonary venous hypertension. No adenopathy. No bone lesions. IMPRESSION: Tube and catheter positions as described without pneumothorax. Pulmonary vascular congestion with perihilar interstitial edema. Extensive airspace consolidation, likely pneumonia, throughout much of the right lung. Electronically Signed   By: Lowella Grip III M.D.   On: 06/01/2018 15:47   Dg Chest Port 1 View  Result Date: 05/31/2018 CLINICAL DATA:  Post arrest. Status post endotracheal and esophagogastric tube placement. EXAM: PORTABLE CHEST 1 VIEW COMPARISON:  PA and lateral chest x-ray dated Dec 16, 2017 FINDINGS: The endotracheal tube tip lies at the inferior margin of the clavicular heads approximately 4 cm above the carina. The esophagogastric tube tip and proximal port project below the expected location of the GE junction. There is confluent airspace opacity throughout much of the right lung which is new. The left lung exhibits mildly increased interstitial markings slightly more conspicuous overall. The pulmonary vascularity is engorged and indistinct. The cardiac silhouette remains  enlarged. External pacemaker defibrillator pads are present. There are fractures of the lateral aspects of the right fifth through seventh ribs. IMPRESSION: Confluent airspace opacity throughout much of the right lung consistent with asymmetric pulmonary edema or other alveolar filling process. Coarse interstitial markings throughout the left lung without alveolar infiltrates. Mild cardiomegaly with pulmonary vascular congestion. Displaced fractures of the posterolateral aspects of the right fifth, sixth, and seventh ribs. No pneumothorax. The support tubes are in reasonable position. Electronically Signed   By: David  Martinique M.D.   On: 06/11/2018 13:28    Microbiology Recent Results (from the past 240 hour(s))  Culture, blood (routine x 2)     Status: None   Collection Time: 05/31/2018  1:40 PM  Result Value Ref Range Status   Specimen Description BLOOD LEFT ANTECUBITAL  Final   Special Requests   Final    BOTTLES DRAWN AEROBIC AND ANAEROBIC Blood Culture adequate volume   Culture   Final    NO GROWTH 5 DAYS Performed at [facility], [address], [place], [place] [address]    Report Status 06/28/2018 FINAL  Final  MRSA PCR Screening     Status: None   Collection Time: 06/11/2018  6:29 PM  Result Value Ref Range Status   MRSA by PCR NEGATIVE NEGATIVE Final    Comment:        The GeneXpert MRSA Assay (FDA approved for NASAL specimens only), is one component of a comprehensive MRSA colonization surveillance program. It is not intended to diagnose MRSA infection nor to guide or monitor treatment for MRSA infections. Performed at [facility], [place] [address], [place], [place] [address]   Culture, blood (routine x  2)     Status: None   Collection Time: 06/22/2018  7:07 PM  Result Value Ref Range Status   Specimen Description BLOOD LEFT HAND  Final   Special Requests   Final    BOTTLES DRAWN AEROBIC AND ANAEROBIC Blood Culture adequate volume   Culture   Final    NO GROWTH  5 DAYS Performed at [facility], [address], [place], [place] [address]    Report Status 06/28/2018 FINAL  Final    Lab Basic Metabolic Panel: Recent Labs  Lab 06/24/18 0407 06/24/18 1114  06/25/18 0440 06/25/18 1629 06/26/18 0437 06/27/18 0958 06/28/18 0404 July 12, 2018 0443  NA 135 132*   < > 136 138 138 141 140 145  K 2.8* 3.0*   < > 3.1* 3.3* 3.4* 3.4* 3.2* 3.5  CL 100 99   < > 102 103 103 100 102 105  CO2 22 21*   < > '23 23 23 25 22 25  ' GLUCOSE 346* 151*   < > 146* 146* 181* 117* 88 140*  BUN 20 22*   < > 37* 47* 58* 84* 101* 118*  CREATININE 2.85* 2.76*   < > 3.32* 3.68* 4.25* 4.60* 4.69* 4.72*  CALCIUM 7.6* 7.4*   < > 7.7* 7.8* 8.1* 8.2* 8.1* 8.2*  MG 2.2  --   --  1.8 2.5* 2.4  --   --  2.3  PHOS <1.0* 2.0*  --  2.4* 2.8 3.4  --   --  6.4*   < > = values in this interval not displayed.   Liver Function Tests: Recent Labs  Lab 06/26/18 0437  AST 44*  ALT 59*  ALKPHOS 42  BILITOT 0.7  PROT 5.7*  ALBUMIN 2.1*   No results for input(s): LIPASE, AMYLASE in the last 168 hours. No results for input(s): AMMONIA in the last 168 hours. CBC: Recent Labs  Lab 06/24/18 0407 06/25/18 0440 06/27/18 1313 06/28/18 0404 July 12, 2018 0443  WBC 18.3* 10.6* 6.8 6.1 7.1  HGB 13.2 11.6* 10.3* 10.3* 9.8*  HCT 38.0* 34.0* 32.6* 31.3* 30.4*  MCV 86.4 87.6 91.6 91.3 92.1  PLT 230 143* 141* 125* 120*   Cardiac Enzymes: Recent Labs  Lab 06/24/18 1114  TROPONINI 1.51*   Sepsis Labs: Recent Labs  Lab 06/25/18 0440 06/27/18 1313 06/28/18 0404 07-12-18 0443  WBC 10.6* 6.8 6.1 7.1    Procedures/Operations     Anaysha Andre 06/30/2018, 4:58 PM

## 2018-07-29 NOTE — Progress Notes (Signed)
No BP, No Pulse, No Respirations. Death pronounced at 1218 by myself and Caitlyn Chrapilwy, RN.  Dr. Denese KillingsAgarwala notified. Morphine 75mg  flushed down sink also witnessed by Hewlett-PackardCaitlyn Chrapilwy, RN. Family at bedside.

## 2018-07-29 NOTE — Procedures (Signed)
Extubation Procedure Note  Patient Details:   Name: Lawrence Delgado DOB: 1955/11/19 MRN: [id number]   Airway Documentation:    Vent end date: 07/04/2018 Vent end time: 1113   Evaluation  O2 sats: transiently fell during during procedure Complications: No apparent complications Patient did not tolerate procedure well. Bilateral Breath Sounds: Rhonchi   No   Patient was terminally extubated to room air per CCM order. Family was brought back to the room immediately following extubation.   Ernesteen Mihalic L 07/26/2018, 11:13 AM

## 2018-07-29 NOTE — Progress Notes (Signed)
Neurology Progress Note   S:// Patient seen and examined.  No new events overnight.  No change in exam per nursing.   O:// Current vital signs: BP (!) 170/62   Pulse 84   Temp 100 F (37.8 C)   Resp 20   Ht '6\' 1"'  (1.854 m)   Wt (!) 144.2 kg   SpO2 98%   BMI 41.94 kg/m  Vital signs in last 24 hours: Temp:  [98.6 F (37 C)-100.4 F (38 C)] 100 F (37.8 C) (12/02 0700) Pulse Rate:  [57-94] 84 (12/02 0750) Resp:  [13-25] 20 (12/02 0750) BP: (97-181)/(49-79) 170/62 (12/02 0750) SpO2:  [96 %-99 %] 98 % (12/02 0750) Arterial Line BP: (102-180)/(42-71) 111/43 (12/02 0700) FiO2 (%):  [40 %] 40 % (12/02 0750) Weight:  [144.2 kg] 144.2 kg (12/02 0500) General: In bed, intubated, no sedation.  In no distress Respiratory: No distress, breathing over the vent Abdomen: Soft nondistended nontender Neurological exam Mental status: On noxious stimulation, opens eyes partially but does not follow any commands.  Has roving eye movements. Cranial nerves: Pupils are equal round reactive to light, corneal reflexes are intact, roving eye movements, facial symmetry difficult to ascertain due to the endotracheal tube in place. Motor exam: Extension to noxious stim elation in bilateral upper extremities and extension admixed with some withdrawal in the lower extremities to noxious ablation. Sensory exam: As above  unable to assess gait or coordination due to his mentation.  Medications  Current Facility-Administered Medications:  .  0.9 %  sodium chloride infusion, 250 mL, Intravenous, Continuous, Hayden Rasmussen, MD .  0.9 %  sodium chloride infusion, 250 mL, Intravenous, Continuous, Hayden Rasmussen, MD, Last Rate: 10 mL/hr at 05/30/2018 1332, 250 mL at 06/18/2018 1332 .  0.9 %  sodium chloride infusion, 250 mL, Intravenous, Continuous, Ollis, Brandi L, NP, Last Rate: 10 mL/hr at Jul 20, 2018 0600 .  Place/Maintain arterial line, , , Until Discontinued **AND** 0.9 %  sodium chloride infusion, ,  Intra-arterial, PRN, Rush Farmer, MD .  acetaminophen (TYLENOL) tablet 650 mg, 650 mg, Per Tube, Q4H PRN, Ollis, Brandi L, NP .  albuterol (PROVENTIL) (2.5 MG/3ML) 0.083% nebulizer solution 2.5 mg, 2.5 mg, Nebulization, BID, Dover, Kenton L, MD, 2.5 mg at 06/28/18 2012 .  [COMPLETED] amiodarone (NEXTERONE) 1.8 mg/mL load via infusion 150 mg, 150 mg, Intravenous, Once, 150 mg at 06/24/18 0113 **FOLLOWED BY** [EXPIRED] amiodarone (NEXTERONE PREMIX) 360-4.14 MG/200ML-% (1.8 mg/mL) IV infusion, 60 mg/hr, Intravenous, Continuous, Last Rate: 33.3 mL/hr at 06/24/18 0700, 60 mg/hr at 06/24/18 0700 **FOLLOWED BY** amiodarone (NEXTERONE PREMIX) 360-4.14 MG/200ML-% (1.8 mg/mL) IV infusion, 30 mg/hr, Intravenous, Continuous, Akhter, Mohammed W, MD, Last Rate: 16.67 mL/hr at July 20, 2018 0600, 30 mg/hr at 2018-07-20 0600 .  ampicillin-sulbactam (UNASYN) 1.5 g in sodium chloride 0.9 % 100 mL IVPB, 1.5 g, Intravenous, Q12H, Rush Farmer, MD, Last Rate: 200 mL/hr at 20-Jul-2018 0747, 1.5 g at 20-Jul-2018 0747 .  chlorhexidine gluconate (MEDLINE KIT) (PERIDEX) 0.12 % solution 15 mL, 15 mL, Mouth Rinse, BID, Rush Farmer, MD, 15 mL at 07-20-18 0741 .  Chlorhexidine Gluconate Cloth 2 % PADS 6 each, 6 each, Topical, Daily, Rush Farmer, MD .  docusate (COLACE) 50 MG/5ML liquid 100 mg, 100 mg, Per Tube, BID PRN, Ollis, Brandi L, NP .  famotidine (PEPCID) 40 MG/5ML suspension 20 mg, 20 mg, Per Tube, Daily, Rush Farmer, MD, 20 mg at 06/28/18 1033 .  feeding supplement (PRO-STAT SUGAR FREE 64) liquid 60 mL,  60 mL, Per Tube, BID, Rush Farmer, MD, 60 mL at 06/28/18 2257 .  feeding supplement (VITAL HIGH PROTEIN) liquid 1,000 mL, 1,000 mL, Per Tube, Continuous, Rush Farmer, MD, Last Rate: 55 mL/hr at 06/28/18 2305, 1,000 mL at 06/28/18 2305 .  [START ON 06/30/2018] Influenza vac split quadrivalent PF (FLUARIX) injection 0.5 mL, 0.5 mL, Intramuscular, Prior to discharge, Rush Farmer, MD .  insulin aspart (novoLOG)  injection 0-15 Units, 0-15 Units, Subcutaneous, Q4H, Erick Colace, NP, 2 Units at 07-22-2018 0500 .  insulin aspart (novoLOG) injection 8 Units, 8 Units, Subcutaneous, Q4H, Erick Colace, NP, 8 Units at 06/28/18 1225 .  insulin detemir (LEVEMIR) injection 25 Units, 25 Units, Subcutaneous, BID, Erick Colace, NP, 25 Units at 06/28/18 2321 .  levETIRAcetam (KEPPRA) IVPB 500 mg/100 mL premix, 500 mg, Intravenous, Q12H, Greta Doom, MD, Stopped at 07-22-2018 [phone] .  MEDLINE mouth rinse, 15 mL, Mouth Rinse, 10 times per day, Rush Farmer, MD, 15 mL at July 22, 2018 0521 .  norepinephrine (LEVOPHED) 5m in D5W 2527mpremix infusion, 5-50 mcg/min, Intravenous, Titrated, Ollis, Brandi L, NP, Last Rate: 18.75 mL/hr at 06/28/18 1537, 5 mcg/min at 06/28/18 1537 .  ondansetron (ZOFRAN) injection 4 mg, 4 mg, Intravenous, Q6H PRN, Ollis, Brandi L, NP .  sodium chloride flush (NS) 0.9 % injection 10-40 mL, 10-40 mL, Intracatheter, Q12H, Yacoub, Wesam G, MD .  sodium chloride flush (NS) 0.9 % injection 10-40 mL, 10-40 mL, Intracatheter, PRN, YaRush FarmerMD .  sodium chloride HYPERTONIC 3 % nebulizer solution 4 mL, 4 mL, Nebulization, BID, Dover, Kenton L, MD, 4 mL at 06/28/18 2012 .  valproate (DEPACON) 500 mg in dextrose 5 % 50 mL IVPB, 500 mg, Intravenous, Q8H, YaRush FarmerMD, Stopped at 12Dec 25, 2019135 Labs Creatinine continued to worsen.   CBC     Component Value Date/Time   WBC 7.1 12December 25, 2019443   RBC 3.30 (L) 1212/25/2019443   HGB 9.8 (L) 1212-25-2019443   HCT 30.4 (L) 1212-25-2019443   PLT 120 (L) 1225-Dec-2019443   MCV 92.1 12Dec 25, 2019443   MCH 29.7 1212/25/2019443   MCHC 32.2 122019/12/25443   RDW 14.9 1212-25-2019443   LYMPHSABS 7.9 (H) 06/22/2018 1259   MONOABS 1.1 (H) 06/25/2018 1259   EOSABS 0.3 06/11/2018 1259   BASOSABS 0.0 06/17/2018 1259    CMP     Component Value Date/Time   NA 145 1225-Dec-2019443   K 3.5 122019-12-25443   CL 105 1225-Dec-2019443   CO2  25 1212-25-19443   GLUCOSE 140 (H) 12Dec 25, 2019443   BUN 118 (H) 1212/25/2019443   CREATININE 4.72 (H) 1212/25/19443   CALCIUM 8.2 (L) 122019-12-25443   PROT 5.7 (L) 06/26/2018 0437   ALBUMIN 2.1 (L) 06/26/2018 0437   AST 44 (H) 06/26/2018 0437   ALT 59 (H) 06/26/2018 0437   ALKPHOS 42 06/26/2018 0437   BILITOT 0.7 06/26/2018 0437   GFRNONAA 12 (L) 1212-25-2019443   GFRAA 14 (L) 12Dec 25, 2019443    Imaging I have reviewed images in epic and the results pertinent to this consultation are: MRI examination of the brain-06/24/2018-no acute changes.  Assessment:  [age]ear old man with anoxic encephalopathy status post cardiac arrest, continuing to exhibit no improvement in his exam-the only positives on exam being opening eyes to noxious stimulation as well as breathing over the ventilator and preserved brainstem reflexes. There was a question of seizure on his  EEG-though not definitive, for which she was started on Keppra and Depakote.  Prior assessments by my colleague Dr. Leonel Ramsay seemed to note that he was mildly improved after the antiepileptics and his assessment was that there might be some potential for neurological recovery over the long-term but still with posturing type of movements a week out that by no means is definitive. On my exam today, he continues to have the same exam as yesterday I would concur with the prior assessments that there might be potential for neurological recovery, but given the overall clinical picture with worsening renal function etc., the odds of a complete and meaningful neurological recovery are quite low at this time.  Impression: Anoxic brain injury And sent for seizure  Recommendations: Continue Keppra renal dosing Continue Depakote 500 3 times daily Discussed with the family-have been told that the family is inclined towards withdrawing care in the absence of definitive indication towards neurologically meaningful recovery.  I cannot at this  time foresee and neurologically meaningful recovery, although he has shown some improvement from the initial exam 7 days ago but the improvement has been exceptionally slow and overall clinical picture is marred by other family organ systems, which might hamper his long-term quality of life and survival by themselves.  -- Amie Portland, MD [facility] Pager: [phone] If 7pm to 7am, please call on call as listed on AMION.

## 2018-07-29 DEATH — deceased
